# Patient Record
Sex: Female | Born: 1950
Health system: Southern US, Community
[De-identification: ages and names within clinical notes are randomized; demographics above are authoritative.]

## PROBLEM LIST (undated history)

## (undated) DIAGNOSIS — I10 Essential (primary) hypertension: Secondary | ICD-10-CM

## (undated) HISTORY — PX: BREAST SURGERY: SHX581

---

## 1998-06-17 ENCOUNTER — Other Ambulatory Visit: Admission: RE | Admit: 1998-06-17 | Discharge: 1998-06-17 | Payer: Self-pay | Admitting: Obstetrics & Gynecology

## 2001-01-24 ENCOUNTER — Other Ambulatory Visit: Admission: RE | Admit: 2001-01-24 | Discharge: 2001-01-24 | Payer: Self-pay | Admitting: Obstetrics & Gynecology

## 2003-04-10 ENCOUNTER — Other Ambulatory Visit: Admission: RE | Admit: 2003-04-10 | Discharge: 2003-04-10 | Payer: Self-pay | Admitting: Obstetrics & Gynecology

## 2006-10-14 ENCOUNTER — Emergency Department (HOSPITAL_COMMUNITY): Admission: EM | Admit: 2006-10-14 | Discharge: 2006-10-14 | Payer: Self-pay | Admitting: Emergency Medicine

## 2007-03-15 ENCOUNTER — Ambulatory Visit (HOSPITAL_COMMUNITY): Admission: RE | Admit: 2007-03-15 | Discharge: 2007-03-15 | Payer: Self-pay | Admitting: Pulmonary Disease

## 2009-06-24 ENCOUNTER — Ambulatory Visit (HOSPITAL_COMMUNITY): Admission: RE | Admit: 2009-06-24 | Discharge: 2009-06-24 | Payer: Self-pay | Admitting: Family Medicine

## 2010-10-15 NOTE — Procedures (Signed)
NAME:  Jody Hernandez, Jody Hernandez                 ACCOUNT NO.:  [id number]   MEDICAL RECORD NO.:  [id number]          PATIENT TYPE:  OUT   LOCATION:  RAD                           FACILITY:  [facility]   PHYSICIAN:  Edward L. Juanetta Gosling, M.D.DATE OF BIRTH:  09-19-1950   DATE OF PROCEDURE:  03/20/2007  DATE OF DISCHARGE:  03/15/2007                            PULMONARY FUNCTION TEST   1. Spirometry shows a mild ventilatory defect with evidence of airflow      obstruction.  2. Lung volumes are normal.  3. DLCO is mildly reduced.  4. Arterial blood gases are normal.      Edward L. Juanetta Gosling, M.D.  Electronically Signed     [facility]/MEDQ  D:  03/20/2007  T:  03/20/2007  Job:  [id number]

## 2011-03-09 LAB — BLOOD GAS, ARTERIAL
Acid-Base Excess: 2.9 — ABNORMAL HIGH
FIO2: 0.21
O2 Saturation: 96.6
Patient temperature: 37
pO2, Arterial: 84.2

## 2012-04-23 ENCOUNTER — Encounter (INDEPENDENT_AMBULATORY_CARE_PROVIDER_SITE_OTHER): Payer: Self-pay | Admitting: *Deleted

## 2012-05-28 ENCOUNTER — Other Ambulatory Visit (HOSPITAL_COMMUNITY): Payer: Self-pay | Admitting: Family Medicine

## 2012-05-28 DIAGNOSIS — Z139 Encounter for screening, unspecified: Secondary | ICD-10-CM

## 2012-05-31 ENCOUNTER — Ambulatory Visit (HOSPITAL_COMMUNITY)
Admission: RE | Admit: 2012-05-31 | Discharge: 2012-05-31 | Disposition: A | Discharge status: Home / Self Care | Payer: BC Managed Care – PPO | Source: Ambulatory Visit | Attending: Family Medicine | Admitting: Family Medicine

## 2012-05-31 DIAGNOSIS — Z139 Encounter for screening, unspecified: Secondary | ICD-10-CM

## 2012-05-31 DIAGNOSIS — Z1231 Encounter for screening mammogram for malignant neoplasm of breast: Secondary | ICD-10-CM | POA: Insufficient documentation

## 2012-05-31 DIAGNOSIS — R928 Other abnormal and inconclusive findings on diagnostic imaging of breast: Secondary | ICD-10-CM | POA: Insufficient documentation

## 2014-04-23 ENCOUNTER — Other Ambulatory Visit (HOSPITAL_COMMUNITY): Payer: Self-pay | Admitting: Family Medicine

## 2014-04-23 DIAGNOSIS — Z01419 Encounter for gynecological examination (general) (routine) without abnormal findings: Secondary | ICD-10-CM

## 2014-05-01 ENCOUNTER — Other Ambulatory Visit (HOSPITAL_COMMUNITY): Payer: BC Managed Care – PPO

## 2014-05-26 ENCOUNTER — Ambulatory Visit (HOSPITAL_COMMUNITY)
Admission: RE | Admit: 2014-05-26 | Discharge: 2014-05-26 | Disposition: A | Discharge status: Home / Self Care | Payer: BC Managed Care – PPO | Source: Ambulatory Visit | Attending: Family Medicine | Admitting: Family Medicine

## 2014-05-26 DIAGNOSIS — Z13820 Encounter for screening for osteoporosis: Secondary | ICD-10-CM | POA: Diagnosis not present

## 2014-05-26 DIAGNOSIS — Z780 Asymptomatic menopausal state: Secondary | ICD-10-CM | POA: Diagnosis not present

## 2014-05-26 DIAGNOSIS — Z01419 Encounter for gynecological examination (general) (routine) without abnormal findings: Secondary | ICD-10-CM

## 2015-04-22 ENCOUNTER — Other Ambulatory Visit (HOSPITAL_COMMUNITY): Payer: Self-pay | Admitting: Family Medicine

## 2015-04-22 DIAGNOSIS — Z1231 Encounter for screening mammogram for malignant neoplasm of breast: Secondary | ICD-10-CM

## 2015-04-30 ENCOUNTER — Ambulatory Visit (HOSPITAL_COMMUNITY)
Admission: RE | Admit: 2015-04-30 | Discharge: 2015-04-30 | Disposition: A | Discharge status: Home / Self Care | Payer: Medicare Other | Source: Ambulatory Visit | Attending: Family Medicine | Admitting: Family Medicine

## 2015-04-30 DIAGNOSIS — Z1231 Encounter for screening mammogram for malignant neoplasm of breast: Secondary | ICD-10-CM | POA: Insufficient documentation

## 2015-06-11 ENCOUNTER — Encounter (INDEPENDENT_AMBULATORY_CARE_PROVIDER_SITE_OTHER): Payer: Self-pay | Admitting: *Deleted

## 2016-08-01 ENCOUNTER — Other Ambulatory Visit (HOSPITAL_COMMUNITY): Payer: Self-pay | Admitting: Family Medicine

## 2016-08-01 DIAGNOSIS — Z1231 Encounter for screening mammogram for malignant neoplasm of breast: Secondary | ICD-10-CM

## 2016-08-11 ENCOUNTER — Ambulatory Visit (HOSPITAL_COMMUNITY): Payer: BC Managed Care – PPO

## 2016-08-18 ENCOUNTER — Ambulatory Visit (HOSPITAL_COMMUNITY)
Admission: RE | Admit: 2016-08-18 | Discharge: 2016-08-18 | Disposition: A | Discharge status: Home / Self Care | Payer: Medicare Other | Source: Ambulatory Visit | Attending: Family Medicine | Admitting: Family Medicine

## 2016-08-18 DIAGNOSIS — Z1231 Encounter for screening mammogram for malignant neoplasm of breast: Secondary | ICD-10-CM | POA: Diagnosis not present

## 2016-09-01 ENCOUNTER — Encounter (INDEPENDENT_AMBULATORY_CARE_PROVIDER_SITE_OTHER): Payer: Self-pay | Admitting: *Deleted

## 2017-03-08 ENCOUNTER — Inpatient Hospital Stay (HOSPITAL_COMMUNITY)
Admission: EM | Admit: 2017-03-08 | Discharge: 2017-03-11 | DRG: 854 | Disposition: A | Discharge status: Home / Self Care | Payer: Medicare Other | Attending: Internal Medicine | Admitting: Internal Medicine

## 2017-03-08 ENCOUNTER — Encounter (HOSPITAL_COMMUNITY): Payer: Self-pay

## 2017-03-08 ENCOUNTER — Emergency Department (HOSPITAL_COMMUNITY): Payer: Medicare Other

## 2017-03-08 DIAGNOSIS — R1012 Left upper quadrant pain: Secondary | ICD-10-CM | POA: Diagnosis not present

## 2017-03-08 DIAGNOSIS — R000 Tachycardia, unspecified: Secondary | ICD-10-CM | POA: Diagnosis not present

## 2017-03-08 DIAGNOSIS — A4151 Sepsis due to Escherichia coli [E. coli]: Secondary | ICD-10-CM | POA: Diagnosis not present

## 2017-03-08 DIAGNOSIS — N138 Other obstructive and reflux uropathy: Secondary | ICD-10-CM

## 2017-03-08 DIAGNOSIS — A419 Sepsis, unspecified organism: Secondary | ICD-10-CM | POA: Diagnosis present

## 2017-03-08 DIAGNOSIS — N132 Hydronephrosis with renal and ureteral calculous obstruction: Secondary | ICD-10-CM | POA: Diagnosis not present

## 2017-03-08 DIAGNOSIS — N390 Urinary tract infection, site not specified: Secondary | ICD-10-CM

## 2017-03-08 DIAGNOSIS — I10 Essential (primary) hypertension: Secondary | ICD-10-CM | POA: Diagnosis present

## 2017-03-08 DIAGNOSIS — N200 Calculus of kidney: Secondary | ICD-10-CM

## 2017-03-08 HISTORY — DX: Other obstructive and reflux uropathy: N13.8

## 2017-03-08 HISTORY — DX: Essential (primary) hypertension: I10

## 2017-03-08 LAB — COMPREHENSIVE METABOLIC PANEL
ALK PHOS: 84 U/L (ref 38–126)
ALT: 15 U/L (ref 14–54)
ANION GAP: 9 (ref 5–15)
AST: 24 U/L (ref 15–41)
Albumin: 3.4 g/dL — ABNORMAL LOW (ref 3.5–5.0)
BILIRUBIN TOTAL: 1.3 mg/dL — AB (ref 0.3–1.2)
BUN: 22 mg/dL — ABNORMAL HIGH (ref 6–20)
CALCIUM: 9 mg/dL (ref 8.9–10.3)
CO2: 22 mmol/L (ref 22–32)
Chloride: 103 mmol/L (ref 101–111)
Creatinine, Ser: 1.08 mg/dL — ABNORMAL HIGH (ref 0.44–1.00)
GFR, EST AFRICAN AMERICAN: 60 mL/min — AB (ref >60)
GFR, EST NON AFRICAN AMERICAN: 52 mL/min — AB (ref >60)
GLUCOSE: 125 mg/dL — AB (ref 65–99)
POTASSIUM: 3.5 mmol/L (ref 3.5–5.1)
Sodium: 134 mmol/L — ABNORMAL LOW (ref 135–145)
TOTAL PROTEIN: 6.6 g/dL (ref 6.5–8.1)

## 2017-03-08 LAB — URINALYSIS, MICROSCOPIC (REFLEX)

## 2017-03-08 LAB — URINALYSIS, ROUTINE W REFLEX MICROSCOPIC
BILIRUBIN URINE: NEGATIVE
Glucose, UA: NEGATIVE mg/dL
Hgb urine dipstick: NEGATIVE
Ketones, ur: 5 mg/dL — AB
LEUKOCYTES UA: NEGATIVE
NITRITE: NEGATIVE
Protein, ur: 30 mg/dL — AB
SPECIFIC GRAVITY, URINE: 1.02 (ref 1.005–1.030)
pH: 7 (ref 5.0–8.0)

## 2017-03-08 LAB — CBC
HEMATOCRIT: 34.9 % — AB (ref 36.0–46.0)
HEMOGLOBIN: 11.6 g/dL — AB (ref 12.0–15.0)
MCH: 29.9 pg (ref 26.0–34.0)
MCHC: 33.2 g/dL (ref 30.0–36.0)
MCV: 89.9 fL (ref 78.0–100.0)
Platelets: 167 10*3/uL (ref 150–400)
RBC: 3.88 MIL/uL (ref 3.87–5.11)
RDW: 14.3 % (ref 11.5–15.5)
WBC: 15.6 10*3/uL — AB (ref 4.0–10.5)

## 2017-03-08 LAB — LACTIC ACID, PLASMA: Lactic Acid, Venous: 2.1 mmol/L (ref 0.5–1.9)

## 2017-03-08 LAB — LIPASE, BLOOD: Lipase: 27 U/L (ref 11–51)

## 2017-03-08 MED ORDER — DEXTROSE 5 % IV SOLN: 1.0000 g | Freq: Once | INTRAVENOUS | Status: DC

## 2017-03-08 MED ORDER — DEXTROSE 5 % IV SOLN
2.0000 g | Freq: Once | INTRAVENOUS | Status: AC
  Administered 2017-03-08: 2 g via INTRAVENOUS
  Filled 2017-03-08: qty 2

## 2017-03-08 MED ORDER — FENTANYL CITRATE (PF) 100 MCG/2ML IJ SOLN
50.0000 ug | Freq: Once | INTRAMUSCULAR | Status: AC
  Administered 2017-03-08: 50 ug via INTRAVENOUS
  Filled 2017-03-08: qty 2

## 2017-03-08 MED ORDER — SODIUM CHLORIDE 0.9 % IV BOLUS (SEPSIS)
1000.0000 mL | Freq: Once | INTRAVENOUS | Status: AC
  Administered 2017-03-08: 1000 mL via INTRAVENOUS

## 2017-03-08 MED ORDER — ONDANSETRON HCL 4 MG/2ML IJ SOLN
4.0000 mg | Freq: Once | INTRAMUSCULAR | Status: AC
  Administered 2017-03-08: 4 mg via INTRAVENOUS
  Filled 2017-03-08: qty 2

## 2017-03-08 MED ORDER — IOPAMIDOL (ISOVUE-300) INJECTION 61%
100.0000 mL | Freq: Once | INTRAVENOUS | Status: AC | PRN
  Administered 2017-03-08: 100 mL via INTRAVENOUS

## 2017-03-08 NOTE — ED Notes (Signed)
Date and time results received: 03/08/17 2237 (use smartphrase ".now" to insert current time)  Test: lactic acid Critical Value: 2.1  Name of Provider Notified: Dr Rubin Payor  Orders Received? Or Actions Taken?: none told not to call transfer location with results

## 2017-03-08 NOTE — ED Provider Notes (Signed)
[facility]-EMERGENCY DEPT Provider Note   CSN: [id number] Arrival date & time: 03/08/17  1531     History   Chief Complaint Chief Complaint  Patient presents with  . Abdominal Pain    HPI Jody Hernandez is a 66 y.o. female.  HPI Patient presents for abdominal pain. Left upper abdomen. Began last night. Has had some bowel movements today. Some pain with urination. Has had nausea and some vomiting. Pain is dull and constant. Decreased appetite. States she feels bad all over. She has not had pain like this before. No sick contacts. Past Medical History:  Diagnosis Date  . Hypertension     There are no active problems to display for this patient.   Past Surgical History:  Procedure Laterality Date  . BREAST SURGERY     lumpectomy    OB History    No data available       Home Medications    Prior to Admission medications   Medication Sig Start Date End Date Taking? Authorizing Provider  ibuprofen (ADVIL,MOTRIN) 200 MG tablet Take 200 mg by mouth every 6 (six) hours as needed for mild pain or moderate pain.   Yes [provider]  lisinopril (PRINIVIL,ZESTRIL) 20 MG tablet Take 20 mg by mouth daily.   Yes [provider]    Family History No family history on file.  Social History Social History  Substance Use Topics  . Smoking status: Never Smoker  . Smokeless tobacco: Never Used  . Alcohol use No     Allergies   Patient has no known allergies.   Review of Systems Review of Systems  Constitutional: Positive for appetite change and chills.  HENT: Negative for congestion.   Respiratory: Negative for shortness of breath.   Gastrointestinal: Positive for abdominal pain, nausea and vomiting.  Genitourinary: Positive for dysuria. Negative for flank pain.  Musculoskeletal: Positive for myalgias. Negative for back pain.  Skin: Negative for rash.  Neurological: Negative for seizures.  Hematological: Negative for adenopathy.    Psychiatric/Behavioral: Negative for confusion.     Physical Exam Updated Vital Signs BP 137/90   Pulse (!) 111   Temp (!) 101.6 F (38.7 C) (Rectal)   Resp (!) 24   Ht  (1.626 m)   Wt 81.6 kg (180 lb)   SpO2 93%   BMI 30.90 kg/m   Physical Exam  Constitutional: She appears well-developed.  HENT:  Head: Normocephalic.  Eyes: EOM are normal.  Neck: Neck supple.  Cardiovascular:  tachycardia  Pulmonary/Chest: No respiratory distress. She has no wheezes. She has no rales.  Abdominal: There is tenderness.  Left upper quadrant tenderness without rebound or guarding.  Musculoskeletal: She exhibits no edema.  Neurological: She is alert.  Skin: Skin is warm. Capillary refill takes less than 2 seconds.  Psychiatric: She has a normal mood and affect.     ED Treatments / Results  Labs (all labs ordered are listed, but only abnormal results are displayed) Labs Reviewed  COMPREHENSIVE METABOLIC PANEL - Abnormal; Notable for the following:       Result Value   Sodium 134 (*)    Glucose, Bld 125 (*)    BUN 22 (*)    Creatinine, Ser 1.08 (*)    Albumin 3.4 (*)    Total Bilirubin 1.3 (*)    GFR calc non Af Amer 52 (*)    GFR calc Af Amer 60 (*)    All other components within normal limits  CBC -  Abnormal; Notable for the following:    WBC 15.6 (*)    Hemoglobin 11.6 (*)    HCT 34.9 (*)    All other components within normal limits  URINALYSIS, ROUTINE W REFLEX MICROSCOPIC - Abnormal; Notable for the following:    Ketones, ur 5 (*)    Protein, ur 30 (*)    All other components within normal limits  URINALYSIS, MICROSCOPIC (REFLEX) - Abnormal; Notable for the following:    Bacteria, UA RARE (*)    Squamous Epithelial / LPF 0-5 (*)    All other components within normal limits  URINE CULTURE  CULTURE, BLOOD (ROUTINE X 2)  CULTURE, BLOOD (ROUTINE X 2)  LIPASE, BLOOD  LACTIC ACID, PLASMA  LACTIC ACID, PLASMA    EKG  EKG Interpretation  Date/Time:  Wednesday  March 08 2017 15:56:33 EDT Ventricular Rate:  111 PR Interval:    QRS Duration: 91 QT Interval:  323 QTC Calculation: 439 R Axis:   69 Text Interpretation:  Sinus tachycardia Confirmed by Benjiman Core [phone]) on 03/08/2017 4:21:04 PM       Radiology Ct Abdomen Pelvis W Contrast  Result Date: 03/08/2017 CLINICAL DATA:  Upper to lower LEFT side abdominal pain, some burning with urination EXAM: CT ABDOMEN AND PELVIS WITH CONTRAST TECHNIQUE: Multidetector CT imaging of the abdomen and pelvis was performed using the standard protocol following bolus administration of intravenous contrast. Sagittal and coronal MPR images reconstructed from axial data set. CONTRAST:  ISOVUE-300 IOPAMIDOL (ISOVUE-300) INJECTION 61% IV. No oral contrast. COMPARISON:  None FINDINGS: Lower chest: Bibasilar atelectasis Hepatobiliary: Distended gallbladder. Liver unremarkable. No biliary dilatation. Pancreas: Normal appearance Spleen: Normal appearance though mild perisplenic fluid is seen Adrenals/Urinary Tract: Adrenal glands thickened without discrete mass. Significant LEFT hydronephrosis and hydroureter terminating at a 7 mm distal LEFT ureteral calculus image 79. Significant perinephric and peripelvic edema with edema extending throughout the LEFT anterior pararenal space laterally to the proximal descending colon and perisplenic. Cannot exclude 4 axial rupture though no definite contrast extravasation is seen on delayed images. Significant delay in LEFT nephrogram consistent with impaired renal function. Tiny nonobstructing inferior pole RIGHT renal calculus. RIGHT kidney and ureter otherwise normal appearance. Bladder unremarkable. Stomach/Bowel: Redundant cecum. Normal appendix. Slight prominent stool in rectum and at the distal transverse colon/splenic flexure. Moderate-sized hiatal hernia. Stomach and bowel loops otherwise unremarkable. Vascular/Lymphatic: Aorta normal caliber. Few pelvic phleboliths. No  adenopathy. Reproductive: Unremarkable uterus and LEFT ovary. RIGHT ovarian cyst 3.4 x 3.1 cm image 75. Other: No additional ascites Musculoskeletal: Demineralized IMPRESSION: Marked LEFT hydronephrosis and hydroureter secondary to a 2 mm distal LEFT ureteral calculus. Significant perinephric edema extending into anterior pararenal space laterally to the proximal descending colon and perisplenic region, cannot exclude appendiceal rupture. Evidence of impaired LEFT renal function. Tiny nonobstructing RIGHT renal calculus. Moderate-sized hiatal hernia. Electronically Signed   By: Ulyses Southward M.D.   On: 03/08/2017 19:53    Procedures Procedures (including critical care time)  Medications Ordered in ED Medications  sodium chloride 0.9 % bolus 1,000 mL (not administered)  cefTRIAXone (ROCEPHIN) 2 g in dextrose 5 % 50 mL IVPB (not administered)  sodium chloride 0.9 % bolus 1,000 mL (0 mLs Intravenous Stopped 03/08/17 1942)  ondansetron (ZOFRAN) injection 4 mg (4 mg Intravenous Given 03/08/17 1644)  fentaNYL (SUBLIMAZE) injection 50 mcg (50 mcg Intravenous Given 03/08/17 1759)  iopamidol (ISOVUE-300) 61 % injection 100 mL (100 mLs Intravenous Contrast Given 03/08/17 1920)     Initial Impression / Assessment and  Plan / ED Course  I have reviewed the triage vital signs and the nursing notes.  Pertinent labs & imaging results that were available during my care of the patient were reviewed by me and considered in my medical decision making (see chart for details).    Patient presented with left abdominal and flank pain. Has had some tachycardia and on rectal temperature was found to have a fever. Not hypotensive. Urinalysis shows white cells but not otherwise a clear infection but CT scan shows large distal ureteral stone with hydronephrosis and possible calyceal rupture. Discussed with Dr. Sherron Monday who requested transfer to the [facility] ER for emergent stenting. He said start a code sepsis. Patient  had antibiotics given and blood cultures were added. Lactic acidadded. Also discussed with Dr. Effie Shy in the ER.   CRITICAL CARE Performed by: Billee Cashing Total critical care time: 30 minutes Critical care time was exclusive of separately billable procedures and treating other patients. Critical care was necessary to treat or prevent imminent or life-threatening deterioration. Critical care was time spent personally by me on the following activities: development of treatment plan with patient and/or surrogate as well as nursing, discussions with consultants, evaluation of patient's response to treatment, examination of patient, obtaining history from patient or surrogate, ordering and performing treatments and interventions, ordering and review of laboratory studies, ordering and review of radiographic studies, pulse oximetry and re-evaluation of patient's condition.   Final Clinical Impressions(s) / ED Diagnoses   Final diagnoses:  Urinary tract infection without hematuria, site unspecified  Ureteral stone with hydronephrosis    New Prescriptions New Prescriptions   No medications on file     Benjiman Core, MD 03/08/17 2030

## 2017-03-08 NOTE — ED Notes (Signed)
Pt requesting something for pain, EDP made aware and orders given for pain medication

## 2017-03-08 NOTE — ED Notes (Signed)
Report given to Daleen Bo at [facility] ED

## 2017-03-08 NOTE — ED Notes (Signed)
Pt's husband at bedside, given numbers for [facility] ED and [facility] hospital; waiting for [facility] arrival

## 2017-03-08 NOTE — Consult Note (Signed)
Urology Consult  Referring physician: Madison Hickman Reason for referral: septic stone  Chief Complaint: septic stone  History of Present Illness: left abdominal pain and fever; pain with urine; nausea; code sepsis started; no GU surgery stones or UTI history; pain bit more on left but diffuse lower  Cr 1/08 WBC 15.6 CT: 97m distal stone with significant hydro; much edema noted around left kidney;   Modifying factors: There are no other modifying factors  Associated signs and symptoms: There are no other associated signs and symptoms Aggravating and relieving factors: There are no other aggravating or relieving factors Severity: Moderate Duration: Persistent   Past Medical History:  Diagnosis Date  . Hypertension    Past Surgical History:  Procedure Laterality Date  . BREAST SURGERY     lumpectomy    Medications: I have reviewed the patient's current medications. Allergies: No Known Allergies  No family history on file. Social History:  reports that she has never smoked. She has never used smokeless tobacco. She reports that she does not drink alcohol or use drugs.  ROS: All systems are reviewed and negative except as noted. Rest negative  Physical Exam:  Vital signs in last 24 hours: Temp:  [99.3 F (37.4 C)-101.6 F (38.7 C)] 99.6 F (37.6 C) (10/10 2103) Pulse Rate:  [105-124] 107 (10/10 2130) Resp:  [16-28] 26 (10/10 2130) BP: (130-152)/(79-97) 152/93 (10/10 2130) SpO2:  [89 %-96 %] 96 % (10/10 2130) Weight:  [81.6 kg (180 lb)] 81.6 kg (180 lb) (10/10 1537)  Cardiovascular: Skin warm; not flushed Respiratory: Breaths quiet; no shortness of breath Abdomen: No masses Neurological: Normal sensation to touch Musculoskeletal: Normal motor function arms and legs Lymphatics: No inguinal adenopathy Skin: No rashes Genitourinary:dehydrated Benign belly  Laboratory Data:  Results for orders placed or performed during the hospital encounter of 03/08/17 (from the past 72  hour(s))  Urinalysis, Routine w reflex microscopic     Status: Abnormal   Collection Time: 03/08/17  3:40 PM  Result Value Ref Range   Color, Urine YELLOW YELLOW   APPearance CLEAR CLEAR   Specific Gravity, Urine 1.020 1.005 - 1.030   pH 7.0 5.0 - 8.0   Glucose, UA NEGATIVE NEGATIVE mg/dL   Hgb urine dipstick NEGATIVE NEGATIVE   Bilirubin Urine NEGATIVE NEGATIVE   Ketones, ur 5 (A) NEGATIVE mg/dL   Protein, ur 30 (A) NEGATIVE mg/dL   Nitrite NEGATIVE NEGATIVE   Leukocytes, UA NEGATIVE NEGATIVE  Urinalysis, Microscopic (reflex)     Status: Abnormal   Collection Time: 03/08/17  3:40 PM  Result Value Ref Range   RBC / HPF 0-5 0 - 5 RBC/hpf   WBC, UA 6-30 0 - 5 WBC/hpf   Bacteria, UA RARE (A) NONE SEEN   Squamous Epithelial / LPF 0-5 (A) NONE SEEN  Lipase, blood     Status: None   Collection Time: 03/08/17  4:08 PM  Result Value Ref Range   Lipase 27 11 - 51 U/L  Comprehensive metabolic panel     Status: Abnormal   Collection Time: 03/08/17  4:08 PM  Result Value Ref Range   Sodium 134 (L) 135 - 145 mmol/L   Potassium 3.5 3.5 - 5.1 mmol/L   Chloride 103 101 - 111 mmol/L   CO2 22 22 - 32 mmol/L   Glucose, Bld 125 (H) 65 - 99 mg/dL   BUN 22 (H) 6 - 20 mg/dL   Creatinine, Ser 1.08 (H) 0.44 - 1.00 mg/dL   Calcium 9.0 8.9 -  10.3 mg/dL   Total Protein 6.6 6.5 - 8.1 g/dL   Albumin 3.4 (L) 3.5 - 5.0 g/dL   AST 24 15 - 41 U/L   ALT 15 14 - 54 U/L   Alkaline Phosphatase 84 38 - 126 U/L   Total Bilirubin 1.3 (H) 0.3 - 1.2 mg/dL   GFR calc non Af Amer 52 (L) >60 mL/min   GFR calc Af Amer 60 (L) >60 mL/min    Comment: (NOTE) The eGFR has been calculated using the CKD EPI equation. This calculation has not been validated in all clinical situations. eGFR's persistently <60 mL/min signify possible Chronic Kidney Disease.    Anion gap 9 5 - 15  CBC     Status: Abnormal   Collection Time: 03/08/17  4:08 PM  Result Value Ref Range   WBC 15.6 (H) 4.0 - 10.5 K/uL   RBC 3.88 3.87 - 5.11  MIL/uL   Hemoglobin 11.6 (L) 12.0 - 15.0 g/dL   HCT 34.9 (L) 36.0 - 46.0 %   MCV 89.9 78.0 - 100.0 fL   MCH 29.9 26.0 - 34.0 pg   MCHC 33.2 30.0 - 36.0 g/dL   RDW 14.3 11.5 - 15.5 %   Platelets 167 150 - 400 K/uL  Lactic acid, plasma     Status: Abnormal   Collection Time: 03/08/17  8:59 PM  Result Value Ref Range   Lactic Acid, Venous 2.1 (HH) 0.5 - 1.9 mmol/L    Comment: CRITICAL RESULT CALLED TO, READ BACK BY AND VERIFIED WITH: [place],C'@2233'$  BY MATTHEWS, B 10.10.18    No results found for this or any previous visit (from the past 240 hour(s)). Creatinine:  Recent Labs  03/08/17 1608  CREATININE 1.08*    Xrays: See report/chart As above  Impression/Assessment:  Left ureter stone and fever  Plan:  Septic protocol and stent; pros and cons and risks and sequelae- perc sepsis etc  After a thorough review of the management options for the patient's condition the patient  elected to proceed with surgical therapy as noted above. We have discussed the potential benefits and risks of the procedure, side effects of the proposed treatment, the likelihood of the patient achieving the goals of the procedure, and any potential problems that might occur during the procedure or recuperation. Informed consent has been obtained.  Jody Hernandez A 03/08/2017, 11:22 PM

## 2017-03-08 NOTE — ED Notes (Signed)
Pt assisted to bathroom and back to bed, urine specimen obtained and sent to lab 

## 2017-03-08 NOTE — ED Triage Notes (Signed)
Pt c/o left sided abd pain.  Says has had several BMs today but says hasn't been diarrhea.  Pt say it burns a little when she voids.

## 2017-03-09 ENCOUNTER — Emergency Department (HOSPITAL_COMMUNITY): Payer: Medicare Other | Admitting: Registered Nurse

## 2017-03-09 ENCOUNTER — Encounter (HOSPITAL_COMMUNITY): Payer: Self-pay | Admitting: Family Medicine

## 2017-03-09 ENCOUNTER — Encounter (HOSPITAL_COMMUNITY)
Admission: EM | Disposition: A | Discharge status: Home / Self Care | Payer: Self-pay | Source: Home / Self Care | Attending: Urology

## 2017-03-09 ENCOUNTER — Inpatient Hospital Stay (HOSPITAL_COMMUNITY): Payer: Medicare Other

## 2017-03-09 DIAGNOSIS — A4151 Sepsis due to Escherichia coli [E. coli]: Secondary | ICD-10-CM | POA: Diagnosis present

## 2017-03-09 DIAGNOSIS — N132 Hydronephrosis with renal and ureteral calculous obstruction: Secondary | ICD-10-CM

## 2017-03-09 DIAGNOSIS — A419 Sepsis, unspecified organism: Secondary | ICD-10-CM | POA: Diagnosis present

## 2017-03-09 DIAGNOSIS — I10 Essential (primary) hypertension: Secondary | ICD-10-CM | POA: Diagnosis present

## 2017-03-09 DIAGNOSIS — N138 Other obstructive and reflux uropathy: Secondary | ICD-10-CM

## 2017-03-09 DIAGNOSIS — N200 Calculus of kidney: Secondary | ICD-10-CM | POA: Diagnosis not present

## 2017-03-09 DIAGNOSIS — R1012 Left upper quadrant pain: Secondary | ICD-10-CM | POA: Diagnosis present

## 2017-03-09 DIAGNOSIS — N390 Urinary tract infection, site not specified: Secondary | ICD-10-CM | POA: Diagnosis not present

## 2017-03-09 DIAGNOSIS — R000 Tachycardia, unspecified: Secondary | ICD-10-CM | POA: Diagnosis present

## 2017-03-09 HISTORY — PX: CYSTOSCOPY WITH STENT PLACEMENT: SHX5790

## 2017-03-09 LAB — BLOOD CULTURE ID PANEL (REFLEXED)
Acinetobacter baumannii: NOT DETECTED
CANDIDA TROPICALIS: NOT DETECTED
CARBAPENEM RESISTANCE: NOT DETECTED
Candida albicans: NOT DETECTED
Candida glabrata: NOT DETECTED
Candida krusei: NOT DETECTED
Candida parapsilosis: NOT DETECTED
ENTEROBACTERIACEAE SPECIES: DETECTED — AB
Enterobacter cloacae complex: NOT DETECTED
Enterococcus species: NOT DETECTED
Escherichia coli: DETECTED — AB
HAEMOPHILUS INFLUENZAE: NOT DETECTED
KLEBSIELLA PNEUMONIAE: NOT DETECTED
Klebsiella oxytoca: NOT DETECTED
Listeria monocytogenes: NOT DETECTED
Neisseria meningitidis: NOT DETECTED
PROTEUS SPECIES: NOT DETECTED
Pseudomonas aeruginosa: NOT DETECTED
SERRATIA MARCESCENS: NOT DETECTED
STAPHYLOCOCCUS AUREUS BCID: NOT DETECTED
STAPHYLOCOCCUS SPECIES: NOT DETECTED
STREPTOCOCCUS SPECIES: NOT DETECTED
Streptococcus agalactiae: NOT DETECTED
Streptococcus pneumoniae: NOT DETECTED
Streptococcus pyogenes: NOT DETECTED

## 2017-03-09 LAB — CBC WITH DIFFERENTIAL/PLATELET
Basophils Absolute: 0 10*3/uL (ref 0.0–0.1)
Basophils Relative: 0 %
EOS ABS: 0 10*3/uL (ref 0.0–0.7)
EOS PCT: 0 %
HEMATOCRIT: 32.5 % — AB (ref 36.0–46.0)
Hemoglobin: 10.7 g/dL — ABNORMAL LOW (ref 12.0–15.0)
LYMPHS ABS: 0.4 10*3/uL — AB (ref 0.7–4.0)
Lymphocytes Relative: 2 %
MCH: 29.9 pg (ref 26.0–34.0)
MCHC: 32.9 g/dL (ref 30.0–36.0)
MCV: 90.8 fL (ref 78.0–100.0)
MONOS PCT: 1 %
Monocytes Absolute: 0.2 10*3/uL (ref 0.1–1.0)
Neutro Abs: 16.2 10*3/uL — ABNORMAL HIGH (ref 1.7–7.7)
Neutrophils Relative %: 97 %
PLATELETS: 151 10*3/uL (ref 150–400)
RBC: 3.58 MIL/uL — ABNORMAL LOW (ref 3.87–5.11)
RDW: 14.8 % (ref 11.5–15.5)
WBC Morphology: INCREASED
WBC: 16.7 10*3/uL — ABNORMAL HIGH (ref 4.0–10.5)

## 2017-03-09 LAB — COMPREHENSIVE METABOLIC PANEL
ALT: 16 U/L (ref 14–54)
AST: 26 U/L (ref 15–41)
Albumin: 2.9 g/dL — ABNORMAL LOW (ref 3.5–5.0)
Alkaline Phosphatase: 59 U/L (ref 38–126)
Anion gap: 8 (ref 5–15)
BILIRUBIN TOTAL: 0.7 mg/dL (ref 0.3–1.2)
BUN: 19 mg/dL (ref 6–20)
CHLORIDE: 105 mmol/L (ref 101–111)
CO2: 21 mmol/L — ABNORMAL LOW (ref 22–32)
CREATININE: 1.1 mg/dL — AB (ref 0.44–1.00)
Calcium: 8.1 mg/dL — ABNORMAL LOW (ref 8.9–10.3)
GFR, EST AFRICAN AMERICAN: 58 mL/min — AB (ref >60)
GFR, EST NON AFRICAN AMERICAN: 50 mL/min — AB (ref >60)
Glucose, Bld: 110 mg/dL — ABNORMAL HIGH (ref 65–99)
POTASSIUM: 3.9 mmol/L (ref 3.5–5.1)
Sodium: 134 mmol/L — ABNORMAL LOW (ref 135–145)
TOTAL PROTEIN: 6 g/dL — AB (ref 6.5–8.1)

## 2017-03-09 LAB — LACTIC ACID, PLASMA
LACTIC ACID, VENOUS: 1.2 mmol/L (ref 0.5–1.9)
LACTIC ACID, VENOUS: 1.2 mmol/L (ref 0.5–1.9)
Lactic Acid, Venous: 2.3 mmol/L (ref 0.5–1.9)

## 2017-03-09 LAB — GLUCOSE, CAPILLARY: Glucose-Capillary: 120 mg/dL — ABNORMAL HIGH (ref 65–99)

## 2017-03-09 SURGERY — CYSTOSCOPY, WITH STENT INSERTION
Anesthesia: General | Laterality: Left

## 2017-03-09 MED ORDER — ARTIFICIAL TEARS OPHTHALMIC OINT
TOPICAL_OINTMENT | OPHTHALMIC | Status: AC
  Filled 2017-03-09: qty 3.5

## 2017-03-09 MED ORDER — MORPHINE SULFATE (PF) 4 MG/ML IV SOLN: 2.0000 mg | INTRAVENOUS | Status: DC | PRN

## 2017-03-09 MED ORDER — ONDANSETRON HCL 4 MG/2ML IJ SOLN
INTRAMUSCULAR | Status: DC | PRN
  Administered 2017-03-09: 4 mg via INTRAVENOUS

## 2017-03-09 MED ORDER — ACETAMINOPHEN 10 MG/ML IV SOLN
INTRAVENOUS | Status: AC
  Filled 2017-03-09: qty 100

## 2017-03-09 MED ORDER — LACTATED RINGERS IV SOLN
INTRAVENOUS | Status: DC | PRN
  Administered 2017-03-09: via INTRAVENOUS

## 2017-03-09 MED ORDER — SUCCINYLCHOLINE CHLORIDE 20 MG/ML IJ SOLN
INTRAMUSCULAR | Status: DC | PRN
  Administered 2017-03-09: 120 mg via INTRAVENOUS

## 2017-03-09 MED ORDER — PROPOFOL 10 MG/ML IV BOLUS
INTRAVENOUS | Status: AC
  Filled 2017-03-09: qty 20

## 2017-03-09 MED ORDER — DEXAMETHASONE SODIUM PHOSPHATE 10 MG/ML IJ SOLN
INTRAMUSCULAR | Status: AC
  Filled 2017-03-09: qty 1

## 2017-03-09 MED ORDER — PROPOFOL 10 MG/ML IV BOLUS
INTRAVENOUS | Status: DC | PRN
  Administered 2017-03-09: 140 mg via INTRAVENOUS

## 2017-03-09 MED ORDER — MIDAZOLAM HCL 5 MG/5ML IJ SOLN
INTRAMUSCULAR | Status: DC | PRN
  Administered 2017-03-09: 0.5 mg via INTRAVENOUS

## 2017-03-09 MED ORDER — LACTATED RINGERS IV SOLN: INTRAVENOUS | Status: DC

## 2017-03-09 MED ORDER — IOHEXOL 300 MG/ML  SOLN
INTRAMUSCULAR | Status: DC | PRN
  Administered 2017-03-09: 4 mL

## 2017-03-09 MED ORDER — PHENYLEPHRINE 40 MCG/ML (10ML) SYRINGE FOR IV PUSH (FOR BLOOD PRESSURE SUPPORT)
PREFILLED_SYRINGE | INTRAVENOUS | Status: AC
  Filled 2017-03-09: qty 10

## 2017-03-09 MED ORDER — SODIUM CHLORIDE 0.9% FLUSH
3.0000 mL | Freq: Two times a day (BID) | INTRAVENOUS | Status: DC
  Administered 2017-03-09 – 2017-03-11 (×5): 3 mL via INTRAVENOUS

## 2017-03-09 MED ORDER — HYDROCODONE-ACETAMINOPHEN 5-325 MG PO TABS: 1.0000 | ORAL_TABLET | ORAL | Status: DC | PRN

## 2017-03-09 MED ORDER — FENTANYL CITRATE (PF) 100 MCG/2ML IJ SOLN
INTRAMUSCULAR | Status: DC | PRN
  Administered 2017-03-09: 50 ug via INTRAVENOUS
  Administered 2017-03-09: 100 ug via INTRAVENOUS
  Administered 2017-03-09: 50 ug via INTRAVENOUS

## 2017-03-09 MED ORDER — METOCLOPRAMIDE HCL 5 MG/ML IJ SOLN: 10.0000 mg | Freq: Once | INTRAMUSCULAR | Status: DC | PRN

## 2017-03-09 MED ORDER — MIDAZOLAM HCL 2 MG/2ML IJ SOLN
INTRAMUSCULAR | Status: AC
  Filled 2017-03-09: qty 2

## 2017-03-09 MED ORDER — HYDROMORPHONE HCL-NACL 0.5-0.9 MG/ML-% IV SOSY
0.5000 mg | PREFILLED_SYRINGE | INTRAVENOUS | Status: DC | PRN
Start: 2017-03-09 — End: 2017-03-11

## 2017-03-09 MED ORDER — CIPROFLOXACIN IN D5W 400 MG/200ML IV SOLN
INTRAVENOUS | Status: AC
  Filled 2017-03-09: qty 200

## 2017-03-09 MED ORDER — CIPROFLOXACIN IN D5W 400 MG/200ML IV SOLN
INTRAVENOUS | Status: DC | PRN
  Administered 2017-03-09: 400 mg via INTRAVENOUS

## 2017-03-09 MED ORDER — SODIUM CHLORIDE 0.9 % IV SOLN
INTRAVENOUS | Status: AC
  Administered 2017-03-09: 02:00:00 via INTRAVENOUS

## 2017-03-09 MED ORDER — LIDOCAINE 2% (20 MG/ML) 5 ML SYRINGE
INTRAMUSCULAR | Status: AC
  Filled 2017-03-09: qty 5

## 2017-03-09 MED ORDER — ACETAMINOPHEN 650 MG RE SUPP: 650.0000 mg | Freq: Four times a day (QID) | RECTAL | Status: DC | PRN

## 2017-03-09 MED ORDER — SODIUM CHLORIDE 0.9 % IR SOLN
Status: DC | PRN
  Administered 2017-03-09: 3000 mL

## 2017-03-09 MED ORDER — ACETAMINOPHEN 325 MG PO TABS
650.0000 mg | ORAL_TABLET | Freq: Four times a day (QID) | ORAL | Status: DC | PRN
  Administered 2017-03-11: 650 mg via ORAL
  Filled 2017-03-09: qty 2

## 2017-03-09 MED ORDER — SUCCINYLCHOLINE CHLORIDE 200 MG/10ML IV SOSY
PREFILLED_SYRINGE | INTRAVENOUS | Status: AC
  Filled 2017-03-09: qty 10

## 2017-03-09 MED ORDER — ACETAMINOPHEN 10 MG/ML IV SOLN
INTRAVENOUS | Status: DC | PRN
  Administered 2017-03-09: 1000 mg via INTRAVENOUS

## 2017-03-09 MED ORDER — MEPERIDINE HCL 50 MG/ML IJ SOLN: 6.2500 mg | INTRAMUSCULAR | Status: DC | PRN

## 2017-03-09 MED ORDER — DEXTROSE 5 % IV SOLN
2.0000 g | INTRAVENOUS | Status: DC
  Administered 2017-03-09 – 2017-03-10 (×2): 2 g via INTRAVENOUS
  Filled 2017-03-09 (×3): qty 2

## 2017-03-09 MED ORDER — DEXAMETHASONE SODIUM PHOSPHATE 10 MG/ML IJ SOLN
INTRAMUSCULAR | Status: DC | PRN
  Administered 2017-03-09: 10 mg via INTRAVENOUS

## 2017-03-09 MED ORDER — HYDROMORPHONE HCL 1 MG/ML IJ SOLN: 0.5000 mg | INTRAMUSCULAR | Status: DC | PRN

## 2017-03-09 MED ORDER — ONDANSETRON HCL 4 MG/2ML IJ SOLN: 4.0000 mg | Freq: Four times a day (QID) | INTRAMUSCULAR | Status: DC | PRN

## 2017-03-09 MED ORDER — ONDANSETRON HCL 4 MG/2ML IJ SOLN
INTRAMUSCULAR | Status: AC
  Filled 2017-03-09: qty 2

## 2017-03-09 MED ORDER — CIPROFLOXACIN IN D5W 400 MG/200ML IV SOLN
400.0000 mg | Freq: Two times a day (BID) | INTRAVENOUS | Status: DC
  Administered 2017-03-09 (×2): 400 mg via INTRAVENOUS
  Filled 2017-03-09 (×2): qty 200

## 2017-03-09 MED ORDER — LIDOCAINE HCL (CARDIAC) 20 MG/ML IV SOLN
INTRAVENOUS | Status: DC | PRN
  Administered 2017-03-09: 25 mg via INTRATRACHEAL
  Administered 2017-03-09: 75 mg via INTRAVENOUS

## 2017-03-09 MED ORDER — ONDANSETRON HCL 4 MG PO TABS: 4.0000 mg | ORAL_TABLET | Freq: Four times a day (QID) | ORAL | Status: DC | PRN

## 2017-03-09 MED ORDER — SENNOSIDES-DOCUSATE SODIUM 8.6-50 MG PO TABS: 1.0000 | ORAL_TABLET | Freq: Every evening | ORAL | Status: DC | PRN

## 2017-03-09 MED ORDER — FENTANYL CITRATE (PF) 100 MCG/2ML IJ SOLN
25.0000 ug | INTRAMUSCULAR | Status: DC | PRN
Start: 2017-03-09 — End: 2017-03-09

## 2017-03-09 MED ORDER — FENTANYL CITRATE (PF) 250 MCG/5ML IJ SOLN
INTRAMUSCULAR | Status: AC
  Filled 2017-03-09: qty 5

## 2017-03-09 SURGICAL SUPPLY — 13 items
BAG URO CATCHER STRL LF (MISCELLANEOUS) ×3 IMPLANT
CATH INTERMIT  6FR 70CM (CATHETERS) ×3 IMPLANT
CLOTH BEACON ORANGE TIMEOUT ST (SAFETY) ×3 IMPLANT
COVER FOOTSWITCH UNIV (MISCELLANEOUS) IMPLANT
COVER SURGICAL LIGHT HANDLE (MISCELLANEOUS) ×3 IMPLANT
GLOVE BIOGEL M STRL SZ7.5 (GLOVE) ×3 IMPLANT
GOWN STRL REUS W/TWL LRG LVL3 (GOWN DISPOSABLE) ×6 IMPLANT
GUIDEWIRE STR DUAL SENSOR (WIRE) ×3 IMPLANT
MANIFOLD NEPTUNE II (INSTRUMENTS) ×3 IMPLANT
PACK CYSTO (CUSTOM PROCEDURE TRAY) ×3 IMPLANT
STENT URET 6FRX24 CONTOUR (STENTS) ×2 IMPLANT
TUBING CONNECTING 10 (TUBING) ×1 IMPLANT
TUBING CONNECTING 10' (TUBING) ×1

## 2017-03-09 NOTE — Progress Notes (Signed)
PHARMACY NOTE -  ANTIBIOTIC RENAL DOSE ADJUSTMENT   Request received for Pharmacy to assist with antibiotic renal dose adjustment.  Patient has been initiated on Ceftriaxone 2gm iv q24hr for UTI. SCr 1.08, estimated CrCl 51 ml/min Current dosage is appropriate and need for further dosage adjustment appears unlikely at present. Will sign off at this time.  Please reconsult if a change in clinical status warrants re-evaluation of dosage.

## 2017-03-09 NOTE — Anesthesia Preprocedure Evaluation (Signed)
Anesthesia Evaluation  Patient identified by MRN, date of birth, ID band Patient awake    Reviewed: Allergy & Precautions, NPO status , Patient's Chart, lab work & pertinent test results  Airway Mallampati: II  TM Distance: >3 FB Neck ROM: Full    Dental no notable dental hx.    Pulmonary neg pulmonary ROS,    Pulmonary exam normal breath sounds clear to auscultation       Cardiovascular hypertension, Pt. on medications Normal cardiovascular exam Rhythm:Regular Rate:Normal     Neuro/Psych negative neurological ROS  negative psych ROS   GI/Hepatic negative GI ROS, Neg liver ROS,   Endo/Other  negative endocrine ROS  Renal/GU Renal disease  negative genitourinary   Musculoskeletal negative musculoskeletal ROS (+)   Abdominal   Peds negative pediatric ROS (+)  Hematology negative hematology ROS (+)   Anesthesia Other Findings   Reproductive/Obstetrics negative OB ROS                             Anesthesia Physical Anesthesia Plan  ASA: II  Anesthesia Plan: General   Post-op Pain Management:    Induction: Intravenous  PONV Risk Score and Plan: 4 or greater and Ondansetron and Treatment may vary due to age or medical condition  Airway Management Planned: Oral ETT  Additional Equipment:   Intra-op Plan:   Post-operative Plan: Extubation in OR  Informed Consent: I have reviewed the patients History and Physical, chart, labs and discussed the procedure including the risks, benefits and alternatives for the proposed anesthesia with the patient or authorized representative who has indicated his/her understanding and acceptance.   Dental advisory given  Plan Discussed with: CRNA  Anesthesia Plan Comments:         Anesthesia Quick Evaluation

## 2017-03-09 NOTE — Care Management Note (Signed)
Case Management Note  Patient Details  Name: Jody Hernandez MRN: [id number] Date of Birth: 12-Aug-1950  Subjective/Objective:66 y/o f admitted w/Sepsis. From home.                    Action/Plan:d/c plan home.   Expected Discharge Date:                  Expected Discharge Plan:  Home/Self Care  In-House Referral:     Discharge planning Services  CM Consult  Post Acute Care Choice:    Choice offered to:     DME Arranged:    DME Agency:     HH Arranged:    HH Agency:     Status of Service:  In process, will continue to follow  If discussed at Long Length of Stay Meetings, dates discussed:    Additional Comments:  Lanier Clam, RN 03/09/2017, 2:34 PM

## 2017-03-09 NOTE — Progress Notes (Signed)
Looks good No pain Stent ok Pending c/s

## 2017-03-09 NOTE — Transfer of Care (Signed)
Immediate Anesthesia Transfer of Care Note  Patient: Jody Hernandez  Procedure(s) Performed: CYSTOSCOPY, RETROGRADE PYELOGRAM WITH STENT PLACEMENT (Left )  Patient Location: PACU  Anesthesia Type:General  Level of Consciousness: awake, alert , oriented and patient cooperative  Airway & Oxygen Therapy: Patient Spontanous Breathing and Patient connected to face mask oxygen  Post-op Assessment: Report given to RN, Post -op Vital signs reviewed and stable and Patient moving all extremities X 4  Post vital signs: stable  Last Vitals:  Vitals:   03/08/17 2103 03/08/17 2130  BP: (!) 152/93 (!) 152/93  Pulse: (!) 107 (!) 107  Resp: 20 (!) 26  Temp: 37.6 C   SpO2: 96% 96%    Last Pain:  Vitals:   03/08/17 1942  TempSrc:   PainSc: 6          Complications: No apparent anesthesia complications

## 2017-03-09 NOTE — Anesthesia Procedure Notes (Addendum)
Procedure Name: Intubation Date/Time: 03/09/2017 12:32 AM Performed by: Lissa Morales Pre-anesthesia Checklist: Patient identified, Emergency Drugs available, Suction available and Patient being monitored Patient Re-evaluated:Patient Re-evaluated prior to induction Oxygen Delivery Method: Circle system utilized Preoxygenation: Pre-oxygenation with 100% oxygen Induction Type: IV induction, Rapid sequence and Cricoid Pressure applied Laryngoscope Size: Mac and 4 Grade View: Grade II Tube type: Oral Tube size: 7.0 mm Number of attempts: 1 Airway Equipment and Method: Stylet and Oral airway Placement Confirmation: ETT inserted through vocal cords under direct vision,  positive ETCO2 and breath sounds checked- equal and bilateral Secured at: 20 cm Tube secured with: Tape Dental Injury: Teeth and Oropharynx as per pre-operative assessment  Comments: Immediately prior to intubation, green gastric content came to posterior oralpharynx. sucioned and intubated immediately after oralpharynx clear. No gastric secretions in ETT , no gastric secretions noted after ETT suctioned as well.

## 2017-03-09 NOTE — Op Note (Signed)
Preoperative diagnosis: Infected left ureter stone Postoperative diagnosis: Infected left ureter stone Surgery: Cystoscopy retrograde ureterogram and insertion of left ureteral stent Surgeon: Dr. Lorin Picket Bhavya Grand  The patient has the above diagnoses and consented the above procedure. 24 French cystoscope was utilized. Bladder mucosa and trigone were normal.The patient had a very flat trigone. She had very small ureteral orifices. It was very difficult to engage the left ureter with a sensor wire so I finally used the deflecting bridge and it went in easily. The sensor wire under fluoroscopy was passed to the mid ureter. A 6 French open-ended ureteral catheter was passed over it. A sensor wire was removed.  Retrograde ureterogram: I did a gentle retrograde ureterogram with about 2-3 mL's of contrast just enough to see the dilated renal pelvis. One could see edema around the kidney. There was no extravasation.  Sensor wire was passed up curling in the upper pole calyx. Open-ended ureteral catheter was removed.  Under fluoroscopic and cystoscopic guidance a 24  Centimeter by 6 French stent was passed over the wire curling in the left renal pelvis and curling in the bladder. An went in very nicely. There was very impressive white pus from the left ureter.  The patient will be followed as per sepsis protocol

## 2017-03-09 NOTE — Progress Notes (Signed)
PHARMACY - PHYSICIAN COMMUNICATION CRITICAL VALUE ALERT - BLOOD CULTURE IDENTIFICATION (BCID)  Results for orders placed or performed during the hospital encounter of 03/08/17  Blood Culture ID Panel (Reflexed) (Collected: 03/08/2017  8:59 PM)  Result Value Ref Range   Enterococcus species NOT DETECTED NOT DETECTED   Listeria monocytogenes NOT DETECTED NOT DETECTED   Staphylococcus species NOT DETECTED NOT DETECTED   Staphylococcus aureus NOT DETECTED NOT DETECTED   Streptococcus species NOT DETECTED NOT DETECTED   Streptococcus agalactiae NOT DETECTED NOT DETECTED   Streptococcus pneumoniae NOT DETECTED NOT DETECTED   Streptococcus pyogenes NOT DETECTED NOT DETECTED   Acinetobacter baumannii NOT DETECTED NOT DETECTED   Enterobacteriaceae species DETECTED (A) NOT DETECTED   Enterobacter cloacae complex NOT DETECTED NOT DETECTED   Escherichia coli DETECTED (A) NOT DETECTED   Klebsiella oxytoca NOT DETECTED NOT DETECTED   Klebsiella pneumoniae NOT DETECTED NOT DETECTED   Proteus species NOT DETECTED NOT DETECTED   Serratia marcescens NOT DETECTED NOT DETECTED   Carbapenem resistance NOT DETECTED NOT DETECTED   Haemophilus influenzae NOT DETECTED NOT DETECTED   Neisseria meningitidis NOT DETECTED NOT DETECTED   Pseudomonas aeruginosa NOT DETECTED NOT DETECTED   Candida albicans NOT DETECTED NOT DETECTED   Candida glabrata NOT DETECTED NOT DETECTED   Candida krusei NOT DETECTED NOT DETECTED   Candida parapsilosis NOT DETECTED NOT DETECTED   Candida tropicalis NOT DETECTED NOT DETECTED    Name of physician (or Provider) Contacted: Dr Sherryl Barters (on call for MacDiarmid) texted via AMION - no call back  Changes to prescribed antibiotics recommended: continue Ceftriaxone 2 gm IV q24, DC cipro.  Herby Abraham, Pharm.D. [phone] 03/09/2017 6:45 PM

## 2017-03-09 NOTE — H&P (Addendum)
History and Physical    KARISSA MEENAN ZOX:[id number] DOB: 1950-06-07 DOA: 03/08/2017  PCP: Elfredia Nevins, MD   Patient coming from: Home, by way of [facility]   Chief Complaint: Left-sided abdominal pain, dysuria   HPI: Jody Hernandez is a 66 y.o. female with medical history significant for hypertension, now presenting to the emergency department for evaluation of left-sided abdominal pain and dysuria. Patient had been in her usual state of health until last night when she noted pain in the left side of her abdomen and dysuria. She also reports some nausea and vomiting. Pain is described as dull, constant, and associated with anorexia. She also reports a general malaise. She has never experienced similar symptoms previously and denied any sick contacts.  [facility] ED Course: Upon arrival to the ED, patient is found to be febrile to 38.7 C, saturating adequately on room air, tachycardic to the 120s, and with stable blood pressure. EKG features a sinus tachycardia with rate 111. Chemistry panels notable for a sodium of 134 and creatinine of 1.08 with no priors available for comparison. CBC demonstrates a leukocytosis of 15,600 and a mild normocytic anemia with hemoglobin of 11.6. Lactic acid is slightly elevated to 2.1. Urinalysis features rare bacteria, but is otherwise unremarkable. CT of the abdomen and pelvis features marked left hydronephrosis and hydroureter secondary to a 7 mm distal left ureteral stone. Also noted on the CT is significant perinephric edema with rupture not excluded. Urology was consulted by the ED physician and transfer to [facility] for emergent procedure was advised, and initiation a sepsis protocol was also recommended. Blood and urine cultures were collected, 30 cc/kg normal saline was given, and the patient was treated with empiric Rocephin. Tachycardia improved, blood pressure remained stable, and the patient is taken to the OR for stenting upon arrival at [facility].  She will be admitted to the telemetry unit for ongoing evaluation and management of sepsis secondary to UTI, complicated by obstructing stone.  Review of Systems:  All other systems reviewed and apart from HPI, are negative.  Past Medical History:  Diagnosis Date  . Hypertension     Past Surgical History:  Procedure Laterality Date  . BREAST SURGERY     lumpectomy     reports that she has never smoked. She has never used smokeless tobacco. She reports that she does not drink alcohol or use drugs.  No Known Allergies  Family History  Problem Relation Age of Onset  . Kidney cancer Neg Hx      Prior to Admission medications   Medication Sig Start Date End Date Taking? Authorizing Provider  ibuprofen (ADVIL,MOTRIN) 200 MG tablet Take 200 mg by mouth every 6 (six) hours as needed for mild pain or moderate pain.   Yes [provider]  lisinopril (PRINIVIL,ZESTRIL) 20 MG tablet Take 20 mg by mouth daily.   Yes [provider]    Physical Exam: Vitals:   03/09/17 0145 03/09/17 0200 03/09/17 0215 03/09/17 0225  BP: 128/77 128/81 124/79 131/85  Pulse: 86 86 86 86  Resp: (!) 22 20 (!) 21 18  Temp:   98.3 F (36.8 C) 99 F (37.2 C)  TempSrc:      SpO2: 100% 97% 95% 95%  Weight:      Height:         Examined after procedure.  Constitutional: NAD, calm, comfortable Eyes: PERTLA, lids and conjunctivae normal ENMT: Mucous membranes are moist. Posterior pharynx clear of any exudate  or lesions.   Neck: normal, supple, no masses, no thyromegaly Respiratory: clear to auscultation bilaterally, no wheezing, no crackles. Normal respiratory effort.   Cardiovascular: S1 & S2 heard, regular rate and rhythm. No significant JVD. Abdomen: No distension, no tenderness, soft. Bowel sounds normal.  Musculoskeletal: no clubbing / cyanosis. No joint deformity upper and lower extremities.   Skin: no significant rashes, lesions, ulcers. Warm, dry, well-perfused. Neurologic: CN  2-12 grossly intact. Sensation intact. Strength 5/5 in all 4 limbs.  Psychiatric: Alert and oriented x 3. Pleasant, cooperative.     Labs on Admission: I have personally reviewed following labs and imaging studies  CBC:  Recent Labs Lab 03/08/17 1608  WBC 15.6*  HGB 11.6*  HCT 34.9*  MCV 89.9  PLT 167   Basic Metabolic Panel:  Recent Labs Lab 03/08/17 1608  NA 134*  K 3.5  CL 103  CO2 22  GLUCOSE 125*  BUN 22*  CREATININE 1.08*  CALCIUM 9.0   GFR: Estimated Creatinine Clearance: 51.6 mL/min (A) (by C-G formula based on SCr of 1.08 mg/dL (H)). Liver Function Tests:  Recent Labs Lab 03/08/17 1608  AST 24  ALT 15  ALKPHOS 84  BILITOT 1.3*  PROT 6.6  ALBUMIN 3.4*    Recent Labs Lab 03/08/17 1608  LIPASE 27   No results for input(s): AMMONIA in the last 168 hours. Coagulation Profile: No results for input(s): INR, PROTIME in the last 168 hours. Cardiac Enzymes: No results for input(s): CKTOTAL, CKMB, CKMBINDEX, TROPONINI in the last 168 hours. BNP (last 3 results) No results for input(s): PROBNP in the last 8760 hours. HbA1C: No results for input(s): HGBA1C in the last 72 hours. CBG: No results for input(s): GLUCAP in the last 168 hours. Lipid Profile: No results for input(s): CHOL, HDL, LDLCALC, TRIG, CHOLHDL, LDLDIRECT in the last 72 hours. Thyroid Function Tests: No results for input(s): TSH, T4TOTAL, FREET4, T3FREE, THYROIDAB in the last 72 hours. Anemia Panel: No results for input(s): VITAMINB12, FOLATE, FERRITIN, TIBC, IRON, RETICCTPCT in the last 72 hours. Urine analysis:    Component Value Date/Time   COLORURINE YELLOW 03/08/2017 1540   APPEARANCEUR CLEAR 03/08/2017 1540   LABSPEC 1.020 03/08/2017 1540   PHURINE 7.0 03/08/2017 1540   GLUCOSEU NEGATIVE 03/08/2017 1540   HGBUR NEGATIVE 03/08/2017 1540   BILIRUBINUR NEGATIVE 03/08/2017 1540   KETONESUR 5 (A) 03/08/2017 1540   PROTEINUR 30 (A) 03/08/2017 1540   NITRITE NEGATIVE  03/08/2017 1540   LEUKOCYTESUR NEGATIVE 03/08/2017 1540   Sepsis Labs: (procalcitonin:4,lacticidven:4) ) Recent Results (from the past 240 hour(s))  Culture, blood (routine x 2)     Status: None (Preliminary result)   Collection Time: 03/08/17  8:59 PM  Result Value Ref Range Status   Specimen Description BLOOD LEFT HAND  Final   Special Requests   Final    BOTTLES DRAWN AEROBIC AND ANAEROBIC Blood Culture adequate volume   Culture PENDING  Incomplete   Report Status PENDING  Incomplete  Culture, blood (routine x 2)     Status: None (Preliminary result)   Collection Time: 03/08/17  9:06 PM  Result Value Ref Range Status   Specimen Description BLOOD LEFT HAND  Final   Special Requests   Final    BOTTLES DRAWN AEROBIC AND ANAEROBIC Blood Culture adequate volume   Culture PENDING  Incomplete   Report Status PENDING  Incomplete     Radiological Exams on Admission: Ct Abdomen Pelvis W Contrast  Result Date: 03/08/2017 CLINICAL DATA:  Upper  to lower LEFT side abdominal pain, some burning with urination EXAM: CT ABDOMEN AND PELVIS WITH CONTRAST TECHNIQUE: Multidetector CT imaging of the abdomen and pelvis was performed using the standard protocol following bolus administration of intravenous contrast. Sagittal and coronal MPR images reconstructed from axial data set. CONTRAST:  ISOVUE-300 IOPAMIDOL (ISOVUE-300) INJECTION 61% IV. No oral contrast. COMPARISON:  None FINDINGS: Lower chest: Bibasilar atelectasis Hepatobiliary: Distended gallbladder. Liver unremarkable. No biliary dilatation. Pancreas: Normal appearance Spleen: Normal appearance though mild perisplenic fluid is seen Adrenals/Urinary Tract: Adrenal glands thickened without discrete mass. Significant LEFT hydronephrosis and hydroureter terminating at a 7 mm distal LEFT ureteral calculus image 79. Significant perinephric and peripelvic edema with edema extending throughout the LEFT anterior pararenal space laterally to  the proximal descending colon and perisplenic. Cannot exclude 4 axial rupture though no definite contrast extravasation is seen on delayed images. Significant delay in LEFT nephrogram consistent with impaired renal function. Tiny nonobstructing inferior pole RIGHT renal calculus. RIGHT kidney and ureter otherwise normal appearance. Bladder unremarkable. Stomach/Bowel: Redundant cecum. Normal appendix. Slight prominent stool in rectum and at the distal transverse colon/splenic flexure. Moderate-sized hiatal hernia. Stomach and bowel loops otherwise unremarkable. Vascular/Lymphatic: Aorta normal caliber. Few pelvic phleboliths. No adenopathy. Reproductive: Unremarkable uterus and LEFT ovary. RIGHT ovarian cyst 3.4 x 3.1 cm image 75. Other: No additional ascites Musculoskeletal: Demineralized IMPRESSION: Marked LEFT hydronephrosis and hydroureter secondary to a 2 mm distal LEFT ureteral calculus. Significant perinephric edema extending into anterior pararenal space laterally to the proximal descending colon and perisplenic region, cannot exclude appendiceal rupture. Evidence of impaired LEFT renal function. Tiny nonobstructing RIGHT renal calculus. Moderate-sized hiatal hernia. Electronically Signed   By: Ulyses Southward M.D.   On: 03/08/2017 19:53   Dg C-arm 1-60 Min-no Report  Result Date: 03/09/2017 Fluoroscopy was utilized by the requesting physician.  No radiographic interpretation.    EKG: Independently reviewed. Sinus tachycardia (rate 111).   Assessment/Plan  1. Sepsis secondary to UTI, obstructing ureteral stone  - Pt presents with left-sided abdominal pain and dysuria  - She is found to meet sepsis criteria and CT abd/pelvis demonstrates 7mm stone in distal left ureter with marked hydronephrosis and hydroureter; extensive perinephric edema with rupture not excluded on CT  - Mild renal insufficiency noted with unknown baseline  - Urology is consulting and much appreciated, taking patient to OR now  for stenting  - She has been treated with 2 liters of NS and empiric Rocephin in the ED; urine and blood cultures were collected  - Plan to continue empiric abx pending culture data, continue IVF, trend lactate, prn analgesia, repeat chem panel in am    2. Hypertension  - BP at goal  - Managed with lisinopril at home; will hold initially d/t mild renal insufficiency with unknown baseline  - Use hydralazine IVP's prn for now    DVT prophylaxis: SCD's Code Status: Full  Family Communication: Discussed with patient Disposition Plan: Admit to telemetry Consults called: Urology Admission status: Inpatient    Briscoe Deutscher, MD [facility] Pager [phone]  If 7PM-7AM, please contact night-coverage [email or web address] Password [email or web address]  03/09/2017, 2:51 AM

## 2017-03-09 NOTE — Anesthesia Postprocedure Evaluation (Signed)
Anesthesia Post Note  Patient: Jody Hernandez  Procedure(s) Performed: CYSTOSCOPY, RETROGRADE PYELOGRAM WITH STENT PLACEMENT (Left )     Patient location during evaluation: PACU Anesthesia Type: General Level of consciousness: awake and alert Pain management: pain level controlled Vital Signs Assessment: post-procedure vital signs reviewed and stable Respiratory status: spontaneous breathing, nonlabored ventilation, respiratory function stable and patient connected to nasal cannula oxygen Cardiovascular status: blood pressure returned to baseline and stable Postop Assessment: no apparent nausea or vomiting Anesthetic complications: no    Last Vitals:  Vitals:   03/09/17 0121 03/09/17 0130  BP: 125/81 129/86  Pulse: 87 86  Resp: 15 20  Temp: 37.1 C   SpO2: 100% 100%    Last Pain:  Vitals:   03/09/17 0121  TempSrc:   PainSc: Asleep                 Phillips Grout

## 2017-03-10 ENCOUNTER — Encounter (HOSPITAL_COMMUNITY): Payer: Self-pay | Admitting: Urology

## 2017-03-10 DIAGNOSIS — N132 Hydronephrosis with renal and ureteral calculous obstruction: Secondary | ICD-10-CM

## 2017-03-10 DIAGNOSIS — I10 Essential (primary) hypertension: Secondary | ICD-10-CM

## 2017-03-10 DIAGNOSIS — N390 Urinary tract infection, site not specified: Secondary | ICD-10-CM

## 2017-03-10 DIAGNOSIS — A419 Sepsis, unspecified organism: Secondary | ICD-10-CM

## 2017-03-10 LAB — CBC WITH DIFFERENTIAL/PLATELET
BASOS ABS: 0 10*3/uL (ref 0.0–0.1)
BASOS PCT: 0 %
EOS ABS: 0 10*3/uL (ref 0.0–0.7)
Eosinophils Relative: 0 %
HCT: 33.3 % — ABNORMAL LOW (ref 36.0–46.0)
HEMOGLOBIN: 10.9 g/dL — AB (ref 12.0–15.0)
LYMPHS PCT: 3 %
Lymphs Abs: 0.5 10*3/uL — ABNORMAL LOW (ref 0.7–4.0)
MCH: 29.5 pg (ref 26.0–34.0)
MCHC: 32.7 g/dL (ref 30.0–36.0)
MCV: 90 fL (ref 78.0–100.0)
MONOS PCT: 2 %
Monocytes Absolute: 0.3 10*3/uL (ref 0.1–1.0)
NEUTROS ABS: 16.7 10*3/uL — AB (ref 1.7–7.7)
Neutrophils Relative %: 95 %
Platelets: 149 10*3/uL — ABNORMAL LOW (ref 150–400)
RBC: 3.7 MIL/uL — AB (ref 3.87–5.11)
RDW: 14.7 % (ref 11.5–15.5)
WBC: 17.5 10*3/uL — ABNORMAL HIGH (ref 4.0–10.5)

## 2017-03-10 LAB — BASIC METABOLIC PANEL
ANION GAP: 6 (ref 5–15)
BUN: 20 mg/dL (ref 6–20)
CHLORIDE: 107 mmol/L (ref 101–111)
CO2: 24 mmol/L (ref 22–32)
Calcium: 8.6 mg/dL — ABNORMAL LOW (ref 8.9–10.3)
Creatinine, Ser: 0.97 mg/dL (ref 0.44–1.00)
GFR calc non Af Amer: 59 mL/min — ABNORMAL LOW (ref >60)
Glucose, Bld: 159 mg/dL — ABNORMAL HIGH (ref 65–99)
POTASSIUM: 3.5 mmol/L (ref 3.5–5.1)
SODIUM: 137 mmol/L (ref 135–145)

## 2017-03-10 LAB — GLUCOSE, CAPILLARY: Glucose-Capillary: 109 mg/dL — ABNORMAL HIGH (ref 65–99)

## 2017-03-10 NOTE — Progress Notes (Signed)
Vitals OK BLood c/s positve and pending Continue to treat sepsis SEE ME AS OUTPATIENT THANKS AND SEND HOME WITH ANTIBIOTICS - phone number given [facility]

## 2017-03-10 NOTE — Progress Notes (Signed)
PROGRESS NOTE    Jody Hernandez  EAV:[id number] DOB: Dec 20, 1950 DOA: 03/08/2017 PCP: Elfredia Nevins, MD    Brief Narrative:  Jody Hernandez is a 66 y.o. female with medical history significant for hypertension, now presenting to the emergency department for evaluation of left-sided abdominal pain and dysuria Assessment & Plan:   Principal Problem:   Sepsis secondary to UTI [facility]) Active Problems:   Hypertension   Urinary tract obstruction by kidney stone   Sepsis (HCC)   Ureteral stone with hydronephrosis   Sepsis from gram neg bacteremia from uti and obstructing ureteral stone.  Improving.  Underwent Cystoscopy retrograde ureterogram and insertion of left ureteral stent. On IV antibiotics.  Awaiting sensitivies back.  Repeat cultures ordered and pending.  Will need at least 2 weeks of Antibiotics.    Hypertension Controlled.    DVT prophylaxis:scd's Code Status:  Full code.  Family Communication: none at bedside Disposition Plan: home when bc are negative.   Consultants:   Urology.  Procedures: ureteral stent placement.  Antimicrobials:rocephin.   Subjective: Feeling better.   Objective: Vitals:   03/09/17 2048 03/10/17 0458 03/10/17 1011 03/10/17 1300  BP: 125/82 (!) 145/89 129/73 (!) 145/80  Pulse: 97 89 95 (!) 102  Resp: Temp: 99.3 F (37.4 C) 98.2 F (36.8 C) 98.5 F (36.9 C) 99.4 F (37.4 C)  TempSrc: Oral Oral Oral Oral  SpO2: 94% 97% 95% 95%  Weight:      Height:        Intake/Output Summary (Last 24 hours) at 03/10/17 1905 Last data filed at 03/10/17 1000  Gross per 24 hour  Intake              253 ml  Output             1100 ml  Net             -847 ml   Filed Weights   03/08/17 1537 03/09/17 0443  Weight: 81.6 kg (180 lb) 85.1 kg (187 lb 9.8 oz)    Examination:  General exam: Appears calm and comfortable  Respiratory system: Clear to auscultation. Respiratory effort normal. Cardiovascular system: S1 & S2 heard, RRR.  No JVD, murmurs, rubs, gallops or clicks. No pedal edema. Gastrointestinal system: Abdomen is nondistended, soft and nontender. No organomegaly or masses felt. Normal bowel sounds heard. Central nervous system: Alert and oriented. No focal neurological deficits. Extremities: Symmetric 5 x 5 power. Skin: No rashes, lesions or ulcers Psychiatry: Judgement and insight appear normal. Mood & affect appropriate.     Data Reviewed: I have personally reviewed following labs and imaging studies  CBC:  Recent Labs Lab 03/08/17 1608 03/09/17 0155 03/10/17 1059  WBC 15.6* 16.7* 17.5*  NEUTROABS  --  16.2* 16.7*  HGB 11.6* 10.7* 10.9*  HCT 34.9* 32.5* 33.3*  MCV 89.9 90.8 90.0  PLT 167 151 149*   Basic Metabolic Panel:  Recent Labs Lab 03/08/17 1608 03/09/17 0155 03/10/17 1059  NA 134* 134* 137  K 3.5 3.9 3.5  CL 103 105 107  CO2 22 21* 24  GLUCOSE 125* 110* 159*  BUN 22* 19 20  CREATININE 1.08* 1.10* 0.97  CALCIUM 9.0 8.1* 8.6*   GFR: Estimated Creatinine Clearance: 58.6 mL/min (by C-G formula based on SCr of 0.97 mg/dL). Liver Function Tests:  Recent Labs Lab 03/08/17 1608 03/09/17 0155  AST 24 26  ALT 15 16  ALKPHOS 84 59  BILITOT 1.3* 0.7  PROT  6.6 6.0*  ALBUMIN 3.4* 2.9*    Recent Labs Lab 03/08/17 1608  LIPASE 27   No results for input(s): AMMONIA in the last 168 hours. Coagulation Profile: No results for input(s): INR, PROTIME in the last 168 hours. Cardiac Enzymes: No results for input(s): CKTOTAL, CKMB, CKMBINDEX, TROPONINI in the last 168 hours. BNP (last 3 results) No results for input(s): PROBNP in the last 8760 hours. HbA1C: No results for input(s): HGBA1C in the last 72 hours. CBG:  Recent Labs Lab 03/09/17 0823 03/10/17 0835  GLUCAP 120* 109*   Lipid Profile: No results for input(s): CHOL, HDL, LDLCALC, TRIG, CHOLHDL, LDLDIRECT in the last 72 hours. Thyroid Function Tests: No results for input(s): TSH, T4TOTAL, FREET4, T3FREE,  THYROIDAB in the last 72 hours. Anemia Panel: No results for input(s): VITAMINB12, FOLATE, FERRITIN, TIBC, IRON, RETICCTPCT in the last 72 hours. Sepsis Labs:  Recent Labs Lab 03/08/17 2059 03/09/17 0003 03/09/17 0434 03/09/17 0835  LATICACIDVEN 2.1* 2.3* 1.2 1.2    Recent Results (from the past 240 hour(s))  Urine culture     Status: Abnormal (Preliminary result)   Collection Time: 03/08/17  3:40 PM  Result Value Ref Range Status   Specimen Description URINE, RANDOM  Final   Special Requests NONE  Final   Culture 20,000 COLONIES/mL ESCHERICHIA COLI (A)  Final   Report Status PENDING  Incomplete  Culture, blood (routine x 2)     Status: Abnormal (Preliminary result)   Collection Time: 03/08/17  8:59 PM  Result Value Ref Range Status   Specimen Description BLOOD LEFT HAND  Final   Special Requests   Final    BOTTLES DRAWN AEROBIC AND ANAEROBIC Blood Culture adequate volume   Culture  Setup Time   Final    GRAM NEGATIVE RODS AEROBIC BOTTLE ONLY Gram Stain Report Called to,Read Back By and Verified With: CORCORAN R. AT 1350 ON [id number] BY THOMPSON S. CRITICAL RESULT CALLED TO, READ BACK BY AND VERIFIED WITH: A PHAM PHARMD 1811 03/09/17 A BROWNING    Culture (A)  Final    ESCHERICHIA COLI SUSCEPTIBILITIES TO FOLLOW Performed at [facility], [address] [place], [place] [address]    Report Status PENDING  Incomplete  Blood Culture ID Panel (Reflexed)     Status: Abnormal   Collection Time: 03/08/17  8:59 PM  Result Value Ref Range Status   Enterococcus species NOT DETECTED NOT DETECTED Final   Listeria monocytogenes NOT DETECTED NOT DETECTED Final   Staphylococcus species NOT DETECTED NOT DETECTED Final   Staphylococcus aureus NOT DETECTED NOT DETECTED Final   Streptococcus species NOT DETECTED NOT DETECTED Final   Streptococcus agalactiae NOT DETECTED NOT DETECTED Final   Streptococcus pneumoniae NOT DETECTED NOT DETECTED Final   Streptococcus pyogenes NOT  DETECTED NOT DETECTED Final   Acinetobacter baumannii NOT DETECTED NOT DETECTED Final   Enterobacteriaceae species DETECTED (A) NOT DETECTED Final    Comment: Enterobacteriaceae represent a large family of gram-negative bacteria, not a single organism. CRITICAL RESULT CALLED TO, READ BACK BY AND VERIFIED WITH: A PHAM PHARMD 1811 03/09/17 A BROWNING    Enterobacter cloacae complex NOT DETECTED NOT DETECTED Final   Escherichia coli DETECTED (A) NOT DETECTED Final    Comment: CRITICAL RESULT CALLED TO, READ BACK BY AND VERIFIED WITH: A PHAM PHARMD 1811 03/09/17 A BROWNING    Klebsiella oxytoca NOT DETECTED NOT DETECTED Final   Klebsiella pneumoniae NOT DETECTED NOT DETECTED Final   Proteus species NOT DETECTED NOT DETECTED  Final   Serratia marcescens NOT DETECTED NOT DETECTED Final   Carbapenem resistance NOT DETECTED NOT DETECTED Final   Haemophilus influenzae NOT DETECTED NOT DETECTED Final   Neisseria meningitidis NOT DETECTED NOT DETECTED Final   Pseudomonas aeruginosa NOT DETECTED NOT DETECTED Final   Candida albicans NOT DETECTED NOT DETECTED Final   Candida glabrata NOT DETECTED NOT DETECTED Final   Candida krusei NOT DETECTED NOT DETECTED Final   Candida parapsilosis NOT DETECTED NOT DETECTED Final   Candida tropicalis NOT DETECTED NOT DETECTED Final    Comment: Performed at [facility], [address] [place], [place] [address]  Culture, blood (routine x 2)     Status: None (Preliminary result)   Collection Time: 03/08/17  9:06 PM  Result Value Ref Range Status   Specimen Description BLOOD LEFT HAND  Final   Special Requests   Final    BOTTLES DRAWN AEROBIC AND ANAEROBIC Blood Culture adequate volume   Culture  Setup Time   Final    GRAM NEGATIVE RODS AEROBIC BOTTLE ONLY Gram Stain Report Called to,Read Back By and Verified With: CORCORAN R. AT 1350 ON [id number] BY THOMPSON S. IDENTIFICATION TO FOLLOW CRITICAL RESULT CALLED TO, READ BACK BY AND VERIFIED WITH: A [facility]  PHARMD 1811 03/09/17 A BROWNING Performed at [facility], [address] [place], [place] [address]    Culture GRAM NEGATIVE RODS  Final   Report Status PENDING  Incomplete         Radiology Studies: Ct Abdomen Pelvis W Contrast  Result Date: 03/08/2017 CLINICAL DATA:  Upper to lower LEFT side abdominal pain, some burning with urination EXAM: CT ABDOMEN AND PELVIS WITH CONTRAST TECHNIQUE: Multidetector CT imaging of the abdomen and pelvis was performed using the standard protocol following bolus administration of intravenous contrast. Sagittal and coronal MPR images reconstructed from axial data set. CONTRAST:  ISOVUE-300 IOPAMIDOL (ISOVUE-300) INJECTION 61% IV. No oral contrast. COMPARISON:  None FINDINGS: Lower chest: Bibasilar atelectasis Hepatobiliary: Distended gallbladder. Liver unremarkable. No biliary dilatation. Pancreas: Normal appearance Spleen: Normal appearance though mild perisplenic fluid is seen Adrenals/Urinary Tract: Adrenal glands thickened without discrete mass. Significant LEFT hydronephrosis and hydroureter terminating at a 7 mm distal LEFT ureteral calculus image 79. Significant perinephric and peripelvic edema with edema extending throughout the LEFT anterior pararenal space laterally to the proximal descending colon and perisplenic. Cannot exclude 4 axial rupture though no definite contrast extravasation is seen on delayed images. Significant delay in LEFT nephrogram consistent with impaired renal function. Tiny nonobstructing inferior pole RIGHT renal calculus. RIGHT kidney and ureter otherwise normal appearance. Bladder unremarkable. Stomach/Bowel: Redundant cecum. Normal appendix. Slight prominent stool in rectum and at the distal transverse colon/splenic flexure. Moderate-sized hiatal hernia. Stomach and bowel loops otherwise unremarkable. Vascular/Lymphatic: Aorta normal caliber. Few pelvic phleboliths. No adenopathy. Reproductive: Unremarkable uterus and  LEFT ovary. RIGHT ovarian cyst 3.4 x 3.1 cm image 75. Other: No additional ascites Musculoskeletal: Demineralized IMPRESSION: Marked LEFT hydronephrosis and hydroureter secondary to a 2 mm distal LEFT ureteral calculus. Significant perinephric edema extending into anterior pararenal space laterally to the proximal descending colon and perisplenic region, cannot exclude appendiceal rupture. Evidence of impaired LEFT renal function. Tiny nonobstructing RIGHT renal calculus. Moderate-sized hiatal hernia. Electronically Signed   By: Ulyses Southward M.D.   On: 03/08/2017 19:53   Dg C-arm 1-60 Min-no Report  Result Date: 03/09/2017 Fluoroscopy was utilized by the requesting physician.  No radiographic interpretation.        Scheduled  Meds: . sodium chloride flush  3 mL Intravenous Q12H   Continuous Infusions: . cefTRIAXone (ROCEPHIN)  IV Stopped (03/09/17 2050)     LOS: 1 day    Time spent: 35 min.     Kathlen Mody, MD [facility] Pager [phone]  If 7PM-7AM, please contact night-coverage [email or web address] Password [email or web address] 03/10/2017, 7:05 PM

## 2017-03-11 LAB — CULTURE, BLOOD (ROUTINE X 2)
SPECIAL REQUESTS: ADEQUATE
Special Requests: ADEQUATE

## 2017-03-11 LAB — CBC
HCT: 33.1 % — ABNORMAL LOW (ref 36.0–46.0)
Hemoglobin: 11 g/dL — ABNORMAL LOW (ref 12.0–15.0)
MCH: 29.8 pg (ref 26.0–34.0)
MCHC: 33.2 g/dL (ref 30.0–36.0)
MCV: 89.7 fL (ref 78.0–100.0)
PLATELETS: 154 10*3/uL (ref 150–400)
RBC: 3.69 MIL/uL — ABNORMAL LOW (ref 3.87–5.11)
RDW: 14.8 % (ref 11.5–15.5)
WBC: 13.5 10*3/uL — ABNORMAL HIGH (ref 4.0–10.5)

## 2017-03-11 LAB — GLUCOSE, CAPILLARY: Glucose-Capillary: 110 mg/dL — ABNORMAL HIGH (ref 65–99)

## 2017-03-11 LAB — URINE CULTURE: Culture: 20000 — AB

## 2017-03-11 MED ORDER — CIPROFLOXACIN HCL 500 MG PO TABS: 500.0000 mg | ORAL_TABLET | Freq: Two times a day (BID) | ORAL | 0 refills | Status: AC

## 2017-03-11 NOTE — Progress Notes (Signed)
Patient ID: Azalia Bilis, female   DOB: December 04, 1950, 66 y.o.   MRN: [id number]  2 Days Post-Op Subjective: Pt feeling better.  Pain is minimal.   Objective: Vital signs in last 24 hours: Temp:  [98.2 F (36.8 C)-100.2 F (37.9 C)] 98.2 F (36.8 C) (10/13 0753) Pulse Rate:  [88-102] 88 (10/13 0531) Resp:  [18-22] 22 (10/13 0531) BP: (129-165)/(73-94) 165/94 (10/13 0531) SpO2:  [95 %-98 %] 97 % (10/13 0531)  Intake/Output from previous day: 10/12 0701 - 10/13 0700 In: 3 [I.V.:3] Out: 1100 [Urine:1100] Intake/Output this shift: No intake/output data recorded.  Physical Exam:  General: Alert and oriented GU: No CVAT  Lab Results:  Recent Labs  03/08/17 1608 03/09/17 0155 03/10/17 1059  HGB 11.6* 10.7* 10.9*  HCT 34.9* 32.5* 33.3*   CBC Latest Ref Rng & Units 03/10/2017 03/09/2017 03/08/2017  WBC 4.0 - 10.5 K/uL 17.5(H) 16.7(H) 15.6(H)  Hemoglobin 12.0 - 15.0 g/dL 10.9(L) 10.7(L) 11.6(L)  Hematocrit 36.0 - 46.0 % 33.3(L) 32.5(L) 34.9(L)  Platelets 150 - 400 K/uL 149(L) 151 167     BMET  Recent Labs  03/09/17 0155 03/10/17 1059  NA 134* 137  K 3.9 3.5  CL 105 107  CO2 21* 24  GLUCOSE 110* 159*  BUN 19 20  CREATININE 1.10* 0.97  CALCIUM 8.1* 8.6*     Studies/Results: No results found.  Assessment/Plan: Left ureteral stone with pyelonephritis - S/P stent - WBC increasing, still with some fever but urine culture sensitive to ceftriaxone and ciprofloxacin - Once appropriate, ok to transition to oral ciprofloxacin. Will need total of 14 days of antibiotic therapy total and outpatient f/u with Dr. Sherron Monday.   LOS: 2 days   Shade Kaley,LES 03/11/2017, 8:28 AM

## 2017-03-11 NOTE — Progress Notes (Signed)
All d/c instructions given w/ verbal understanding.Awaiting for ride.

## 2017-03-11 NOTE — Progress Notes (Signed)
D/c to home w/ family via w/c voices no c/o.

## 2017-03-13 NOTE — Discharge Summary (Signed)
Physician Discharge Summary  Jody Hernandez:[id number] DOB: Jun 23, 1950 DOA: 03/08/2017  PCP: Elfredia Nevins, MD  Admit date: 03/08/2017 Discharge date: 03/10/2017  Admitted From: Home.  Disposition:  Home.   Recommendations for Outpatient Follow-up:  1. Follow up with PCP in 1-2 weeks 2. Please obtain BMP/CBC in one week 3. Please follow up with urology as recommended.     Discharge Condition:stable.  CODE STATUS: full code.  Diet recommendation: Heart Healthy  Brief/Interim Summary: Jody Hernandez a 66 y.o.femalewith medical history significant for hypertension, now presenting to the emergency department for evaluation of left-sided abdominal pain and dysuria  Discharge Diagnoses:  Principal Problem:   Sepsis secondary to UTI [facility]) Active Problems:   Hypertension   Urinary tract obstruction by kidney stone   Sepsis (HCC)   Ureteral stone with hydronephrosis  Sepsis from gram neg bacteremia from uti and obstructing ureteral stone.  Improving.  Underwent Cystoscopy retrograde ureterogram and insertion of left ureteral stent. Started on IV antibiotics. Blood cultures grew e coli and sensitive to ciprofloxacin.  Repeat blood cultures negative.  Discussed with urology and ID, plan for 2 weeks of ciprofloxacin to complete the course.   Hypertension Controlled.    Discharge Instructions  Discharge Instructions    Diet - low sodium heart healthy    Complete by:  As directed    Discharge instructions    Complete by:  As directed    FOLLOW UP WITH UROLOGY AS RECOMMENDED.     Allergies as of 03/11/2017   No Known Allergies     Medication List    STOP taking these medications   ibuprofen 200 MG tablet Commonly known as:  ADVIL,MOTRIN     TAKE these medications   ciprofloxacin 500 MG tablet Commonly known as:  CIPRO Take 1 tablet (500 mg total) by mouth 2 (two) times daily.   lisinopril 20 MG tablet Commonly known as:  PRINIVIL,ZESTRIL Take 20 mg  by mouth daily.      Follow-up Information    MacDiarmid, Lorin Picket, MD Follow up.   Specialty:  Urology Why:  Please call for appointment for 10-14 days after discharge. Contact information: [address] [place] [address] [phone]        Elfredia Nevins, MD. Schedule an appointment as soon as possible for a visit in 1 week(s).   Specialty:  Internal Medicine Contact information: [address] [place] [address] [phone]          No Known Allergies  Consultations:  Urology.    Procedures/Studies: Ct Abdomen Pelvis W Contrast  Result Date: 03/08/2017 CLINICAL DATA:  Upper to lower LEFT side abdominal pain, some burning with urination EXAM: CT ABDOMEN AND PELVIS WITH CONTRAST TECHNIQUE: Multidetector CT imaging of the abdomen and pelvis was performed using the standard protocol following bolus administration of intravenous contrast. Sagittal and coronal MPR images reconstructed from axial data set. CONTRAST:  ISOVUE-300 IOPAMIDOL (ISOVUE-300) INJECTION 61% IV. No oral contrast. COMPARISON:  None FINDINGS: Lower chest: Bibasilar atelectasis Hepatobiliary: Distended gallbladder. Liver unremarkable. No biliary dilatation. Pancreas: Normal appearance Spleen: Normal appearance though mild perisplenic fluid is seen Adrenals/Urinary Tract: Adrenal glands thickened without discrete mass. Significant LEFT hydronephrosis and hydroureter terminating at a 7 mm distal LEFT ureteral calculus image 79. Significant perinephric and peripelvic edema with edema extending throughout the LEFT anterior pararenal space laterally to the proximal descending colon and perisplenic. Cannot exclude 4 axial rupture though no definite contrast extravasation is seen on delayed images. Significant delay  in LEFT nephrogram consistent with impaired renal function. Tiny nonobstructing inferior pole RIGHT renal calculus. RIGHT kidney and ureter otherwise normal appearance. Bladder  unremarkable. Stomach/Bowel: Redundant cecum. Normal appendix. Slight prominent stool in rectum and at the distal transverse colon/splenic flexure. Moderate-sized hiatal hernia. Stomach and bowel loops otherwise unremarkable. Vascular/Lymphatic: Aorta normal caliber. Few pelvic phleboliths. No adenopathy. Reproductive: Unremarkable uterus and LEFT ovary. RIGHT ovarian cyst 3.4 x 3.1 cm image 75. Other: No additional ascites Musculoskeletal: Demineralized IMPRESSION: Marked LEFT hydronephrosis and hydroureter secondary to a 2 mm distal LEFT ureteral calculus. Significant perinephric edema extending into anterior pararenal space laterally to the proximal descending colon and perisplenic region, cannot exclude appendiceal rupture. Evidence of impaired LEFT renal function. Tiny nonobstructing RIGHT renal calculus. Moderate-sized hiatal hernia. Electronically Signed   By: Ulyses Southward M.D.   On: 03/08/2017 19:53   Dg C-arm 1-60 Min-no Report  Result Date: 03/09/2017 Fluoroscopy was utilized by the requesting physician.  No radiographic interpretation.       Subjective: No new complaints.   Discharge Exam: Vitals:   03/11/17 0753 03/11/17 1503  BP:  (!) 160/80  Pulse:  97  Resp:  20  Temp: 98.2 F (36.8 C) 98.9 F (37.2 C)  SpO2:  97%   Vitals:   03/10/17 2149 03/11/17 0531 03/11/17 0753 03/11/17 1503  BP: (!) 165/87 (!) 165/94  (!) 160/80  Pulse: 98 88  97  Resp: 19 (!) 22  20  Temp: 99.5 F (37.5 C) 100.2 F (37.9 C) 98.2 F (36.8 C) 98.9 F (37.2 C)  TempSrc: Oral Oral Oral Oral  SpO2: 98% 97%  97%  Weight:      Height:        General: Pt is alert, awake, not in acute distress Cardiovascular: RRR, S1/S2 +, no rubs, no gallops Respiratory: CTA bilaterally, no wheezing, no rhonchi Abdominal: Soft, NT, ND, bowel sounds + Extremities: no edema, no cyanosis    The results of significant diagnostics from this hospitalization (including imaging, microbiology, ancillary and  laboratory) are listed below for reference.     Microbiology: Recent Results (from the past 240 hour(s))  Urine culture     Status: Abnormal   Collection Time: 03/08/17  3:40 PM  Result Value Ref Range Status   Specimen Description URINE, RANDOM  Final   Special Requests NONE  Final   Culture 20,000 COLONIES/mL ESCHERICHIA COLI (A)  Final   Report Status 03/11/2017 FINAL  Final   Organism ID, Bacteria ESCHERICHIA COLI (A)  Final      Susceptibility   Escherichia coli - MIC*    AMPICILLIN >=32 RESISTANT Resistant     CEFAZOLIN 16 SENSITIVE Sensitive     CEFTRIAXONE <=1 SENSITIVE Sensitive     CIPROFLOXACIN <=0.25 SENSITIVE Sensitive     GENTAMICIN <=1 SENSITIVE Sensitive     IMIPENEM <=0.25 SENSITIVE Sensitive     NITROFURANTOIN <=16 SENSITIVE Sensitive     TRIMETH/SULFA <=20 SENSITIVE Sensitive     AMPICILLIN/SULBACTAM 16 INTERMEDIATE Intermediate     PIP/TAZO <=4 SENSITIVE Sensitive     Extended ESBL NEGATIVE Sensitive     * 20,000 COLONIES/mL ESCHERICHIA COLI  Culture, blood (routine x 2)     Status: Abnormal   Collection Time: 03/08/17  8:59 PM  Result Value Ref Range Status   Specimen Description BLOOD LEFT HAND  Final   Special Requests   Final    BOTTLES DRAWN AEROBIC AND ANAEROBIC Blood Culture adequate volume   Culture  Setup Time  Final    GRAM NEGATIVE RODS AEROBIC BOTTLE ONLY Gram Stain Report Called to,Read Back By and Verified With: CORCORAN R. AT 1350 ON 101118 BY THOMPSON S. CRITICAL RESULT CALLED TO, READ BACK BY AND VERIFIED WITH: A [facility] PHARMD 1811 03/09/17 A BROWNING Performed at [facility], [address] [place], [place] [address]    Culture ESCHERICHIA COLI (A)  Final   Report Status 03/11/2017 FINAL  Final   Organism ID, Bacteria ESCHERICHIA COLI  Final      Susceptibility   Escherichia coli - MIC*    AMPICILLIN >=32 RESISTANT Resistant     CEFAZOLIN 16 SENSITIVE Sensitive     CEFEPIME <=1 SENSITIVE Sensitive     CEFTAZIDIME <=1  SENSITIVE Sensitive     CEFTRIAXONE <=1 SENSITIVE Sensitive     CIPROFLOXACIN <=0.25 SENSITIVE Sensitive     GENTAMICIN <=1 SENSITIVE Sensitive     IMIPENEM <=0.25 SENSITIVE Sensitive     TRIMETH/SULFA <=20 SENSITIVE Sensitive     AMPICILLIN/SULBACTAM 16 INTERMEDIATE Intermediate     PIP/TAZO <=4 SENSITIVE Sensitive     Extended ESBL NEGATIVE Sensitive     * ESCHERICHIA COLI  Blood Culture ID Panel (Reflexed)     Status: Abnormal   Collection Time: 03/08/17  8:59 PM  Result Value Ref Range Status   Enterococcus species NOT DETECTED NOT DETECTED Final   Listeria monocytogenes NOT DETECTED NOT DETECTED Final   Staphylococcus species NOT DETECTED NOT DETECTED Final   Staphylococcus aureus NOT DETECTED NOT DETECTED Final   Streptococcus species NOT DETECTED NOT DETECTED Final   Streptococcus agalactiae NOT DETECTED NOT DETECTED Final   Streptococcus pneumoniae NOT DETECTED NOT DETECTED Final   Streptococcus pyogenes NOT DETECTED NOT DETECTED Final   Acinetobacter baumannii NOT DETECTED NOT DETECTED Final   Enterobacteriaceae species DETECTED (A) NOT DETECTED Final    Comment: Enterobacteriaceae represent a large family of gram-negative bacteria, not a single organism. CRITICAL RESULT CALLED TO, READ BACK BY AND VERIFIED WITH: A [facility] PHARMD 1811 03/09/17 A BROWNING    Enterobacter cloacae complex NOT DETECTED NOT DETECTED Final   Escherichia coli DETECTED (A) NOT DETECTED Final    Comment: CRITICAL RESULT CALLED TO, READ BACK BY AND VERIFIED WITH: A [facility] PHARMD 1811 03/09/17 A BROWNING    Klebsiella oxytoca NOT DETECTED NOT DETECTED Final   Klebsiella pneumoniae NOT DETECTED NOT DETECTED Final   Proteus species NOT DETECTED NOT DETECTED Final   Serratia marcescens NOT DETECTED NOT DETECTED Final   Carbapenem resistance NOT DETECTED NOT DETECTED Final   Haemophilus influenzae NOT DETECTED NOT DETECTED Final   Neisseria meningitidis NOT DETECTED NOT DETECTED Final   Pseudomonas  aeruginosa NOT DETECTED NOT DETECTED Final   Candida albicans NOT DETECTED NOT DETECTED Final   Candida glabrata NOT DETECTED NOT DETECTED Final   Candida krusei NOT DETECTED NOT DETECTED Final   Candida parapsilosis NOT DETECTED NOT DETECTED Final   Candida tropicalis NOT DETECTED NOT DETECTED Final    Comment: Performed at [facility], [address] [place], [place] [address]  Culture, blood (routine x 2)     Status: Abnormal   Collection Time: 03/08/17  9:06 PM  Result Value Ref Range Status   Specimen Description BLOOD LEFT HAND  Final   Special Requests   Final    BOTTLES DRAWN AEROBIC AND ANAEROBIC Blood Culture adequate volume   Culture  Setup Time   Final    GRAM NEGATIVE RODS AEROBIC BOTTLE ONLY Gram Stain  Report Called to,Read Back By and Verified With: CORCORAN R. AT 1350 ON 101118 BY THOMPSON S. IDENTIFICATION TO FOLLOW CRITICAL RESULT CALLED TO, READ BACK BY AND VERIFIED WITH: A [facility] PHARMD 1811 03/09/17 A BROWNING    Culture (A)  Final    ESCHERICHIA COLI SUSCEPTIBILITIES PERFORMED ON PREVIOUS CULTURE WITHIN THE LAST 5 DAYS. Performed at [facility], [address] [place], [place] [address]    Report Status 03/11/2017 FINAL  Final  Culture, blood (Routine X 2) w Reflex to ID Panel     Status: None (Preliminary result)   Collection Time: 03/10/17 10:59 AM  Result Value Ref Range Status   Specimen Description BLOOD LEFT HAND  Final   Special Requests   Final    BOTTLES DRAWN AEROBIC AND ANAEROBIC Blood Culture adequate volume   Culture   Final    NO GROWTH 2 DAYS Performed at [facility], [address] [place], [place] [address]    Report Status PENDING  Incomplete  Culture, blood (Routine X 2) w Reflex to ID Panel     Status: None (Preliminary result)   Collection Time: 03/10/17 10:59 AM  Result Value Ref Range Status   Specimen Description BLOOD RIGHT HAND  Final   Special Requests IN PEDIATRIC BOTTLE Blood Culture adequate volume   Final   Culture   Final    NO GROWTH 2 DAYS Performed at [facility], [address] [place], [place] [address]    Report Status PENDING  Incomplete     Labs: BNP (last 3 results) No results for input(s): BNP in the last 8760 hours. Basic Metabolic Panel:  Recent Labs Lab 03/08/17 1608 03/09/17 0155 03/10/17 1059  NA 134* 134* 137  K 3.5 3.9 3.5  CL 103 105 107  CO2 22 21* 24  GLUCOSE 125* 110* 159*  BUN 22* 19 20  CREATININE 1.08* 1.10* 0.97  CALCIUM 9.0 8.1* 8.6*   Liver Function Tests:  Recent Labs Lab 03/08/17 1608 03/09/17 0155  AST 24 26  ALT 15 16  ALKPHOS 84 59  BILITOT 1.3* 0.7  PROT 6.6 6.0*  ALBUMIN 3.4* 2.9*    Recent Labs Lab 03/08/17 1608  LIPASE 27   No results for input(s): AMMONIA in the last 168 hours. CBC:  Recent Labs Lab 03/08/17 1608 03/09/17 0155 03/10/17 1059 03/11/17 1129  WBC 15.6* 16.7* 17.5* 13.5*  NEUTROABS  --  16.2* 16.7*  --   HGB 11.6* 10.7* 10.9* 11.0*  HCT 34.9* 32.5* 33.3* 33.1*  MCV 89.9 90.8 90.0 89.7  PLT 167 151 149* 154   Cardiac Enzymes: No results for input(s): CKTOTAL, CKMB, CKMBINDEX, TROPONINI in the last 168 hours. BNP: Invalid input(s): POCBNP CBG:  Recent Labs Lab 03/09/17 0823 03/10/17 0835 03/11/17 0737  GLUCAP 120* 109* 110*   D-Dimer No results for input(s): DDIMER in the last 72 hours. Hgb A1c No results for input(s): HGBA1C in the last 72 hours. Lipid Profile No results for input(s): CHOL, HDL, LDLCALC, TRIG, CHOLHDL, LDLDIRECT in the last 72 hours. Thyroid function studies No results for input(s): TSH, T4TOTAL, T3FREE, THYROIDAB in the last 72 hours.  Invalid input(s): FREET3 Anemia work up No results for input(s): VITAMINB12, FOLATE, FERRITIN, TIBC, IRON, RETICCTPCT in the last 72 hours. Urinalysis    Component Value Date/Time   COLORURINE YELLOW 03/08/2017 1540   APPEARANCEUR CLEAR 03/08/2017 1540   LABSPEC 1.020 03/08/2017 1540   PHURINE 7.0 03/08/2017 1540  GLUCOSEU NEGATIVE 03/08/2017 1540   HGBUR NEGATIVE 03/08/2017 1540   BILIRUBINUR NEGATIVE 03/08/2017 1540   KETONESUR 5 (A) 03/08/2017 1540   PROTEINUR 30 (A) 03/08/2017 1540   NITRITE NEGATIVE 03/08/2017 1540   LEUKOCYTESUR NEGATIVE 03/08/2017 1540   Sepsis Labs Invalid input(s): PROCALCITONIN,  WBC,  LACTICIDVEN Microbiology Recent Results (from the past 240 hour(s))  Urine culture     Status: Abnormal   Collection Time: 03/08/17  3:40 PM  Result Value Ref Range Status   Specimen Description URINE, RANDOM  Final   Special Requests NONE  Final   Culture 20,000 COLONIES/mL ESCHERICHIA COLI (A)  Final   Report Status 03/11/2017 FINAL  Final   Organism ID, Bacteria ESCHERICHIA COLI (A)  Final      Susceptibility   Escherichia coli - MIC*    AMPICILLIN >=32 RESISTANT Resistant     CEFAZOLIN 16 SENSITIVE Sensitive     CEFTRIAXONE <=1 SENSITIVE Sensitive     CIPROFLOXACIN <=0.25 SENSITIVE Sensitive     GENTAMICIN <=1 SENSITIVE Sensitive     IMIPENEM <=0.25 SENSITIVE Sensitive     NITROFURANTOIN <=16 SENSITIVE Sensitive     TRIMETH/SULFA <=20 SENSITIVE Sensitive     AMPICILLIN/SULBACTAM 16 INTERMEDIATE Intermediate     PIP/TAZO <=4 SENSITIVE Sensitive     Extended ESBL NEGATIVE Sensitive     * 20,000 COLONIES/mL ESCHERICHIA COLI  Culture, blood (routine x 2)     Status: Abnormal   Collection Time: 03/08/17  8:59 PM  Result Value Ref Range Status   Specimen Description BLOOD LEFT HAND  Final   Special Requests   Final    BOTTLES DRAWN AEROBIC AND ANAEROBIC Blood Culture adequate volume   Culture  Setup Time   Final    GRAM NEGATIVE RODS AEROBIC BOTTLE ONLY Gram Stain Report Called to,Read Back By and Verified With: CORCORAN R. AT 1350 ON 101118 BY THOMPSON S. CRITICAL RESULT CALLED TO, READ BACK BY AND VERIFIED WITH: A [facility] PHARMD 1811 03/09/17 A BROWNING Performed at [facility], [address], [place], [place] [address]    Culture ESCHERICHIA COLI (A)  Final    Report Status 03/11/2017 FINAL  Final   Organism ID, Bacteria ESCHERICHIA COLI  Final      Susceptibility   Escherichia coli - MIC*    AMPICILLIN >=32 RESISTANT Resistant     CEFAZOLIN 16 SENSITIVE Sensitive     CEFEPIME <=1 SENSITIVE Sensitive     CEFTAZIDIME <=1 SENSITIVE Sensitive     CEFTRIAXONE <=1 SENSITIVE Sensitive     CIPROFLOXACIN <=0.25 SENSITIVE Sensitive     GENTAMICIN <=1 SENSITIVE Sensitive     IMIPENEM <=0.25 SENSITIVE Sensitive     TRIMETH/SULFA <=20 SENSITIVE Sensitive     AMPICILLIN/SULBACTAM 16 INTERMEDIATE Intermediate     PIP/TAZO <=4 SENSITIVE Sensitive     Extended ESBL NEGATIVE Sensitive     * ESCHERICHIA COLI  Blood Culture ID Panel (Reflexed)     Status: Abnormal   Collection Time: 03/08/17  8:59 PM  Result Value Ref Range Status   Enterococcus species NOT DETECTED NOT DETECTED Final   Listeria monocytogenes NOT DETECTED NOT DETECTED Final   Staphylococcus species NOT DETECTED NOT DETECTED Final   Staphylococcus aureus NOT DETECTED NOT DETECTED Final   Streptococcus species NOT DETECTED NOT DETECTED Final   Streptococcus agalactiae NOT DETECTED NOT DETECTED Final   Streptococcus pneumoniae NOT DETECTED NOT DETECTED Final   Streptococcus pyogenes NOT DETECTED NOT DETECTED Final   Acinetobacter baumannii NOT  DETECTED NOT DETECTED Final   Enterobacteriaceae species DETECTED (A) NOT DETECTED Final    Comment: Enterobacteriaceae represent a large family of gram-negative bacteria, not a single organism. CRITICAL RESULT CALLED TO, READ BACK BY AND VERIFIED WITH: A [facility] PHARMD 1811 03/09/17 A BROWNING    Enterobacter cloacae complex NOT DETECTED NOT DETECTED Final   Escherichia coli DETECTED (A) NOT DETECTED Final    Comment: CRITICAL RESULT CALLED TO, READ BACK BY AND VERIFIED WITH: A [facility] PHARMD 1811 03/09/17 A BROWNING    Klebsiella oxytoca NOT DETECTED NOT DETECTED Final   Klebsiella pneumoniae NOT DETECTED NOT DETECTED Final   Proteus species NOT  DETECTED NOT DETECTED Final   Serratia marcescens NOT DETECTED NOT DETECTED Final   Carbapenem resistance NOT DETECTED NOT DETECTED Final   Haemophilus influenzae NOT DETECTED NOT DETECTED Final   Neisseria meningitidis NOT DETECTED NOT DETECTED Final   Pseudomonas aeruginosa NOT DETECTED NOT DETECTED Final   Candida albicans NOT DETECTED NOT DETECTED Final   Candida glabrata NOT DETECTED NOT DETECTED Final   Candida krusei NOT DETECTED NOT DETECTED Final   Candida parapsilosis NOT DETECTED NOT DETECTED Final   Candida tropicalis NOT DETECTED NOT DETECTED Final    Comment: Performed at [facility], [address] [place], [place] [address]  Culture, blood (routine x 2)     Status: Abnormal   Collection Time: 03/08/17  9:06 PM  Result Value Ref Range Status   Specimen Description BLOOD LEFT HAND  Final   Special Requests   Final    BOTTLES DRAWN AEROBIC AND ANAEROBIC Blood Culture adequate volume   Culture  Setup Time   Final    GRAM NEGATIVE RODS AEROBIC BOTTLE ONLY Gram Stain Report Called to,Read Back By and Verified With: CORCORAN R. AT 1350 ON 101118 BY THOMPSON S. IDENTIFICATION TO FOLLOW CRITICAL RESULT CALLED TO, READ BACK BY AND VERIFIED WITH: A [facility] PHARMD 1811 03/09/17 A BROWNING    Culture (A)  Final    ESCHERICHIA COLI SUSCEPTIBILITIES PERFORMED ON PREVIOUS CULTURE WITHIN THE LAST 5 DAYS. Performed at [facility], [address], [place], [place] [address]    Report Status 03/11/2017 FINAL  Final  Culture, blood (Routine X 2) w Reflex to ID Panel     Status: None (Preliminary result)   Collection Time: 03/10/17 10:59 AM  Result Value Ref Range Status   Specimen Description BLOOD LEFT HAND  Final   Special Requests   Final    BOTTLES DRAWN AEROBIC AND ANAEROBIC Blood Culture adequate volume   Culture   Final    NO GROWTH 2 DAYS Performed at [facility], [address] [place], [place] [address]    Report Status PENDING  Incomplete  Culture,  blood (Routine X 2) w Reflex to ID Panel     Status: None (Preliminary result)   Collection Time: 03/10/17 10:59 AM  Result Value Ref Range Status   Specimen Description BLOOD RIGHT HAND  Final   Special Requests IN PEDIATRIC BOTTLE Blood Culture adequate volume  Final   Culture   Final    NO GROWTH 2 DAYS Performed at [facility], [address], [place], [place] [address]    Report Status PENDING  Incomplete     Time coordinating discharge: Over 30 minutes  SIGNED:   Kathlen Mody, MD  [facility] 03/13/2017, 8:53 AM Pager   If 7PM-7AM, please contact night-coverage [email or web address] Password [email or web address]

## 2017-03-15 LAB — CULTURE, BLOOD (ROUTINE X 2)
CULTURE: NO GROWTH
CULTURE: NO GROWTH
SPECIAL REQUESTS: ADEQUATE
Special Requests: ADEQUATE

## 2017-06-27 ENCOUNTER — Encounter: Payer: Self-pay | Admitting: Gastroenterology

## 2017-07-27 ENCOUNTER — Ambulatory Visit: Payer: Medicare Other

## 2017-08-21 ENCOUNTER — Encounter: Payer: Self-pay | Admitting: *Deleted

## 2017-08-21 ENCOUNTER — Ambulatory Visit: Payer: Medicare Other

## 2017-08-21 ENCOUNTER — Telehealth: Payer: Self-pay | Admitting: Gastroenterology

## 2017-08-21 NOTE — Telephone Encounter (Signed)
PATIENT WAS A NO SHOW AND LETTER SENT  °

## 2018-03-06 ENCOUNTER — Encounter (INDEPENDENT_AMBULATORY_CARE_PROVIDER_SITE_OTHER): Payer: Self-pay | Admitting: *Deleted

## 2019-06-06 ENCOUNTER — Other Ambulatory Visit (HOSPITAL_COMMUNITY): Payer: Self-pay | Admitting: Physician Assistant

## 2019-06-06 DIAGNOSIS — Z1231 Encounter for screening mammogram for malignant neoplasm of breast: Secondary | ICD-10-CM

## 2019-06-13 ENCOUNTER — Other Ambulatory Visit: Payer: Self-pay

## 2019-06-13 ENCOUNTER — Ambulatory Visit (HOSPITAL_COMMUNITY)
Admission: RE | Admit: 2019-06-13 | Discharge: 2019-06-13 | Disposition: A | Discharge status: Home / Self Care | Payer: Medicare PPO | Source: Ambulatory Visit | Attending: Physician Assistant | Admitting: Physician Assistant

## 2019-06-13 DIAGNOSIS — Z1231 Encounter for screening mammogram for malignant neoplasm of breast: Secondary | ICD-10-CM | POA: Diagnosis not present

## 2019-06-19 DIAGNOSIS — Z6832 Body mass index (BMI) 32.0-32.9, adult: Secondary | ICD-10-CM | POA: Diagnosis not present

## 2019-06-19 DIAGNOSIS — Z1211 Encounter for screening for malignant neoplasm of colon: Secondary | ICD-10-CM | POA: Diagnosis not present

## 2019-06-19 DIAGNOSIS — E6609 Other obesity due to excess calories: Secondary | ICD-10-CM | POA: Diagnosis not present

## 2019-06-19 DIAGNOSIS — I10 Essential (primary) hypertension: Secondary | ICD-10-CM | POA: Diagnosis not present

## 2019-08-26 DIAGNOSIS — Z6833 Body mass index (BMI) 33.0-33.9, adult: Secondary | ICD-10-CM | POA: Diagnosis not present

## 2019-08-26 DIAGNOSIS — I10 Essential (primary) hypertension: Secondary | ICD-10-CM | POA: Diagnosis not present

## 2019-08-26 DIAGNOSIS — Z0001 Encounter for general adult medical examination with abnormal findings: Secondary | ICD-10-CM | POA: Diagnosis not present

## 2019-08-26 DIAGNOSIS — E6609 Other obesity due to excess calories: Secondary | ICD-10-CM | POA: Diagnosis not present

## 2019-08-26 DIAGNOSIS — E669 Obesity, unspecified: Secondary | ICD-10-CM | POA: Diagnosis not present

## 2019-08-26 DIAGNOSIS — R011 Cardiac murmur, unspecified: Secondary | ICD-10-CM | POA: Diagnosis not present

## 2019-08-26 DIAGNOSIS — N342 Other urethritis: Secondary | ICD-10-CM | POA: Diagnosis not present

## 2019-08-26 DIAGNOSIS — R300 Dysuria: Secondary | ICD-10-CM | POA: Diagnosis not present

## 2019-08-26 DIAGNOSIS — Z1389 Encounter for screening for other disorder: Secondary | ICD-10-CM | POA: Diagnosis not present

## 2020-01-17 DIAGNOSIS — L309 Dermatitis, unspecified: Secondary | ICD-10-CM | POA: Diagnosis not present

## 2020-01-17 DIAGNOSIS — L905 Scar conditions and fibrosis of skin: Secondary | ICD-10-CM | POA: Diagnosis not present

## 2020-01-17 DIAGNOSIS — L821 Other seborrheic keratosis: Secondary | ICD-10-CM | POA: Diagnosis not present

## 2020-02-04 DIAGNOSIS — T1490XA Injury, unspecified, initial encounter: Secondary | ICD-10-CM | POA: Diagnosis not present

## 2020-02-04 DIAGNOSIS — L03116 Cellulitis of left lower limb: Secondary | ICD-10-CM | POA: Diagnosis not present

## 2020-02-04 DIAGNOSIS — Z6832 Body mass index (BMI) 32.0-32.9, adult: Secondary | ICD-10-CM | POA: Diagnosis not present

## 2020-02-04 DIAGNOSIS — I10 Essential (primary) hypertension: Secondary | ICD-10-CM | POA: Diagnosis not present

## 2020-02-24 DIAGNOSIS — I10 Essential (primary) hypertension: Secondary | ICD-10-CM | POA: Diagnosis not present

## 2020-02-24 DIAGNOSIS — T1490XS Injury, unspecified, sequela: Secondary | ICD-10-CM | POA: Diagnosis not present

## 2020-02-24 DIAGNOSIS — Z23 Encounter for immunization: Secondary | ICD-10-CM | POA: Diagnosis not present

## 2020-02-24 DIAGNOSIS — L03116 Cellulitis of left lower limb: Secondary | ICD-10-CM | POA: Diagnosis not present

## 2020-02-24 DIAGNOSIS — Z6832 Body mass index (BMI) 32.0-32.9, adult: Secondary | ICD-10-CM | POA: Diagnosis not present

## 2020-02-29 ENCOUNTER — Other Ambulatory Visit: Payer: Self-pay

## 2020-02-29 ENCOUNTER — Other Ambulatory Visit: Payer: Medicare PPO

## 2020-02-29 DIAGNOSIS — Z20822 Contact with and (suspected) exposure to covid-19: Secondary | ICD-10-CM | POA: Diagnosis not present

## 2020-03-01 LAB — SARS-COV-2, NAA 2 DAY TAT

## 2020-03-01 LAB — NOVEL CORONAVIRUS, NAA: SARS-CoV-2, NAA: NOT DETECTED

## 2020-03-05 ENCOUNTER — Ambulatory Visit (HOSPITAL_COMMUNITY): Payer: Medicare PPO | Attending: Internal Medicine | Admitting: Physical Therapy

## 2020-03-05 ENCOUNTER — Encounter (HOSPITAL_COMMUNITY): Payer: Self-pay | Admitting: Physical Therapy

## 2020-03-05 ENCOUNTER — Other Ambulatory Visit: Payer: Self-pay

## 2020-03-05 DIAGNOSIS — S81802A Unspecified open wound, left lower leg, initial encounter: Secondary | ICD-10-CM | POA: Insufficient documentation

## 2020-03-05 DIAGNOSIS — R262 Difficulty in walking, not elsewhere classified: Secondary | ICD-10-CM | POA: Diagnosis not present

## 2020-03-05 NOTE — Therapy (Signed)
[facility] [address] [place], [place], [address] Phone: [phone]   Fax:  [phone]  Wound Care Evaluation  Patient Details  Name: Jody Hernandez MRN: [id number] Date of Birth: 11/05/1950 Referring Provider (PT): Elfredia Nevins   Encounter Date: 03/05/2020   PT End of Session - 03/05/20 1038    Visit Number 1    Number of Visits 12    Date for PT Re-Evaluation 04/16/20    Authorization Type human[employer] medic[employer], auth required and submitted. check auth    Progress Note Due on Visit 10    PT Start Time 1045    PT Stop Time 1125    PT Time Calculation (min) 40 min    Activity Tolerance Patient tolerated treatment well    Behavior During Therapy WFL for tasks assessed/performed           Past Medical History:  Diagnosis Date  . Hypertension     Past Surgical History:  Procedure Laterality Date  . BREAST SURGERY     lumpectomy  . CYSTOSCOPY WITH STENT PLACEMENT Left 03/09/2017   Procedure: CYSTOSCOPY, RETROGRADE PYELOGRAM WITH STENT PLACEMENT;  Surgeon: Alfredo Martinez, MD;  Location: [facility];  Service: Urology;  Laterality: Left;    There were no vitals filed for this visit.     [facility] PT Assessment - 03/05/20 0001      Assessment   Medical Diagnosis L leg wound    Referring Provider (PT) Elfredia Nevins    Prior Therapy not for leg           Wound Therapy - 03/05/20 0001    Subjective Patient reports that she had a spot on her leg removed at the dermatologist office and that was over a month ago. States it is not healing, her MD had her try peroxide, antibiotic cream and that is not helping. Reports that there was redness streaking down her leg but her MD put her on antibiotics and it's a lot better but the antibiotics are done tomorrow. States that it is painful to touch and walk. Reports it was a small spot but now it is larger. States she has had a lot of swelling in her leg and foot, normally she has some swelling  but this has been really bad since the biopsy.     Patient and Family Stated Goals to have wound heal    Date of Onset --   about a month ago   Prior Treatments antibiotic cream, peroxide, bandaid    Pain Scale 0-10    Pain Score 2     Pain Type Acute pain    Pain Location Leg    Pain Orientation Left;Anterior    Pain Descriptors / Indicators Shooting    Pain Onset With Activity    Patients Stated Pain Goal 0    Pain Intervention(s) Emotional support    Evaluation and Treatment Procedures Explained to Patient/Family Yes    Evaluation and Treatment Procedures agreed to    Wound Properties Date First Assessed: 03/05/20 Time First Assessed: 1045 Wound Type: Incision - Open Location: Leg Location Orientation: Left;Anterior;Lower Wound Description (Comments): anterior lower leg Present on Admission: Yes   Dressing Type --   bandaid   Dressing Changed Changed    Dressing Status Old drainage    Dressing Change Frequency PRN    Site / Wound Assessment Yellow;Painful    % Wound base Yellow/Fibrinous Exudate 100%    Peri-wound Assessment Edema;Maceration;Erythema (blanchable)  Wound Length (cm) 1.2 cm    Wound Width (cm) 2 cm    Wound Depth (cm) 0.8 cm    Wound Volume (cm^3) 1.92 cm^3    Wound Surface Area (cm^2) 2.4 cm^2    Margins Unattached edges (unapproximated)    Drainage Amount Scant    Drainage Description Serosanguineous    Treatment Cleansed;Debridement (Selective)    Selective Debridement - Location left lower leg    Selective Debridement - Tools Used Forceps;Scalpel    Selective Debridement - Tissue Removed devitalized tissue    Wound Therapy - Clinical Statement Patient presents with wound on left lower leg after biopsy at dermatologist office a month ago that has failed to heal. Wound presents with redness around wound bed with it extending 2.2 cm on the medial (9 o'clock) side, 1.0 on the lateral side (3 o'clock) and .5 superiorly and inferiorly (12 and 6 o'clock). Patient  currently on antibiotics with reports that redness has significantly improved. Educated patient on signs and symptoms of infection progressing and what to do if symptoms present. Answered all questions and concerns. Applied Vaseline to border of wound and medihoney to wound bed. Applied profore secondary to swelling and no contraindications noted. Educated patient in how and when to take compression garment off if needed. Patient reported comfort with compression bandage. Patient would benefit from skilled physical therapy to improve functional mobility and allow optimal environment for wound to heal.     Wound Therapy - Functional Problem List difficulty walking    Factors Delaying/Impairing Wound Healing Vascular compromise   varicose veins   Wound Therapy - Frequency 2X / week    Wound Therapy - Current Recommendations PT    Wound Plan continue wtih debridement and dressing changes    Dressing  medihoney on 2x2, vaseoline around wound, profore, netting #5                 Objective measurements completed on examination: See above findings.             PT Education - 03/05/20 1126    Education Details on current condition. On what to do if localized infection gets worse and sign/symptoms of progressing infection. Of what to do and how to doff compression garment (one layer at a time) if compression is too much and or the bandages get soaked through    [employer]) Educated Patient    Methods Explanation    Comprehension Verbalized understanding            PT Short Term Goals - 03/05/20 1304      PT SHORT TERM GOAL #1   Title Patient will have no pain with waking to improve ability to walk around    Time 3    Period Weeks    Status New    Target Date 03/26/20      PT SHORT TERM GOAL #2   Title Wound will be 100% granulated    Time 3    Period Weeks    Status New    Target Date 03/26/20             PT Long Term Goals - 03/05/20 1308      PT LONG TERM GOAL #1    Title Patient will demonstrate ability to wear compression socks to reduce swelling in bilateral lower extremities.    Time 6    Period Weeks    Status New    Target Date 04/16/20      PT  LONG TERM GOAL #2   Title Wound will be completely healed    Time 6    Period Weeks    Status New    Target Date 04/16/20                Plan - 03/05/20 1125    Clinical Impression Statement see above    Personal Factors and Comorbidities Comorbidity 1    Comorbidities varicose veins, HTN    Stability/Clinical Decision Making Stable/Uncomplicated    Clinical Decision Making Low    Rehab Potential Good    PT Frequency 2x / week    PT Duration 6 weeks    PT Treatment/Interventions ADLs/Self Care Home Management;Manual techniques;Other (comment);Compression bandaging   debridement   PT Next Visit Plan continue with compression and dressing changes    Consulted and Agree with Plan of Care Patient           Patient will benefit from skilled therapeutic intervention in order to improve the following deficits and impairments:  Pain, Increased edema, Difficulty walking, Decreased skin integrity  Visit Diagnosis: Wound of left lower extremity, initial encounter  Difficulty in walking, not elsewhere classified    Problem List Patient Active Problem List   Diagnosis Date Noted  . Sepsis (HCC) 03/09/2017  . Ureteral stone with hydronephrosis   . Hypertension 03/08/2017  . Sepsis secondary to UTI (HCC) 03/08/2017  . Urinary tract obstruction by kidney stone 03/08/2017   1:13 PM, 03/05/20 Tereasa Coop, DPT Physical Therapy with [facility]  [phone] office  [facility] [address] [place], [place], [address] Phone: [phone]   Fax:  [phone]  Name: Jody Hernandez MRN: [id number] Date of Birth: Mar 09, 1951

## 2020-03-10 ENCOUNTER — Other Ambulatory Visit: Payer: Self-pay

## 2020-03-10 ENCOUNTER — Ambulatory Visit (HOSPITAL_COMMUNITY): Payer: Medicare PPO | Admitting: Physical Therapy

## 2020-03-10 ENCOUNTER — Encounter (HOSPITAL_COMMUNITY): Payer: Self-pay | Admitting: Physical Therapy

## 2020-03-10 DIAGNOSIS — S81802A Unspecified open wound, left lower leg, initial encounter: Secondary | ICD-10-CM

## 2020-03-10 DIAGNOSIS — R262 Difficulty in walking, not elsewhere classified: Secondary | ICD-10-CM | POA: Diagnosis not present

## 2020-03-10 NOTE — Therapy (Addendum)
[facility] [address] [place], [place], [address] Phone: [phone]   Fax:  [phone]  Wound Care Therapy  Patient Details  Name: Jody Hernandez MRN: [id number] Date of Birth: 04-07-1951 Referring Provider (PT): Elfredia Nevins   Encounter Date: 03/10/2020    PT End of Session - 03/10/20 1358         Visit Number 2     Number of Visits 12     Date for PT Re-Evaluation 04/16/20     Authorization Type [employer], auth required and submitted. check auth     Progress Note Due on Visit 10     PT Start Time 1345    PT Stop Time 1430     PT Time Calculation (min) 45 min     Activity Tolerance Patient tolerated treatment well     Behavior During Therapy WFL for tasks assessed/performed         Past Medical History:  Diagnosis Date  . Hypertension     Past Surgical History:  Procedure Laterality Date  . BREAST SURGERY     lumpectomy  . CYSTOSCOPY WITH STENT PLACEMENT Left 03/09/2017   Procedure: CYSTOSCOPY, RETROGRADE PYELOGRAM WITH STENT PLACEMENT;  Surgeon: Alfredo Martinez, MD;  Location: [facility];  Service: Urology;  Laterality: Left;    There were no vitals filed for this visit.               Wound Therapy - 03/10/20 1358    Subjective pt states her dressing slid down some but overall did well.     Patient and Family Stated Goals to have wound heal    Date of Onset --   about a month ago   Prior Treatments antibiotic cream, peroxide, bandaid    Pain Scale 0-10    Pain Score 2     Pain Type Acute pain    Pain Location Leg    Pain Orientation Left;Anterior    Pain Descriptors / Indicators Shooting    Pain Onset With Activity    Patients Stated Pain Goal 0    Evaluation and Treatment Procedures Explained to Patient/Family Yes    Evaluation and Treatment Procedures agreed to    Wound Properties Date First Assessed: 03/05/20 Time First Assessed: 1045 Wound Type: Incision - Open Location: Leg  Location Orientation: Left;Anterior;Lower Wound Description (Comments): anterior lower leg Present on Admission: Yes   Dressing Type Gauze (Comment);Compression wrap   medihoney and profore   Dressing Changed Changed    Dressing Status Old drainage    Dressing Change Frequency PRN    Site / Wound Assessment Yellow;Red;Pink    % Wound base Red or Granulating 40%    % Wound base Yellow/Fibrinous Exudate 60%    Peri-wound Assessment Erythema (blanchable)    Wound Length (cm) 1.7 cm    Wound Width (cm) 1.4 cm    Wound Depth (cm) 0.4 cm    Wound Volume (cm^3) 0.95 cm^3    Wound Surface Area (cm^2) 2.38 cm^2    Margins Attached edges (approximated)    Drainage Amount Minimal    Drainage Description Serosanguineous    Treatment Cleansed;Debridement (Selective)    Selective Debridement - Location left lower leg    Selective Debridement - Tools Used Forceps;Scalpel    Selective Debridement - Tissue Removed devitalized tissue    Wound Therapy - Clinical Statement Wound measured today.  Has 2.4cm of erythema perimeter of wound, however reports this looks better than last  session.  Pt has just finished antibiotics so will keep an eye on this. Wound with Increased length but much improved width and depth this session.  Wound also 40% granulated following debridement today in comparison to 0% granulation at evaluation.   Pt with some discomfort with debridement, however tolerated well overall.   Continued with Vaseline to wound borders and rest of LE following cleansing.  Medihoney and profore used to soften eschar and reduce edema.  Pt reported overall comfort with bandaging.  Explained she may remove dressing layer by layer if becomes too tightn or uncomfortable.     Wound Therapy - Functional Problem List difficulty walking    Factors Delaying/Impairing Wound Healing Vascular compromise   varicose veins   Wound Therapy - Frequency 2X / week    Wound Therapy - Current Recommendations PT    Wound Plan  continue with debridement and dressing changes.  Measure weekly.     Dressing  medihoney on 2x2, vaseline around wound, profore, netting #5                     PT Short Term Goals - 03/05/20 1304      PT SHORT TERM GOAL #1   Title Patient will have no pain with waking to improve ability to walk around    Time 3    Period Weeks    Status New    Target Date 03/26/20      PT SHORT TERM GOAL #2   Title Wound will be 100% granulated    Time 3    Period Weeks    Status New    Target Date 03/26/20             PT Long Term Goals - 03/05/20 1308      PT LONG TERM GOAL #1   Title Patient will demonstrate ability to wear compression socks to reduce swelling in bilateral lower extremities.    Time 6    Period Weeks    Status New    Target Date 04/16/20      PT LONG TERM GOAL #2   Title Wound will be completely healed    Time 6    Period Weeks    Status New    Target Date 04/16/20                  Patient will benefit from skilled therapeutic intervention in order to improve the following deficits and impairments:     Visit Diagnosis: Wound of left lower extremity, initial encounter  Difficulty in walking, not elsewhere classified     Problem List Patient Active Problem List   Diagnosis Date Noted  . Sepsis (HCC) 03/09/2017  . Ureteral stone with hydronephrosis   . Hypertension 03/08/2017  . Sepsis secondary to UTI (HCC) 03/08/2017  . Urinary tract obstruction by kidney stone 03/08/2017   Lurena Nida, PTA/CLT [phone]  Lurena Nida 03/10/2020, 2:15 PM  [place] [facility] [address] [place], [place], [address] Phone: [phone]   Fax:  [phone]  Name: Jody Hernandez MRN: [id number] Date of Birth: 07/07/1950

## 2020-03-11 ENCOUNTER — Ambulatory Visit (HOSPITAL_COMMUNITY): Payer: Medicare PPO | Admitting: Physical Therapy

## 2020-03-13 ENCOUNTER — Other Ambulatory Visit: Payer: Self-pay

## 2020-03-13 ENCOUNTER — Other Ambulatory Visit (HOSPITAL_COMMUNITY)
Admission: RE | Admit: 2020-03-13 | Discharge: 2020-03-13 | Disposition: A | Discharge status: Home / Self Care | Payer: Medicare PPO | Source: Ambulatory Visit | Attending: Internal Medicine | Admitting: Internal Medicine

## 2020-03-13 ENCOUNTER — Ambulatory Visit (HOSPITAL_COMMUNITY): Payer: Medicare PPO | Admitting: Physical Therapy

## 2020-03-13 ENCOUNTER — Encounter (HOSPITAL_COMMUNITY): Payer: Self-pay | Admitting: Physical Therapy

## 2020-03-13 DIAGNOSIS — S81802A Unspecified open wound, left lower leg, initial encounter: Secondary | ICD-10-CM | POA: Insufficient documentation

## 2020-03-13 DIAGNOSIS — X58XXXA Exposure to other specified factors, initial encounter: Secondary | ICD-10-CM | POA: Insufficient documentation

## 2020-03-13 DIAGNOSIS — R262 Difficulty in walking, not elsewhere classified: Secondary | ICD-10-CM

## 2020-03-13 NOTE — Therapy (Addendum)
[place] [facility] [address] [place], [place], [address] Phone: [phone]   Fax:  [phone]  Wound Care Therapy  Patient Details  Name: Jody Hernandez MRN: [id number] Date of Birth: 1950-09-16 Referring Provider (PT): Elfredia Nevins   Encounter Date: 03/13/2020   PT End of Session - 03/13/20 1424    Visit Number 3    Number of Visits 12    Date for PT Re-Evaluation 04/16/20    Authorization Type [employer] medic[employer], auth required and submitted. check auth    Progress Note Due on Visit 10    PT Start Time 1315    PT Stop Time 1405    PT Time Calculation (min) 50 min    Activity Tolerance Patient tolerated treatment well    Behavior During Therapy WFL for tasks assessed/performed           Past Medical History:  Diagnosis Date  . Hypertension     Past Surgical History:  Procedure Laterality Date  . BREAST SURGERY     lumpectomy  . CYSTOSCOPY WITH STENT PLACEMENT Left 03/09/2017   Procedure: CYSTOSCOPY, RETROGRADE PYELOGRAM WITH STENT PLACEMENT;  Surgeon: Alfredo Martinez, MD;  Location: [facility];  Service: Urology;  Laterality: Left;    There were no vitals filed for this visit.      [facility] PT Assessment - 03/13/20 0001      Assessment   Medical Diagnosis L leg wound    Referring Provider (PT) Elfredia Nevins                   Wound Therapy - 03/13/20 0001    Subjective STates dressing rolled up from her toes. Reports overall comfort in dressing from last session. Occassional pain that zings up her leg but no current pain     Patient and Family Stated Goals to have wound heal    Date of Onset --   about a month ago   Prior Treatments antibiotic cream, peroxide, bandaid    Pain Scale Faces    Faces Pain Scale Hurts whole lot    Pain Type Acute pain    Pain Location Leg    Pain Orientation Left;Anterior;Lower    Pain Descriptors / Indicators Shooting    Pain Onset With Activity    Pain Intervention(s) Emotional  support    Evaluation and Treatment Procedures Explained to Patient/Family Yes    Evaluation and Treatment Procedures agreed to    Wound Properties Date First Assessed: 03/05/20 Time First Assessed: 1045 Wound Type: Incision - Open Location: Leg Location Orientation: Left;Anterior;Lower Wound Description (Comments): anterior lower leg Present on Admission: Yes   Dressing Type Gauze (Comment);Compression wrap   medihoney on gauze   Dressing Changed Changed    Dressing Status Old drainage    Dressing Change Frequency PRN    Site / Wound Assessment Painful;Pink;Yellow    % Wound base Red or Granulating 40%    % Wound base Yellow/Fibrinous Exudate 60%    Peri-wound Assessment Erythema (blanchable);Pink    Margins Epibole (rolled edges)    Drainage Amount Minimal    Drainage Description Serosanguineous    Treatment Debridement (Selective);Cleansed    Selective Debridement - Location left lower leg    Selective Debridement - Tools Used Forceps;Scalpel    Selective Debridement - Tissue Removed devitalized tissue    Wound Therapy - Clinical Statement Redness around immediate wound more pronounced on this date (darker) but with it still measuring about the same extending  from wound. Called MD and obtained order for wound culture to rule out infection. Will follow up with culture results next session. Reviewed signs and symptoms of systemic infection and what to do if symptoms present. Continued with medihoney on gauze and profore. Very painful during debridement but able to clean up edges and slough. Epibole edges noted around entire wound. Will continue with current POC.     Wound Therapy - Functional Problem List difficulty walking    Factors Delaying/Impairing Wound Healing Vascular compromise    Wound Therapy - Frequency 2X / week    Wound Therapy - Current Recommendations PT    Wound Plan continue with debridement and dressing changes.  Measure weekly.     Dressing  medihoney on 2x2, vaseline  around wound, profore, netting #5                     PT Short Term Goals - 03/05/20 1304      PT SHORT TERM GOAL #1   Title Patient will have no pain with waking to improve ability to walk around    Time 3    Period Weeks    Status New    Target Date 03/26/20      PT SHORT TERM GOAL #2   Title Wound will be 100% granulated    Time 3    Period Weeks    Status New    Target Date 03/26/20             PT Long Term Goals - 03/05/20 1308      PT LONG TERM GOAL #1   Title Patient will demonstrate ability to wear compression socks to reduce swelling in bilateral lower extremities.    Time 6    Period Weeks    Status New    Target Date 04/16/20      PT LONG TERM GOAL #2   Title Wound will be completely healed    Time 6    Period Weeks    Status New    Target Date 04/16/20                  Patient will benefit from skilled therapeutic intervention in order to improve the following deficits and impairments:     Visit Diagnosis: Wound of left lower extremity, initial encounter  Difficulty in walking, not elsewhere classified     Problem List Patient Active Problem List   Diagnosis Date Noted  . Sepsis (HCC) 03/09/2017  . Ureteral stone with hydronephrosis   . Hypertension 03/08/2017  . Sepsis secondary to UTI (HCC) 03/08/2017  . Urinary tract obstruction by kidney stone 03/08/2017    2:24 PM, 03/13/20 Tereasa Coop, DPT Physical Therapy with [facility]  [phone] office  [facility] [address] [place], [place], [address] Phone: [phone]   Fax:  [phone]  Name: Jody Hernandez MRN: [id number] Date of Birth: 1950-09-09

## 2020-03-15 LAB — AEROBIC CULTURE  (SUPERFICIAL SPECIMEN)

## 2020-03-16 ENCOUNTER — Encounter (HOSPITAL_COMMUNITY): Payer: Self-pay | Admitting: Physical Therapy

## 2020-03-16 ENCOUNTER — Other Ambulatory Visit: Payer: Self-pay

## 2020-03-16 ENCOUNTER — Ambulatory Visit (HOSPITAL_COMMUNITY): Payer: Medicare PPO | Admitting: Physical Therapy

## 2020-03-16 DIAGNOSIS — S81802A Unspecified open wound, left lower leg, initial encounter: Secondary | ICD-10-CM | POA: Diagnosis not present

## 2020-03-16 DIAGNOSIS — R262 Difficulty in walking, not elsewhere classified: Secondary | ICD-10-CM | POA: Diagnosis not present

## 2020-03-16 LAB — AEROBIC CULTURE W GRAM STAIN (SUPERFICIAL SPECIMEN): Gram Stain: NONE SEEN

## 2020-03-16 LAB — AEROBIC CULTURE  (SUPERFICIAL SPECIMEN)

## 2020-03-16 NOTE — Therapy (Signed)
[facility] [address] [place], [place], [address] Phone: [phone]   Fax:  [phone]  Wound Care Therapy  Patient Details  Name: Jody Hernandez MRN: [id number] Date of Birth: 1950-06-15 Referring Provider (PT): Elfredia Nevins   Encounter Date: 03/16/2020   PT End of Session - 03/16/20 1240    Visit Number 4    Number of Visits 12    Date for PT Re-Evaluation 04/16/20    Authorization Type human[employer] medicare, auth required and submitted. check auth    Progress Note Due on Visit 10    PT Start Time 1130    PT Stop Time 1200    PT Time Calculation (min) 30 min    Activity Tolerance Patient tolerated treatment well    Behavior During Therapy WFL for tasks assessed/performed           Past Medical History:  Diagnosis Date  . Hypertension     Past Surgical History:  Procedure Laterality Date  . BREAST SURGERY     lumpectomy  . CYSTOSCOPY WITH STENT PLACEMENT Left 03/09/2017   Procedure: CYSTOSCOPY, RETROGRADE PYELOGRAM WITH STENT PLACEMENT;  Surgeon: Alfredo Martinez, MD;  Location: [facility];  Service: Urology;  Laterality: Left;    There were no vitals filed for this visit.      [facility] PT Assessment - 03/16/20 0001      Assessment   Medical Diagnosis L leg wound    Referring Provider (PT) Elfredia Nevins                   Wound Therapy - 03/16/20 0001    Subjective Reports painin leg when she is up on it a lot but no current pain.     Patient and Family Stated Goals to have wound heal    Prior Treatments antibiotic cream, peroxide, bandaid    Pain Scale Faces    Pain Score 7     Pain Type Acute pain    Pain Location Leg    Pain Orientation Left;Anterior;Lower    Pain Descriptors / Indicators Shooting    Pain Onset With Activity    Pain Intervention(s) Emotional support    Evaluation and Treatment Procedures Explained to Patient/Family Yes    Evaluation and Treatment Procedures agreed to    Wound  Properties Date First Assessed: 03/05/20 Time First Assessed: 1045 Wound Type: Incision - Open Location: Leg Location Orientation: Left;Anterior;Lower Wound Description (Comments): anterior lower leg Present on Admission: Yes   Dressing Type Gauze (Comment);Compression wrap   medihoney   Dressing Changed Changed    Dressing Status Old drainage    Dressing Change Frequency PRN    Site / Wound Assessment Painful;Yellow;Pink    % Wound base Red or Granulating 75%    % Wound base Yellow/Fibrinous Exudate 25%    Peri-wound Assessment Erythema (blanchable)    Wound Length (cm) 1.4 cm   was 1.7   Wound Width (cm) 1.2 cm   was 1.4   Wound Depth (cm) 0.3 cm   was .4   Wound Volume (cm^3) 0.5 cm^3    Wound Surface Area (cm^2) 1.68 cm^2    Margins Epibole (rolled edges)    Drainage Amount Minimal    Drainage Description Serosanguineous    Treatment Cleansed;Debridement (Selective)    Selective Debridement - Location left lower leg    Selective Debridement - Tools Used Forceps;Scalpel    Selective Debridement - Tissue Removed devitalized tissue    Wound  Therapy - Clinical Statement Redness continues around wound but has not increased in size. No call from MD yet, instructed patient to call MD about wound culture. Wound continues to approximate and fill in. tolerated debridement better compared to previous sessions. Will continue with current medihoney on gauze and profore.     Wound Therapy - Functional Problem List difficulty walking    Factors Delaying/Impairing Wound Healing Vascular compromise    Wound Therapy - Frequency 2X / week    Wound Therapy - Current Recommendations PT    Wound Plan continue with debridement and dressing changes.  Measure weekly.     Dressing  medihoney on 2x2, vaseline around wound, profore, netting #5                     PT Short Term Goals - 03/05/20 1304      PT SHORT TERM GOAL #1   Title Patient will have no pain with waking to improve ability to  walk around    Time 3    Period Weeks    Status New    Target Date 03/26/20      PT SHORT TERM GOAL #2   Title Wound will be 100% granulated    Time 3    Period Weeks    Status New    Target Date 03/26/20             PT Long Term Goals - 03/05/20 1308      PT LONG TERM GOAL #1   Title Patient will demonstrate ability to wear compression socks to reduce swelling in bilateral lower extremities.    Time 6    Period Weeks    Status New    Target Date 04/16/20      PT LONG TERM GOAL #2   Title Wound will be completely healed    Time 6    Period Weeks    Status New    Target Date 04/16/20                 Plan - 03/16/20 1306    Clinical Impression Statement see above    Personal Factors and Comorbidities Comorbidity 1    Comorbidities varicose veins, HTN    Stability/Clinical Decision Making Stable/Uncomplicated    Rehab Potential Good    PT Frequency 2x / week    PT Duration 6 weeks    PT Treatment/Interventions ADLs/Self Care Home Management;Manual techniques;Other (comment);Compression bandaging   debridement   PT Next Visit Plan continue with compression and dressing changes    Consulted and Agree with Plan of Care Patient           Patient will benefit from skilled therapeutic intervention in order to improve the following deficits and impairments:  Pain, Increased edema, Difficulty walking, Decreased skin integrity  Visit Diagnosis: Wound of left lower extremity, initial encounter  Difficulty in walking, not elsewhere classified     Problem List Patient Active Problem List   Diagnosis Date Noted  . Sepsis (HCC) 03/09/2017  . Ureteral stone with hydronephrosis   . Hypertension 03/08/2017  . Sepsis secondary to UTI (HCC) 03/08/2017  . Urinary tract obstruction by kidney stone 03/08/2017   1:07 PM, 03/16/20 Tereasa Coop, DPT Physical Therapy with [facility]  [phone] office  [facility] [address] [place], [place], [address] Phone: [phone]   Fax:  [phone]  Name: Jody Hernandez MRN: [id number] Date of  Birth: [birth year]

## 2020-03-20 ENCOUNTER — Encounter (HOSPITAL_COMMUNITY): Payer: Self-pay | Admitting: Physical Therapy

## 2020-03-20 ENCOUNTER — Ambulatory Visit (HOSPITAL_COMMUNITY): Payer: Medicare PPO | Admitting: Physical Therapy

## 2020-03-20 ENCOUNTER — Other Ambulatory Visit: Payer: Self-pay

## 2020-03-20 DIAGNOSIS — R262 Difficulty in walking, not elsewhere classified: Secondary | ICD-10-CM | POA: Diagnosis not present

## 2020-03-20 DIAGNOSIS — S81802A Unspecified open wound, left lower leg, initial encounter: Secondary | ICD-10-CM | POA: Diagnosis not present

## 2020-03-20 NOTE — Therapy (Signed)
[facility] [address] [place], [place], [address] Phone: [phone]   Fax:  [phone]  Wound Care Therapy  Patient Details  Name: Jody Hernandez MRN: [id number] Date of Birth: 11/20/1950 Referring Provider (PT): Elfredia Nevins   Encounter Date: 03/20/2020   PT End of Session - 03/20/20 1213    Visit Number 5    Number of Visits 12    Date for PT Re-Evaluation 04/16/20    Authorization Type human[employer] medicare, auth required and submitted. check auth    Progress Note Due on Visit 10    PT Start Time 1130    PT Stop Time 1205    PT Time Calculation (min) 35 min    Activity Tolerance Patient tolerated treatment well    Behavior During Therapy WFL for tasks assessed/performed           Past Medical History:  Diagnosis Date   Hypertension     Past Surgical History:  Procedure Laterality Date   BREAST SURGERY     lumpectomy   CYSTOSCOPY WITH STENT PLACEMENT Left 03/09/2017   Procedure: CYSTOSCOPY, RETROGRADE PYELOGRAM WITH STENT PLACEMENT;  Surgeon: Alfredo Martinez, MD;  Location: [facility];  Service: Urology;  Laterality: Left;    There were no vitals filed for this visit.               Wound Therapy - 03/20/20 0001    Subjective PT states that she has no pain today     Patient and Family Stated Goals to have wound heal    Prior Treatments antibiotic cream, peroxide, bandaid    Pain Scale 0-10    Pain Score 0-No pain    Evaluation and Treatment Procedures Explained to Patient/Family Yes    Evaluation and Treatment Procedures agreed to    Wound Properties Date First Assessed: 03/05/20 Time First Assessed: 1045 Wound Type: Incision - Open Location: Leg Location Orientation: Left;Anterior;Lower Wound Description (Comments): anterior lower leg Present on Admission: Yes   Dressing Type Compression wrap;Gauze (Comment)    Dressing Changed Changed    Dressing Status Old drainage    Dressing Change Frequency PRN     Site / Wound Assessment Granulation tissue;Yellow    % Wound base Red or Granulating 90%    % Wound base Yellow/Fibrinous Exudate 10%    Wound Length (cm) 1.2 cm    Wound Width (cm) 1.2 cm    Wound Depth (cm) 0.3 cm    Wound Volume (cm^3) 0.43 cm^3    Wound Surface Area (cm^2) 1.44 cm^2    Margins Epibole (rolled edges)    Drainage Amount Scant    Drainage Description Serous    Treatment Cleansed;Debridement (Selective)    Selective Debridement - Location wound bed     Selective Debridement - Tools Used Forceps    Selective Debridement - Tissue Removed epiboled edges, devitalized tissue    Wound Therapy - Clinical Statement Pt educated about the need for her to wear compresion on LE due to her varicose insufficiency.  Pt would most likley do better with thigh high but knee high would be acceptable.  Pt measured and given sheet for elastic therapy.  No rednes noted around wound today.  Wound bed is looking healthy but has epiboled edges which needed to be debrided.      Wound Therapy - Functional Problem List difficulty walking    Factors Delaying/Impairing Wound Healing Vascular compromise    Wound Therapy - Frequency 2X /  week    Wound Therapy - Current Recommendations PT    Wound Plan continue with debridement and dressing changes.  Measure weekly.     Dressing  medihoney on 2x2, vaseline around wound, profore, netting #5                     PT Short Term Goals - 03/20/20 1215      PT SHORT TERM GOAL #1   Title Patient will have no pain with waking to improve ability to walk around    Time 3    Period Weeks    Status Partially Met    Target Date 03/26/20      PT SHORT TERM GOAL #2   Title Wound will be 100% granulated    Time 3    Period Weeks    Status On-going    Target Date 03/26/20             PT Long Term Goals - 03/20/20 1216      PT LONG TERM GOAL #1   Title Patient will demonstrate ability to wear compression socks to reduce swelling in bilateral  lower extremities.    Time 6    Period Weeks    Status On-going      PT LONG TERM GOAL #2   Title Wound will be completely healed    Time 6    Period Weeks    Status On-going                 Plan - 03/20/20 1213    Clinical Impression Statement see above    Personal Factors and Comorbidities Comorbidity 1    Comorbidities varicose veins, HTN    Stability/Clinical Decision Making Stable/Uncomplicated    Rehab Potential Good    PT Frequency 2x / week    PT Duration 6 weeks    PT Treatment/Interventions ADLs/Self Care Home Management;Manual techniques;Other (comment);Compression bandaging   debridement   PT Next Visit Plan continue with compression and dressing changes    Consulted and Agree with Plan of Care Patient           Patient will benefit from skilled therapeutic intervention in order to improve the following deficits and impairments:  Pain, Increased edema, Difficulty walking, Decreased skin integrity  Visit Diagnosis: Wound of left lower extremity, initial encounter  Difficulty in walking, not elsewhere classified     Problem List Patient Active Problem List   Diagnosis Date Noted   Sepsis ([place]) 03/09/2017   Ureteral stone with hydronephrosis    Hypertension 03/08/2017   Sepsis secondary to UTI ([place]) 03/08/2017   Urinary tract obstruction by kidney stone 03/08/2017   Rayetta Humphrey, PT CLT [id number] 03/20/2020, 12:16 PM  [place], [place], [address] Phone: [phone]   Fax:  [phone]  Name: CARLE DARGAN MRN: [id number] Date of Birth: 10/19/1950

## 2020-03-23 ENCOUNTER — Other Ambulatory Visit: Payer: Self-pay

## 2020-03-23 ENCOUNTER — Ambulatory Visit (HOSPITAL_COMMUNITY): Payer: Medicare PPO | Admitting: Physical Therapy

## 2020-03-23 ENCOUNTER — Encounter (HOSPITAL_COMMUNITY): Payer: Self-pay | Admitting: Physical Therapy

## 2020-03-23 DIAGNOSIS — R262 Difficulty in walking, not elsewhere classified: Secondary | ICD-10-CM | POA: Diagnosis not present

## 2020-03-23 DIAGNOSIS — S81802A Unspecified open wound, left lower leg, initial encounter: Secondary | ICD-10-CM | POA: Diagnosis not present

## 2020-03-23 NOTE — Therapy (Signed)
[place], [place], [address] Phone: [phone]   Fax:  [phone]  Wound Care Therapy  Patient Details  Name: Jody Hernandez MRN: [id number] Date of Birth: 31-Aug-1950 Referring Provider (PT): [employer]   Encounter Date: 03/23/2020   PT End of Session - 03/23/20 0951    Visit Number 6    Number of Visits 12    Date for PT Re-Evaluation 04/16/20    Authorization Type human[employer] medicare, auth required and submitted. check auth    Progress Note Due on Visit 10    PT Start Time 0915    PT Stop Time 0950    PT Time Calculation (min) 35 min    Activity Tolerance Patient tolerated treatment well    Behavior During Therapy WFL for tasks assessed/performed           Past Medical History:  Diagnosis Date   Hypertension     Past Surgical History:  Procedure Laterality Date   BREAST SURGERY     lumpectomy   CYSTOSCOPY WITH STENT PLACEMENT Left 03/09/2017   Procedure: CYSTOSCOPY, RETROGRADE PYELOGRAM WITH STENT PLACEMENT;  Surgeon: Bjorn Loser, MD;  Location: [facility];  Service: Urology;  Laterality: Left;    There were no vitals filed for this visit.      [facility] PT Assessment - 03/23/20 0001      Assessment   Medical Diagnosis L leg wound    Referring Provider (PT) [employer]                   Wound Therapy - 03/23/20 0001    Subjective PT states that she has no pain today, has not started to wear compression garments yet    Patient and Family Stated Goals to have wound heal    Prior Treatments antibiotic cream, peroxide, bandaid    Pain Scale 0-10    Pain Score 0-No pain    Evaluation and Treatment Procedures Explained to Patient/Family Yes    Evaluation and Treatment Procedures agreed to    Wound Properties Date First Assessed: 03/05/20 Time First Assessed: 1045 Wound Type: Incision - Open Location: Leg Location Orientation: Left;Anterior;Lower Wound Description (Comments):  anterior lower leg Present on Admission: Yes   Dressing Type Compression wrap;Gauze (Comment)   medihoney on gauze   Dressing Changed Changed    Dressing Status Old drainage    Dressing Change Frequency PRN    Site / Wound Assessment Granulation tissue    % Wound base Red or Granulating 100%    % Wound base Yellow/Fibrinous Exudate 0%    Margins Epibole (rolled edges)   between 11 and 1 o'clock   Drainage Amount Scant    Drainage Description Serosanguineous    Treatment Cleansed;Debridement (Selective)    Selective Debridement - Location wound bed and wound edges    Selective Debridement - Tools Used Forceps    Selective Debridement - Tissue Removed epiboled edges, devitalized tissue    Wound Therapy - Clinical Statement Patient wound improving with 100% granulation tissues and reduced depth today. Debrided epibole from 11 to 1 o'clock with forceps which patient tolerated well today. Dressing changed to xeroform today with Vaseline to periwound and 2x2. Continued with profore for compression.     Wound Therapy - Functional Problem List difficulty walking    Factors Delaying/Impairing Wound Healing Vascular compromise    Wound Therapy - Frequency 2X / week    Wound Therapy - Current Recommendations PT  Wound Plan continue with debridement and dressing changes.  Measure weekly.     Dressing  xeroform, 2x2 and vasoline around wound, profore and #5 netting                      PT Short Term Goals - 03/20/20 1215      PT SHORT TERM GOAL #1   Title Patient will have no pain with waking to improve ability to walk around    Time 3    Period Weeks    Status Partially Met    Target Date 03/26/20      PT SHORT TERM GOAL #2   Title Wound will be 100% granulated    Time 3    Period Weeks    Status On-going    Target Date 03/26/20             PT Long Term Goals - 03/20/20 1216      PT LONG TERM GOAL #1   Title Patient will demonstrate ability to wear compression socks  to reduce swelling in bilateral lower extremities.    Time 6    Period Weeks    Status On-going      PT LONG TERM GOAL #2   Title Wound will be completely healed    Time 6    Period Weeks    Status On-going                 Plan - 03/23/20 0951    Clinical Impression Statement See above    Personal Factors and Comorbidities Comorbidity 1    Comorbidities varicose veins, HTN    Stability/Clinical Decision Making Stable/Uncomplicated    Rehab Potential Good    PT Frequency 2x / week    PT Duration 6 weeks    PT Treatment/Interventions ADLs/Self Care Home Management;Manual techniques;Other (comment);Compression bandaging   debridement   PT Next Visit Plan continue with compression and dressing changes    Consulted and Agree with Plan of Care Patient           Patient will benefit from skilled therapeutic intervention in order to improve the following deficits and impairments:  Pain, Increased edema, Difficulty walking, Decreased skin integrity  Visit Diagnosis: Wound of left lower extremity, initial encounter  Difficulty in walking, not elsewhere classified     Problem List Patient Active Problem List   Diagnosis Date Noted   Sepsis ([place]) 03/09/2017   Ureteral stone with hydronephrosis    Hypertension 03/08/2017   Sepsis secondary to UTI ([place]) 03/08/2017   Urinary tract obstruction by kidney stone 03/08/2017   11:16 AM, 03/23/20 Mearl Latin PT, DPT Physical Therapist at [place], [place], [address] Phone: [phone]   Fax:  [phone]  Name: Jody Hernandez MRN: [id number] Date of Birth: Oct 14, 1950

## 2020-03-25 ENCOUNTER — Ambulatory Visit (HOSPITAL_COMMUNITY): Payer: Medicare PPO | Admitting: Physical Therapy

## 2020-03-25 ENCOUNTER — Other Ambulatory Visit: Payer: Self-pay

## 2020-03-25 ENCOUNTER — Encounter (HOSPITAL_COMMUNITY): Payer: Self-pay | Admitting: Physical Therapy

## 2020-03-25 DIAGNOSIS — R262 Difficulty in walking, not elsewhere classified: Secondary | ICD-10-CM | POA: Diagnosis not present

## 2020-03-25 DIAGNOSIS — S81802A Unspecified open wound, left lower leg, initial encounter: Secondary | ICD-10-CM

## 2020-03-25 NOTE — Therapy (Signed)
[facility] [address] [place], [place], [address] Phone: [phone]   Fax:  [phone]  Wound Care Therapy  Patient Details  Name: Jody Hernandez MRN: [id number] Date of Birth: 1950/09/13 Referring Provider (PT): Elfredia Nevins   Encounter Date: 03/25/2020   PT End of Session - 03/25/20 1118    Visit Number 7    Number of Visits 12    Date for PT Re-Evaluation 04/16/20    Authorization Type humana medicare, auth required and submitted. check auth    Progress Note Due on Visit 10    PT Start Time 1045    PT Stop Time 1115    PT Time Calculation (min) 30 min    Activity Tolerance Patient tolerated treatment well    Behavior During Therapy WFL for tasks assessed/performed           Past Medical History:  Diagnosis Date  . Hypertension     Past Surgical History:  Procedure Laterality Date  . BREAST SURGERY     lumpectomy  . CYSTOSCOPY WITH STENT PLACEMENT Left 03/09/2017   Procedure: CYSTOSCOPY, RETROGRADE PYELOGRAM WITH STENT PLACEMENT;  Surgeon: Alfredo Martinez, MD;  Location: [facility];  Service: Urology;  Laterality: Left;    There were no vitals filed for this visit.      [facility] PT Assessment - 03/25/20 0001      Assessment   Medical Diagnosis L leg wound    Referring Provider (PT) Elfredia Nevins                   Wound Therapy - 03/25/20 0001    Subjective Patient reports dressing slide down a bit but otherwise reports no pain.    Patient and Family Stated Goals to have wound heal    Prior Treatments antibiotic cream, peroxide, bandaid    Pain Scale 0-10    Pain Score 0-No pain    Wound Properties Date First Assessed: 03/05/20 Time First Assessed: 1045 Wound Type: Incision - Open Location: Leg Location Orientation: Left;Anterior;Lower Wound Description (Comments): anterior lower leg Present on Admission: Yes   Dressing Type Compression wrap;Impregnated gauze (bismuth)    Dressing Changed Changed     Dressing Status Old drainage    Dressing Change Frequency PRN    Site / Wound Assessment Granulation tissue    % Wound base Red or Granulating 100%    % Wound base Yellow/Fibrinous Exudate 0%    Wound Length (cm) 0.9 cm   was 1.2   Wound Width (cm) 1.1 cm   was 1.2   Wound Depth (cm) 0.1 cm    Wound Volume (cm^3) 0.1 cm^3    Wound Surface Area (cm^2) 0.99 cm^2    Margins Epibole (rolled edges)    Drainage Amount Scant    Drainage Description Serosanguineous    Treatment Cleansed;Debridement (Selective)    Selective Debridement - Location wound bed and wound edges    Selective Debridement - Tools Used Scalpel;Forceps    Selective Debridement - Tissue Removed epiboled edges, devitalized tissue    Wound Therapy - Clinical Statement Patient tolerated debridement well with no reports of pain. Wound continues to approximate and fill in. Continued with xeroform and profore. Debrided epibole edges along 11-1 o'clock of wound and patient tolerated this well. Will continue with current POC.     Wound Therapy - Functional Problem List difficulty walking    Factors Delaying/Impairing Wound Healing Vascular compromise    Wound Therapy - Frequency  2X / week    Wound Therapy - Current Recommendations PT    Wound Plan continue with debridement and dressing changes.  Measure weekly.     Dressing  xeroform, 2x2 and vasoline around wound, profore and #5 netting                      PT Short Term Goals - 03/20/20 1215      PT SHORT TERM GOAL #1   Title Patient will have no pain with waking to improve ability to walk around    Time 3    Period Weeks    Status Partially Met    Target Date 03/26/20      PT SHORT TERM GOAL #2   Title Wound will be 100% granulated    Time 3    Period Weeks    Status On-going    Target Date 03/26/20             PT Long Term Goals - 03/20/20 1216      PT LONG TERM GOAL #1   Title Patient will demonstrate ability to wear compression socks to reduce  swelling in bilateral lower extremities.    Time 6    Period Weeks    Status On-going      PT LONG TERM GOAL #2   Title Wound will be completely healed    Time 6    Period Weeks    Status On-going                 Plan - 03/25/20 1119    Clinical Impression Statement see above    Personal Factors and Comorbidities Comorbidity 1    Comorbidities varicose veins, HTN    Stability/Clinical Decision Making Stable/Uncomplicated    Rehab Potential Good    PT Frequency 2x / week    PT Duration 6 weeks    PT Treatment/Interventions ADLs/Self Care Home Management;Manual techniques;Other (comment);Compression bandaging   debridement   PT Next Visit Plan continue with compression and dressing changes    Consulted and Agree with Plan of Care Patient           Patient will benefit from skilled therapeutic intervention in order to improve the following deficits and impairments:  Pain, Increased edema, Difficulty walking, Decreased skin integrity  Visit Diagnosis: Wound of left lower extremity, initial encounter  Difficulty in walking, not elsewhere classified     Problem List Patient Active Problem List   Diagnosis Date Noted  . Sepsis ([place]) 03/09/2017  . Ureteral stone with hydronephrosis   . Hypertension 03/08/2017  . Sepsis secondary to UTI ([place]) 03/08/2017  . Urinary tract obstruction by kidney stone 03/08/2017    11:23 AM, 03/25/20 Jerene Pitch, DPT Physical Therapy with [facility]  [phone] office  [place] [address] [place], [place], [address] Phone: [phone]   Fax:  [phone]  Name: Jody Hernandez MRN: [id number] Date of Birth: [birth year]

## 2020-03-31 ENCOUNTER — Ambulatory Visit (HOSPITAL_COMMUNITY): Payer: Medicare PPO | Attending: Internal Medicine | Admitting: Physical Therapy

## 2020-03-31 ENCOUNTER — Other Ambulatory Visit: Payer: Self-pay

## 2020-03-31 ENCOUNTER — Encounter (HOSPITAL_COMMUNITY): Payer: Self-pay | Admitting: Physical Therapy

## 2020-03-31 DIAGNOSIS — S81802A Unspecified open wound, left lower leg, initial encounter: Secondary | ICD-10-CM | POA: Diagnosis not present

## 2020-03-31 DIAGNOSIS — R262 Difficulty in walking, not elsewhere classified: Secondary | ICD-10-CM | POA: Diagnosis not present

## 2020-03-31 NOTE — Therapy (Signed)
Jody Hernandez, [place], [address] Phone: [phone]   Fax:  [phone]  Wound Care Therapy  Patient Details  Name: Jody Hernandez MRN: [id number] Date of Birth: 08-Jan-1951 Referring Provider (PT): [employer]   Encounter Date: 03/31/2020   PT End of Session - 03/31/20 1127    Visit Number 8    Number of Visits 12    Date for PT Re-Evaluation 04/16/20    Authorization Type human[employer] medic[employer], auth required and submitted. check auth    Progress Note Due on Visit 10    PT Start Time 1045    PT Stop Time 1120    PT Time Calculation (min) 35 min    Activity Tolerance Patient tolerated treatment well    Behavior During Therapy WFL for tasks assessed/performed           Past Medical History:  Diagnosis Date  . Hypertension     Past Surgical History:  Procedure Laterality Date  . BREAST SURGERY     lumpectomy  . CYSTOSCOPY WITH STENT PLACEMENT Left 03/09/2017   Procedure: CYSTOSCOPY, RETROGRADE PYELOGRAM WITH STENT PLACEMENT;  Surgeon: Bjorn Loser, MD;  Location: [facility];  Service: Urology;  Laterality: Left;    There were no vitals filed for this visit.      [facility] PT Assessment - 03/31/20 0001      Assessment   Medical Diagnosis L leg wound    Referring Provider (PT) [employer]                   Wound Therapy - 03/31/20 0001    Subjective Patient reports dressing felt good first couple days but then it became very tight a few days ago, then got better and then got tight again. States occasional pain in leg and itching throughout lower leg. Reports she did have a whole bag of popcorn 2 nights in a row and her legs historically swell with that.     Patient and Family Stated Goals to have wound heal    Prior Treatments antibiotic cream, peroxide, bandaid    Pain Scale 0-10    Pain Score 1     Pain Type Acute pain    Pain Location Leg    Pain Orientation Left;Anterior    Pain  Descriptors / Indicators Aching    Pain Onset With Activity    Pain Intervention(s) Emotional support    Evaluation and Treatment Procedures Explained to Patient/Family Yes    Evaluation and Treatment Procedures agreed to    Wound Properties Date First Assessed: 03/05/20 Time First Assessed: 1045 Wound Type: Incision - Open Location: Leg Location Orientation: Left;Anterior;Lower Wound Description (Comments): anterior lower leg Present on Admission: Yes   Dressing Type Compression wrap;Impregnated gauze (bismuth)    Dressing Changed Changed    Dressing Status Old drainage    Dressing Change Frequency PRN    Site / Wound Assessment Granulation tissue    % Wound base Red or Granulating 100%    % Wound base Yellow/Fibrinous Exudate 0%    Margins Epibole (rolled edges)    Drainage Amount Scant    Drainage Description Serosanguineous    Treatment Debridement (Selective);Cleansed    Selective Debridement - Location wound bed and wound edges    Selective Debridement - Tools Used Forceps    Selective Debridement - Tissue Removed epiboled edges, devitalized tissue    Wound Therapy - Clinical Statement Dressing slid down and bunched as if  patient had increase then decrease in swelling multiple times since last visit. Educated patient on swelling triggers (noted swelling in both legs and significant swelling superior to dressing. Educated patient in bringing in compression garments next session as she should always wear them (only has knee highs) but explained how this will be better then nothing. Continued redness around wound but no different than previous session. Small spots of irritation around wound likely due to increased compression with swelling.  Dressed with profore compression but patient reported tightness at foot, rewrapped coban layer and able to get finger under dressing, once patient stood up, patient reported no tightness and dressing felt good. Performed edema massage around wound  secondary to swelling. Added extra cotton secondary to significant proximal swelling. Anticipate wound to be healed next session.    Wound Therapy - Functional Problem List difficulty walking    Factors Delaying/Impairing Wound Healing Vascular compromise    Wound Therapy - Frequency 2X / week    Wound Therapy - Current Recommendations PT    Wound Plan continue with debridement and dressing changes.  Measure weekly.     Dressing  xeroform, 2x2 and vasoline around wound, profore and #5 netting                      PT Short Term Goals - 03/20/20 1215      PT SHORT TERM GOAL #1   Title Patient will have no pain with waking to improve ability to walk around    Time 3    Period Weeks    Status Partially Met    Target Date 03/26/20      PT SHORT TERM GOAL #2   Title Wound will be 100% granulated    Time 3    Period Weeks    Status On-going    Target Date 03/26/20             PT Long Term Goals - 03/20/20 1216      PT LONG TERM GOAL #1   Title Patient will demonstrate ability to wear compression socks to reduce swelling in bilateral lower extremities.    Time 6    Period Weeks    Status On-going      PT LONG TERM GOAL #2   Title Wound will be completely healed    Time 6    Period Weeks    Status On-going                 Plan - 03/31/20 1127    Clinical Impression Statement see above    Personal Factors and Comorbidities Comorbidity 1    Comorbidities varicose veins, HTN    Stability/Clinical Decision Making Stable/Uncomplicated    Rehab Potential Good    PT Frequency 2x / week    PT Duration 6 weeks    PT Treatment/Interventions ADLs/Self Care Home Management;Manual techniques;Other (comment);Compression bandaging   debridement   PT Next Visit Plan continue with compression and dressing changes    Consulted and Agree with Plan of Care Patient           Patient will benefit from skilled therapeutic intervention in order to improve the following  deficits and impairments:  Pain, Increased edema, Difficulty walking, Decreased skin integrity  Visit Diagnosis: Wound of left lower extremity, initial encounter  Difficulty in walking, not elsewhere classified     Problem List Patient Active Problem List   Diagnosis Date Noted  . Sepsis ([place]) 03/09/2017  .  Ureteral stone with hydronephrosis   . Hypertension 03/08/2017  . Sepsis secondary to UTI ([place]) 03/08/2017  . Urinary tract obstruction by kidney stone 03/08/2017    11:49 AM, 03/31/20 Jerene Pitch, DPT Physical Therapy with [facility]  [phone] office  [place], [place], [address] Phone: [phone]   Fax:  [phone]  Name: Jody Hernandez MRN: [id number] Date of Birth: [birth year]

## 2020-04-02 ENCOUNTER — Ambulatory Visit (HOSPITAL_COMMUNITY): Payer: Medicare PPO | Admitting: Physical Therapy

## 2020-04-02 ENCOUNTER — Other Ambulatory Visit: Payer: Self-pay

## 2020-04-02 ENCOUNTER — Encounter (HOSPITAL_COMMUNITY): Payer: Self-pay | Admitting: Physical Therapy

## 2020-04-02 DIAGNOSIS — R262 Difficulty in walking, not elsewhere classified: Secondary | ICD-10-CM | POA: Diagnosis not present

## 2020-04-02 DIAGNOSIS — S81802A Unspecified open wound, left lower leg, initial encounter: Secondary | ICD-10-CM | POA: Diagnosis not present

## 2020-04-02 NOTE — Therapy (Signed)
[place], [place], [address] Phone: [phone]   Fax:  [phone]  Wound Care Therapy  Patient Details  Name: Jody Hernandez MRN: [id number] Date of Birth: [birth year] Referring Provider (PT): [employer]   Encounter Date: 04/02/2020   PT End of Session - 04/02/20 1217    Visit Number 9    Number of Visits 12    Date for PT Re-Evaluation 04/16/20    Authorization Type [employer] medic[employer], auth required and submitted. check auth    Progress Note Due on Visit 10    PT Start Time 1130    PT Stop Time 1209    PT Time Calculation (min) 39 min    Activity Tolerance Patient tolerated treatment well    Behavior During Therapy WFL for tasks assessed/performed           Past Medical History:  Diagnosis Date  . Hypertension     Past Surgical History:  Procedure Laterality Date  . BREAST SURGERY     lumpectomy  . CYSTOSCOPY WITH STENT PLACEMENT Left 03/09/2017   Procedure: CYSTOSCOPY, RETROGRADE PYELOGRAM WITH STENT PLACEMENT;  Surgeon: Bjorn Loser, MD;  Location: [facility];  Service: Urology;  Laterality: Left;    There were no vitals filed for this visit.      [facility] PT Assessment - 04/02/20 0001      Assessment   Medical Diagnosis L leg wound    Referring Provider (PT) [employer]                   Wound Therapy - 04/02/20 0001    Subjective Patient reports occasional tightness with dressing but not all the time. Brought in her compression garments and thing to put it on her leg today.     Patient and Family Stated Goals to have wound heal    Prior Treatments antibiotic cream, peroxide, bandaid    Pain Scale 0-10    Pain Score 0-No pain    Evaluation and Treatment Procedures Explained to Patient/Family Yes    Evaluation and Treatment Procedures agreed to    Wound Properties Date First Assessed: 03/05/20 Time First Assessed: 1045 Wound Type: Incision - Open Location: Leg Location  Orientation: Left;Anterior;Lower Wound Description (Comments): anterior lower leg Present on Admission: Yes   Dressing Type Compression wrap;Impregnated gauze (bismuth)    Dressing Changed Changed    Dressing Status Old drainage    Dressing Change Frequency PRN    Site / Wound Assessment Granulation tissue    % Wound base Red or Granulating 100%    Peri-wound Assessment Erythema (blanchable);Maceration    Wound Length (cm) 0.6 cm   was .9   Wound Width (cm) 0.4 cm   was 1.1   Wound Depth (cm) 0.1 cm   was .1   Wound Volume (cm^3) 0.02 cm^3    Wound Surface Area (cm^2) 0.24 cm^2    Margins Epibole (rolled edges)    Drainage Amount Scant    Drainage Description Serosanguineous    Treatment Debridement (Selective);Cleansed    Selective Debridement - Location wound bed and wound edges    Selective Debridement - Tools Used Forceps    Selective Debridement - Tissue Removed epiboled edges, devitalized tissue    Wound Therapy - Clinical Statement Educated pain in importance of wearing compression secondary to swelling bilaterally. Trialed getting compression garment on right lower extremity with device bought at store but compression was too much for the device.  Able to get on right leg but patient unable to get on herself. Educated in getting butler for easy donning compression garments. Wound approximating well, epibole edges continued to noted along 11-2 o'clock. Opened edges up but this epibole is deep and looks like wound may heal with grove in skin. Decreased compression to profore lite secondary to complaints of occasional tightness and redness noted around wound (which decreased after dressing was removed).    Wound Therapy - Functional Problem List difficulty walking    Factors Delaying/Impairing Wound Healing Vascular compromise    Wound Therapy - Frequency 2X / week    Wound Therapy - Current Recommendations PT    Wound Plan continue with debridement and dressing changes.  Measure weekly.      Dressing  xeroform, 2x2 and vasoline around wound, profore lite and #5 netting                    PT Education - 04/02/20 1242    Education Details on use of compression garments, butler to improve ease of getting cmpression garments on    Person(s) Educated Patient    Methods Explanation    Comprehension Verbalized understanding            PT Short Term Goals - 03/20/20 1215      PT SHORT TERM GOAL #1   Title Patient will have no pain with waking to improve ability to walk around    Time 3    Period Weeks    Status Partially Met    Target Date 03/26/20      PT SHORT TERM GOAL #2   Title Wound will be 100% granulated    Time 3    Period Weeks    Status On-going    Target Date 03/26/20             PT Long Term Goals - 03/20/20 1216      PT LONG TERM GOAL #1   Title Patient will demonstrate ability to wear compression socks to reduce swelling in bilateral lower extremities.    Time 6    Period Weeks    Status On-going      PT LONG TERM GOAL #2   Title Wound will be completely healed    Time 6    Period Weeks    Status On-going                 Plan - 04/02/20 1217    Clinical Impression Statement see above    Personal Factors and Comorbidities Comorbidity 1    Comorbidities varicose veins, HTN    Stability/Clinical Decision Making Stable/Uncomplicated    Rehab Potential Good    PT Frequency 2x / week    PT Duration 6 weeks    PT Treatment/Interventions ADLs/Self Care Home Management;Manual techniques;Other (comment);Compression bandaging   debridement   PT Next Visit Plan continue with compression and dressing changes    Consulted and Agree with Plan of Care Patient           Patient will benefit from skilled therapeutic intervention in order to improve the following deficits and impairments:  Pain, Increased edema, Difficulty walking, Decreased skin integrity  Visit Diagnosis: Wound of left lower extremity, initial  encounter  Difficulty in walking, not elsewhere classified     Problem List Patient Active Problem List   Diagnosis Date Noted  . Sepsis ([place]) 03/09/2017  . Ureteral stone with hydronephrosis   . Hypertension 03/08/2017  .  Sepsis secondary to UTI ([place]) 03/08/2017  . Urinary tract obstruction by kidney stone 03/08/2017    12:49 PM, 04/02/20 Jerene Pitch, DPT Physical Therapy with [facility]  [phone] office  [facility] [address] [place], [place], [address] Phone: [phone]   Fax:  [phone]  Name: Jody Hernandez MRN: [id number] Date of Birth: [birth year]

## 2020-04-07 ENCOUNTER — Other Ambulatory Visit: Payer: Self-pay

## 2020-04-07 ENCOUNTER — Encounter (HOSPITAL_COMMUNITY): Payer: Self-pay | Admitting: Physical Therapy

## 2020-04-07 ENCOUNTER — Ambulatory Visit (HOSPITAL_COMMUNITY): Payer: Medicare PPO | Admitting: Physical Therapy

## 2020-04-07 DIAGNOSIS — S81802A Unspecified open wound, left lower leg, initial encounter: Secondary | ICD-10-CM

## 2020-04-07 DIAGNOSIS — R262 Difficulty in walking, not elsewhere classified: Secondary | ICD-10-CM

## 2020-04-07 NOTE — Therapy (Signed)
[place], [place], [address] Phone: [phone]   Fax:  [phone]  Wound Care Therapy and Progress Note  Patient Details  Name: Jody Hernandez MRN: [id number] Date of Birth: May 31, 1950 Referring Provider (PT): [employer]   Progress Note Reporting Period 03/05/20 to 04/07/20  See note below for Objective Data and Assessment of Progress/Goals.       Encounter Date: 04/07/2020   PT End of Session - 04/07/20 0916    Visit Number 10    Number of Visits 12    Date for PT Re-Evaluation 04/16/20    Authorization Type [employer] medicare, auth required and submitted. check auth    Progress Note Due on Visit 10    PT Start Time 0915    PT Stop Time 0945    PT Time Calculation (min) 30 min    Activity Tolerance Patient tolerated treatment well    Behavior During Therapy [facility] for tasks assessed/performed           Past Medical History:  Diagnosis Date  . Hypertension     Past Surgical History:  Procedure Laterality Date  . BREAST SURGERY     lumpectomy  . CYSTOSCOPY WITH STENT PLACEMENT Left 03/09/2017   Procedure: CYSTOSCOPY, RETROGRADE PYELOGRAM WITH STENT PLACEMENT;  Surgeon: Bjorn Loser, MD;  Location: [facility];  Service: Urology;  Laterality: Left;    There were no vitals filed for this visit.      [facility] PT Assessment - 04/07/20 0001      Assessment   Medical Diagnosis L leg wound    Referring Provider (PT) [employer]                   Wound Therapy - 04/07/20 0001    Subjective Patietn reports that it felt tight on Sunday so she took the outer layer off. States that yesterday she stepping in a puddle and the dressing got wet so she took everything off and put a bandaid on the wound. States she doesn't have any pain and thinks it must be healed.     Patient and Family Stated Goals to have wound heal    Prior Treatments antibiotic cream, peroxide, bandaid    Pain Scale 0-10     Pain Score 0-No pain    Evaluation and Treatment Procedures Explained to Patient/Family Yes    Evaluation and Treatment Procedures agreed to    Wound Properties Date First Assessed: 03/05/20 Time First Assessed: 1045 Wound Type: Incision - Open Location: Leg Location Orientation: Left;Anterior;Lower Wound Description (Comments): anterior lower leg Present on Admission: Yes   Dressing Type --   bandaid   Dressing Changed Changed    Dressing Change Frequency PRN    Site / Wound Assessment Granulation tissue    % Wound base Red or Granulating 100%    % Wound base Yellow/Fibrinous Exudate 0%    Peri-wound Assessment Erythema (blanchable);Edema    Wound Length (cm) 1.1 cm   was .6   Wound Width (cm) 1.2 cm   was .4   Wound Depth (cm) 0.1 cm   was .1   Wound Volume (cm^3) 0.13 cm^3    Wound Surface Area (cm^2) 1.32 cm^2    Margins Unattached edges (unapproximated)    Drainage Amount None    Treatment Debridement (Selective);Cleansed    Selective Debridement - Location wound bed and wound edges    Selective Debridement - Tools Used Forceps;Scalpel  Selective Debridement - Tissue Removed epiboled edges, devitalized tissue    Wound Therapy - Clinical Statement Patient for progress note today. Overall wound is improved in size and granulation tissue compared to initial session. However, patient removed dressings a few days ago and leg edema increased and wound increased in size. Educated patient again on what to do if she needs to take dressing off. Patient to get butler and compression garments today. Discussed using compression garment all the time and to put it on if she has to remove current dressing. Answered all questions and educated patient on skin care and use of compression garments daily. Continued with xeroform, returned to profore as profore lite slid down and that is what made the garment too uncomfortable. Applied very light compression with profore and went all the way up to just below  knee secondary to dressing sliding down. Continued redness around wound but this has not changed since initial session. Anticipate wound to be almost healed next session if patient can tolerate dressing. Patient has met all short term goals and no long term goals at this time.     Wound Therapy - Functional Problem List difficulty walking    Factors Delaying/Impairing Wound Healing Vascular compromise    Wound Therapy - Frequency 2X / week    Wound Therapy - Current Recommendations PT    Wound Plan continue with debridement and dressing changes.  Measure weekly.     Dressing  xeroform, 2x2 and vasoline around wound, profore and #5 netting                    PT Education - 04/07/20 0956    Education Details educated patient on use of compression garments, skin care and how wound occur.    Person(s) Educated Patient    Methods Explanation    Comprehension Verbalized understanding            PT Short Term Goals - 04/07/20 0956      PT SHORT TERM GOAL #1   Title Patient will have no pain with waking to improve ability to walk around    Time 3    Period Weeks    Status Achieved    Target Date 03/26/20      PT SHORT TERM GOAL #2   Title Wound will be 100% granulated    Time 3    Period Weeks    Status Achieved    Target Date 03/26/20             PT Long Term Goals - 03/20/20 1216      PT LONG TERM GOAL #1   Title Patient will demonstrate ability to wear compression socks to reduce swelling in bilateral lower extremities.    Time 6    Period Weeks    Status On-going      PT LONG TERM GOAL #2   Title Wound will be completely healed    Time 6    Period Weeks    Status On-going                 Plan - 04/07/20 0916    Clinical Impression Statement see above    Personal Factors and Comorbidities Comorbidity 1    Comorbidities varicose veins, HTN    Stability/Clinical Decision Making Stable/Uncomplicated    Rehab Potential Good    PT Frequency 2x /  week    PT Duration 6 weeks    PT Treatment/Interventions ADLs/Self Care Home Management;Manual  techniques;Other (comment);Compression bandaging   debridement   PT Next Visit Plan continue with compression and dressing changes, educate and practice donning compression garments with butler - patient to bring in her devices and compression garments.    Consulted and Agree with Plan of Care Patient           Patient will benefit from skilled therapeutic intervention in order to improve the following deficits and impairments:  Pain, Increased edema, Difficulty walking, Decreased skin integrity  Visit Diagnosis: Wound of left lower extremity, initial encounter  Difficulty in walking, not elsewhere classified     Problem List Patient Active Problem List   Diagnosis Date Noted  . Sepsis ([place]) 03/09/2017  . Ureteral stone with hydronephrosis   . Hypertension 03/08/2017  . Sepsis secondary to UTI ([place]) 03/08/2017  . Urinary tract obstruction by kidney stone 03/08/2017    10:00 AM, 04/07/20 Jerene Pitch, DPT Physical Therapy with [facility]  [phone] office  [place] [address] [place], [place], [address] Phone: [phone]   Fax:  [phone]  Name: MITA VALLO MRN: [id number] Date of Birth: 10-24-1950

## 2020-04-09 ENCOUNTER — Other Ambulatory Visit: Payer: Self-pay

## 2020-04-09 ENCOUNTER — Encounter (HOSPITAL_COMMUNITY): Payer: Self-pay | Admitting: Physical Therapy

## 2020-04-09 ENCOUNTER — Ambulatory Visit (HOSPITAL_COMMUNITY): Payer: Medicare PPO | Admitting: Physical Therapy

## 2020-04-09 DIAGNOSIS — R262 Difficulty in walking, not elsewhere classified: Secondary | ICD-10-CM

## 2020-04-09 DIAGNOSIS — S81802A Unspecified open wound, left lower leg, initial encounter: Secondary | ICD-10-CM | POA: Diagnosis not present

## 2020-04-09 NOTE — Therapy (Signed)
[facility] [address] [place], [place], [address] Phone: [phone]   Fax:  [phone]  Wound Care Therapy  Patient Details  Name: FINESSE FIELDER MRN: [id number] Date of Birth: Jun 11, 1950 Referring Provider (PT): Elfredia Nevins   Encounter Date: 04/09/2020   PT End of Session - 04/09/20 1117    Visit Number 11    Number of Visits 12    Date for PT Re-Evaluation 04/16/20    Authorization Type humana medicare, auth required and submitted. check auth    Progress Note Due on Visit 20    PT Start Time 1045    PT Stop Time 1115    PT Time Calculation (min) 30 min    Activity Tolerance Patient tolerated treatment well    Behavior During Therapy WFL for tasks assessed/performed           Past Medical History:  Diagnosis Date   Hypertension     Past Surgical History:  Procedure Laterality Date   BREAST SURGERY     lumpectomy   CYSTOSCOPY WITH STENT PLACEMENT Left 03/09/2017   Procedure: CYSTOSCOPY, RETROGRADE PYELOGRAM WITH STENT PLACEMENT;  Surgeon: Alfredo Martinez, MD;  Location: [facility];  Service: Urology;  Laterality: Left;    There were no vitals filed for this visit.      [facility] PT Assessment - 04/09/20 0001      Assessment   Medical Diagnosis L leg wound    Referring Provider (PT) Elfredia Nevins                   Wound Therapy - 04/09/20 0001    Subjective Patient concerned she ordered the wrong size because [facility] said she would be a small/medium and she is a large. States the measurements were wrong on her thigh. States bandage felt good and able to leave in place since last session. Garments have not arrived yet, will arrive tomorrow.    Patient and Family Stated Goals to have wound heal    Prior Treatments antibiotic cream, peroxide, bandaid    Pain Scale 0-10    Pain Score 0-No pain    Evaluation and Treatment Procedures Explained to Patient/Family Yes    Evaluation and Treatment  Procedures agreed to    Wound Properties Date First Assessed: 03/05/20 Time First Assessed: 1045 Wound Type: Incision - Open Location: Leg Location Orientation: Left;Anterior;Lower Wound Description (Comments): anterior lower leg Present on Admission: Yes   Dressing Type Compression wrap;Impregnated gauze (bismuth)    Dressing Changed Changed    Dressing Change Frequency PRN    Site / Wound Assessment Granulation tissue    % Wound base Red or Granulating 100%    % Wound base Yellow/Fibrinous Exudate 0%    Peri-wound Assessment Erythema (blanchable)    Wound Length (cm) 0.4 cm   was 1.1   Wound Width (cm) 0.4 cm   was 1.2   Wound Depth (cm) 0 cm   was .1   Wound Volume (cm^3) 0 cm^3    Wound Surface Area (cm^2) 0.16 cm^2    Margins Unattached edges (unapproximated)    Drainage Amount None    Treatment Cleansed    Selective Debridement - Location --    Selective Debridement - Tools Used --    Selective Debridement - Tissue Removed --    Wound Therapy - Clinical Statement Wound healing very well, no debridement on this date. Small opening still present in wound. If garments had arrived would  have discharged patient to self care of wound. However, compression garments have not arrived yet and skin over recent wound is very fragile. Since wound increased in size with one day without compression, continuing therapy to last scheduled appointment to allow wound to completely heal and new skin to strengthen prior to discharge. Reassured patient about measurements and instructed patient to bring in next session to practice donning and doffing garments. Continued with xeroform and profore. Anticipate discharge next session pending wound presentation.     Wound Therapy - Functional Problem List difficulty walking    Factors Delaying/Impairing Wound Healing Vascular compromise    Wound Therapy - Frequency 2X / week    Wound Therapy - Current Recommendations PT    Wound Plan continue with debridement and  dressing changes.  Measure weekly.     Dressing  xeroform, 2x2 and vasoline around wound, profore and #5 netting            [facility] Adult PT Treatment/Exercise - 04/09/20 0001      Manual Therapy   Manual Therapy Edema management    Manual therapy comments independent of all other interventions    Edema Management edema massage to left LE to reduce swelling.                   PT Education - 04/09/20 1120    Education Details Educated patient how measurements are correct - remeasured in session. To bring in garments next session to determine if appropriate size. In skin care post therapy and importance of wearing garments at all times during the day.    Person(s) Educated Patient    Methods Explanation    Comprehension Verbalized understanding            PT Short Term Goals - 04/07/20 0956      PT SHORT TERM GOAL #1   Title Patient will have no pain with waking to improve ability to walk around    Time 3    Period Weeks    Status Achieved    Target Date 03/26/20      PT SHORT TERM GOAL #2   Title Wound will be 100% granulated    Time 3    Period Weeks    Status Achieved    Target Date 03/26/20             PT Long Term Goals - 03/20/20 1216      PT LONG TERM GOAL #1   Title Patient will demonstrate ability to wear compression socks to reduce swelling in bilateral lower extremities.    Time 6    Period Weeks    Status On-going      PT LONG TERM GOAL #2   Title Wound will be completely healed    Time 6    Period Weeks    Status On-going                 Plan - 04/09/20 1117    Clinical Impression Statement see above    Personal Factors and Comorbidities Comorbidity 1    Comorbidities varicose veins, HTN    Stability/Clinical Decision Making Stable/Uncomplicated    Rehab Potential Good    PT Frequency 2x / week    PT Duration 6 weeks    PT Treatment/Interventions ADLs/Self Care Home Management;Manual techniques;Other (comment);Compression  bandaging   debridement   PT Next Visit Plan continue with compression and dressing changes, educate and practice donning compression garments with butler - patient  to bring in her devices and compression garments.    Consulted and Agree with Plan of Care Patient           Patient will benefit from skilled therapeutic intervention in order to improve the following deficits and impairments:  Pain, Increased edema, Difficulty walking, Decreased skin integrity  Visit Diagnosis: Wound of left lower extremity, initial encounter  Difficulty in walking, not elsewhere classified     Problem List Patient Active Problem List   Diagnosis Date Noted   Sepsis (HCC) 03/09/2017   Ureteral stone with hydronephrosis    Hypertension 03/08/2017   Sepsis secondary to UTI (HCC) 03/08/2017   Urinary tract obstruction by kidney stone 03/08/2017    11:26 AM, 04/09/20 Tereasa Coop, DPT Physical Therapy with [facility]  [phone] office  [facility] [address] [place], [place], [address] Phone: [phone]   Fax:  [phone]  Name: GAZELLE TOWE MRN: [id number] Date of Birth: [birth year]

## 2020-04-14 ENCOUNTER — Encounter (HOSPITAL_COMMUNITY): Payer: Self-pay | Admitting: Physical Therapy

## 2020-04-14 ENCOUNTER — Other Ambulatory Visit: Payer: Self-pay

## 2020-04-14 ENCOUNTER — Ambulatory Visit (HOSPITAL_COMMUNITY): Payer: Medicare PPO | Admitting: Physical Therapy

## 2020-04-14 DIAGNOSIS — S81802A Unspecified open wound, left lower leg, initial encounter: Secondary | ICD-10-CM

## 2020-04-14 DIAGNOSIS — R262 Difficulty in walking, not elsewhere classified: Secondary | ICD-10-CM

## 2020-04-14 NOTE — Therapy (Signed)
[place], [place], [address] Phone: [phone]   Fax:  [phone]  Wound Care Therapy and Discharge Note  Patient Details  Name: Jody Hernandez MRN: [id number] Date of Birth: 1950-08-12 Referring Provider (PT): [employer]   PHYSICAL THERAPY DISCHARGE SUMMARY  Visits from Start of Care: 12  Current functional level related to goals / functional outcomes: All goals met   Remaining deficits: None    Education / Equipment: See below  Plan: Patient agrees to discharge.  Patient goals were met. Patient is being discharged due to meeting the stated rehab goals.  ?????       Encounter Date: 04/14/2020   PT End of Session - 04/14/20 1053    Visit Number 12    Number of Visits 12    Date for PT Re-Evaluation 04/16/20    Authorization Type humana medicare, auth required and submitted. check auth    Progress Note Due on Visit 20    PT Start Time 1015    PT Stop Time 1045    PT Time Calculation (min) 30 min    Activity Tolerance Patient tolerated treatment well    Behavior During Therapy WFL for tasks assessed/performed           Past Medical History:  Diagnosis Date  . Hypertension     Past Surgical History:  Procedure Laterality Date  . BREAST SURGERY     lumpectomy  . CYSTOSCOPY WITH STENT PLACEMENT Left 03/09/2017   Procedure: CYSTOSCOPY, RETROGRADE PYELOGRAM WITH STENT PLACEMENT;  Surgeon: Bjorn Loser, MD;  Location: [facility];  Service: Urology;  Laterality: Left;    There were no vitals filed for this visit.      [facility] PT Assessment - 04/14/20 0001      Assessment   Medical Diagnosis L leg wound    Referring Provider (PT) [employer]                   Wound Therapy - 04/14/20 0001    Subjective Reports no pain or discomfort in her legs. Brought compression garments and butler today    Patient and Family Stated Goals to have wound heal    Prior Treatments  antibiotic cream, peroxide, bandaid    Pain Scale 0-10    Pain Score 0-No pain    Evaluation and Treatment Procedures Explained to Patient/Family Yes    Evaluation and Treatment Procedures agreed to    Wound Properties Date First Assessed: 03/05/20 Time First Assessed: 1045 Wound Type: Incision - Open Location: Leg Location Orientation: Left;Anterior;Lower Wound Description (Comments): anterior lower leg Present on Admission: Yes Final Assessment Date: 04/14/20 Final Assessment Time: 1020   Wound Therapy - Clinical Statement Patient wound completely healed. Session focused on education and donning compression garments. Melina Copa is too small for patient to easily use, advised patient to get longer handle butler for improved use at home and return other butler. Patient to discharge from PT to HEP at this time. All goals met.     Wound Therapy - Functional Problem List difficulty walking    Factors Delaying/Impairing Wound Healing Vascular compromise    Wound Therapy - Frequency 2X / week    Wound Therapy - Current Recommendations PT    Wound Plan pt to DC from PT to self care    Dressing  --                   PT Education -  04/14/20 1052    Education Details on importance of compression, use of compression garments. Different butler sizes. Donning and doffing compression garments.    Person(s) Educated Patient    Methods Explanation    Comprehension Verbalized understanding            PT Short Term Goals - 04/07/20 0956      PT SHORT TERM GOAL #1   Title Patient will have no pain with waking to improve ability to walk around    Time 3    Period Weeks    Status Achieved    Target Date 03/26/20      PT SHORT TERM GOAL #2   Title Wound will be 100% granulated    Time 3    Period Weeks    Status Achieved    Target Date 03/26/20             PT Long Term Goals - 04/14/20 1052      PT LONG TERM GOAL #1   Title Patient will demonstrate ability to wear compression socks  to reduce swelling in bilateral lower extremities.    Time 6    Period Weeks    Status Achieved      PT LONG TERM GOAL #2   Title Wound will be completely healed    Time 6    Period Weeks    Status Achieved                 Plan - 04/14/20 1053    Clinical Impression Statement see above    Personal Factors and Comorbidities Comorbidity 1    Comorbidities varicose veins, HTN    Stability/Clinical Decision Making Stable/Uncomplicated    Rehab Potential Good    PT Frequency 2x / week    PT Duration 6 weeks    PT Treatment/Interventions ADLs/Self Care Home Management;Manual techniques;Other (comment);Compression bandaging   debridement   PT Next Visit Plan pt to DC to self care at this time    Consulted and Agree with Plan of Care Patient           Patient will benefit from skilled therapeutic intervention in order to improve the following deficits and impairments:  Pain, Increased edema, Difficulty walking, Decreased skin integrity  Visit Diagnosis: Difficulty in walking, not elsewhere classified  Wound of left lower extremity, initial encounter     Problem List Patient Active Problem List   Diagnosis Date Noted  . Sepsis ([place]) 03/09/2017  . Ureteral stone with hydronephrosis   . Hypertension 03/08/2017  . Sepsis secondary to UTI ([place]) 03/08/2017  . Urinary tract obstruction by kidney stone 03/08/2017    10:54 AM, 04/14/20 Jerene Pitch, DPT Physical Therapy with [facility]  [phone] office  [place] [address] [place], [place], [address] Phone: [phone]   Fax:  [phone]  Name: Jody Hernandez MRN: [id number] Date of Birth: August 20, 1950

## 2020-04-16 ENCOUNTER — Ambulatory Visit (HOSPITAL_COMMUNITY): Payer: Medicare PPO | Admitting: Physical Therapy

## 2020-05-07 NOTE — Addendum Note (Signed)
Addended by: Tereasa Coop R on: 05/07/2020 10:27 AM   Modules accepted: Orders

## 2021-04-19 DIAGNOSIS — N3281 Overactive bladder: Secondary | ICD-10-CM | POA: Diagnosis not present

## 2021-04-19 DIAGNOSIS — E6609 Other obesity due to excess calories: Secondary | ICD-10-CM | POA: Diagnosis not present

## 2021-04-19 DIAGNOSIS — Z6833 Body mass index (BMI) 33.0-33.9, adult: Secondary | ICD-10-CM | POA: Diagnosis not present

## 2021-04-19 DIAGNOSIS — I10 Essential (primary) hypertension: Secondary | ICD-10-CM | POA: Diagnosis not present

## 2021-06-01 IMAGING — MG DIGITAL SCREENING BILAT W/ TOMO W/ CAD
6 of 10 series · 6 of 30 positions shown · non-contrast
Comparison: Previous exam(s).

CLINICAL DATA: Screening.

EXAM:
DIGITAL SCREENING BILATERAL MAMMOGRAM WITH TOMO AND CAD

[L MLO synth-2D]
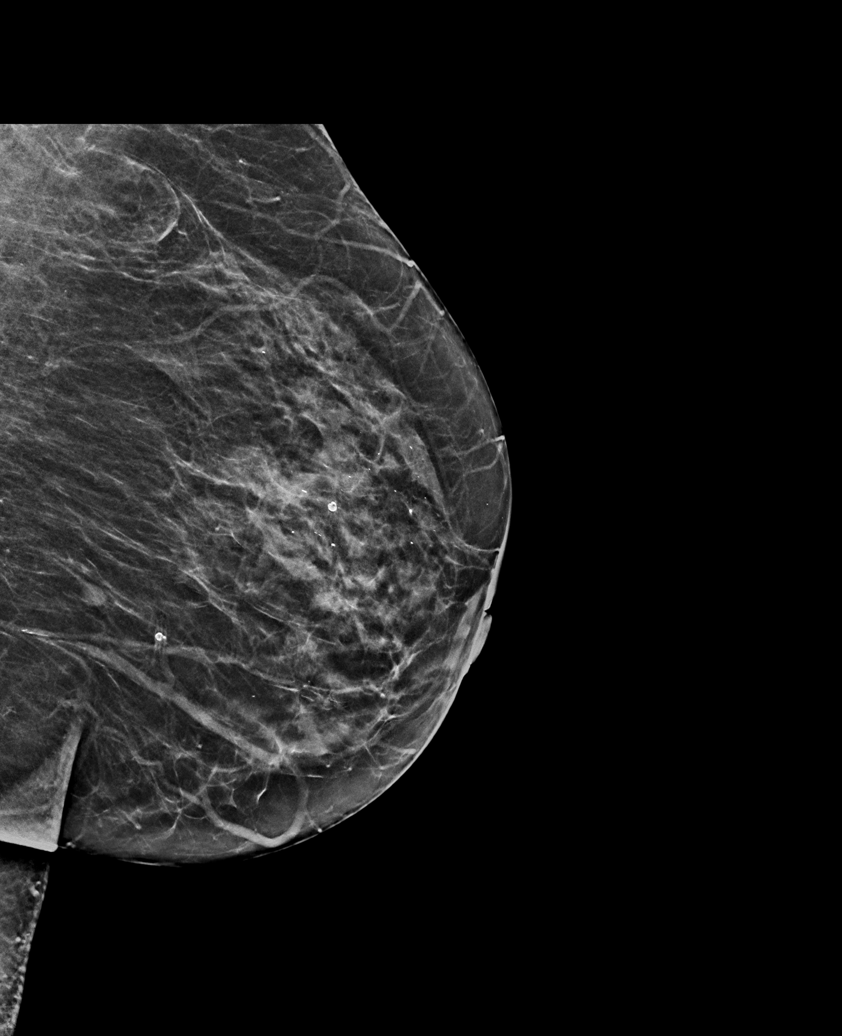

[L CC synth-2D]
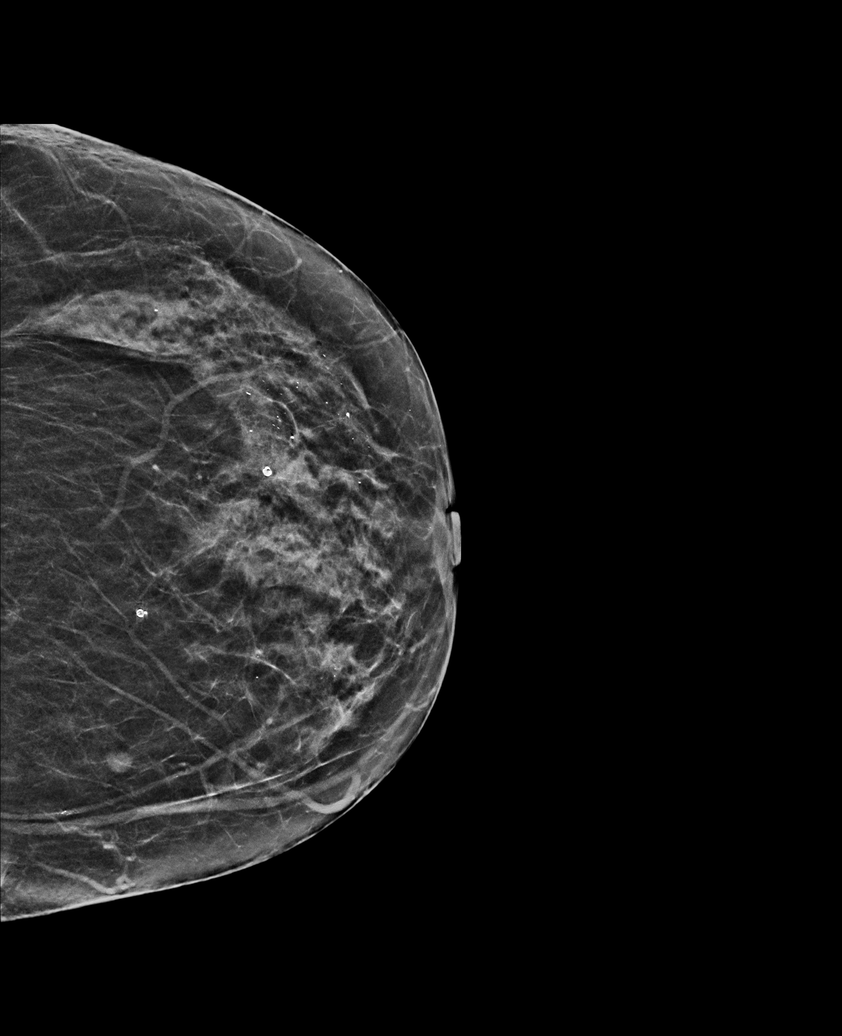

[R MLO synth-2D]
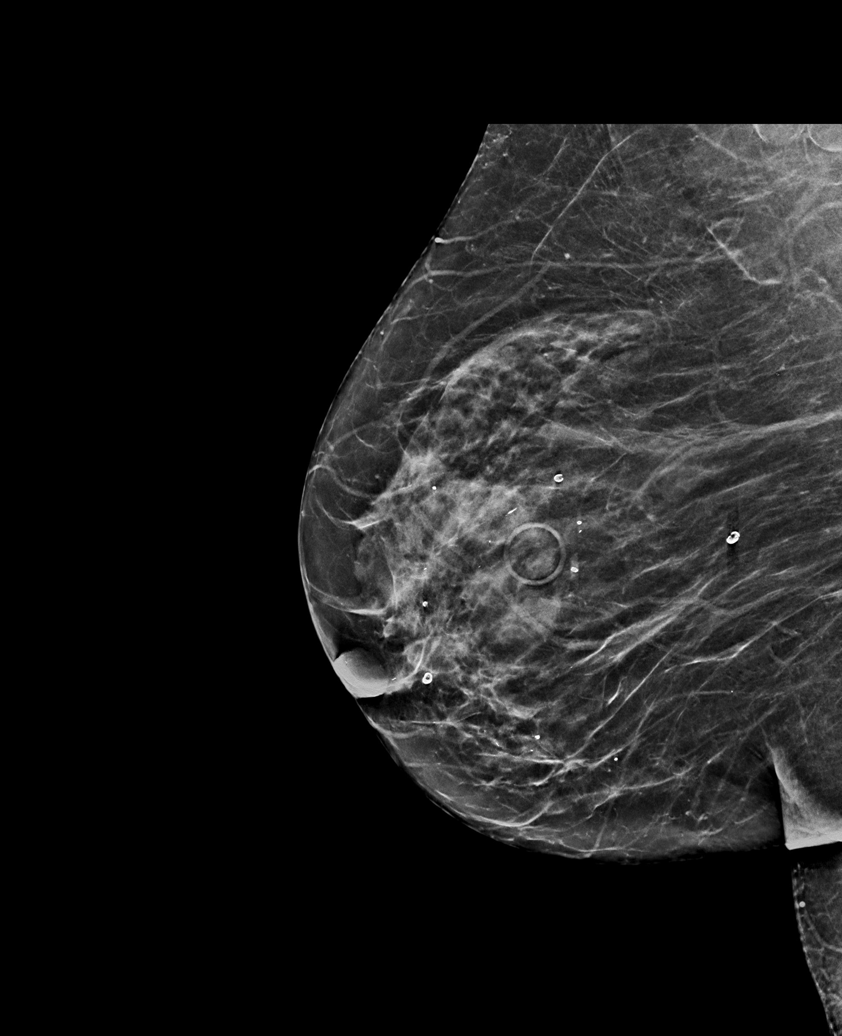

[R CC synth-2D (1 of 2)]
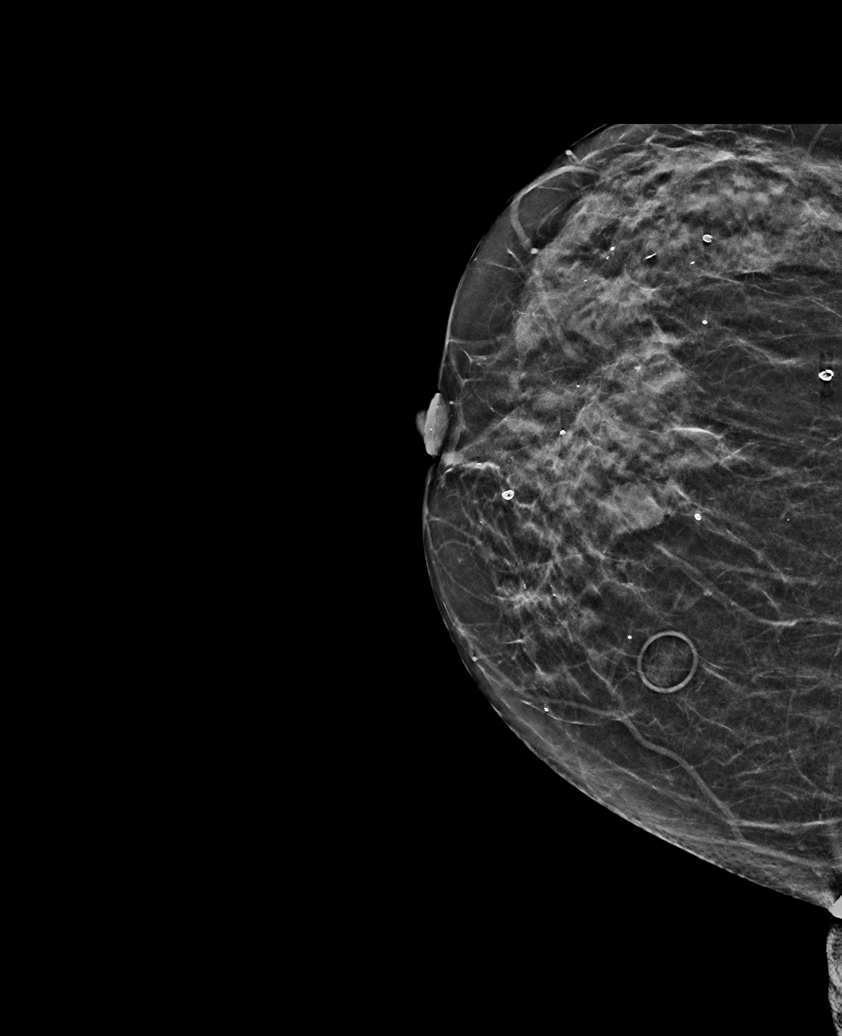

[R CC synth-2D (2 of 2)]
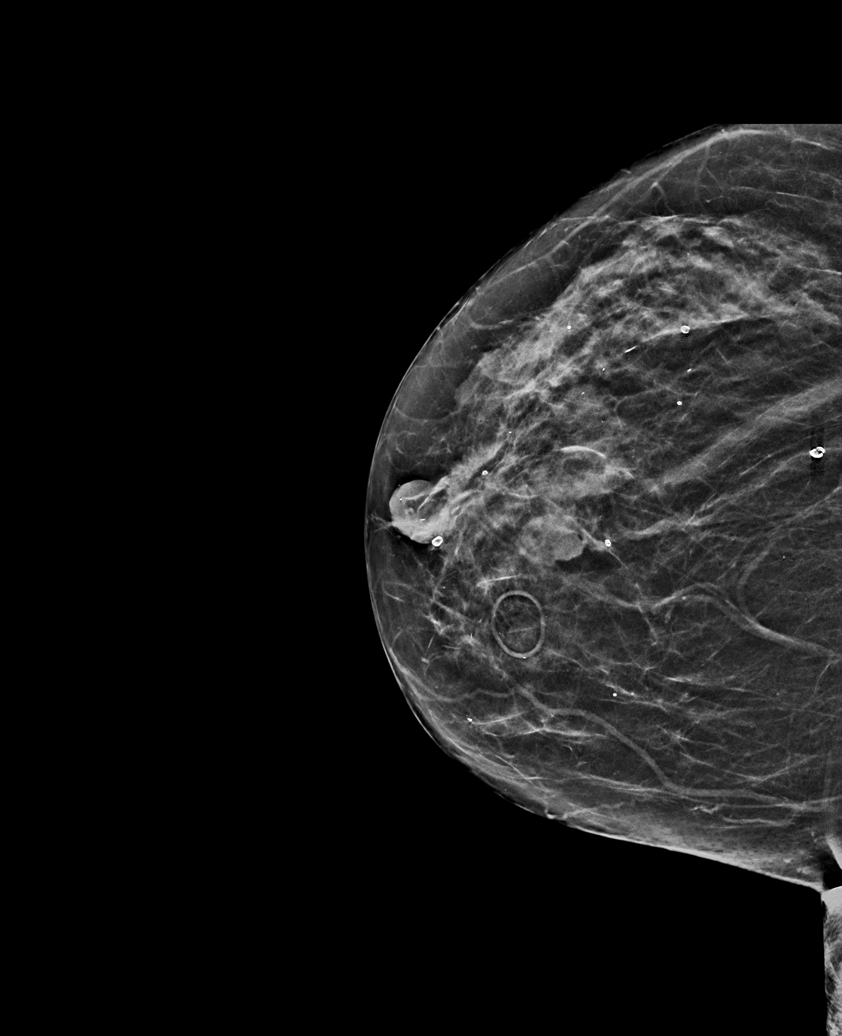

[R CC tomo · tomo slice 26/51.0]
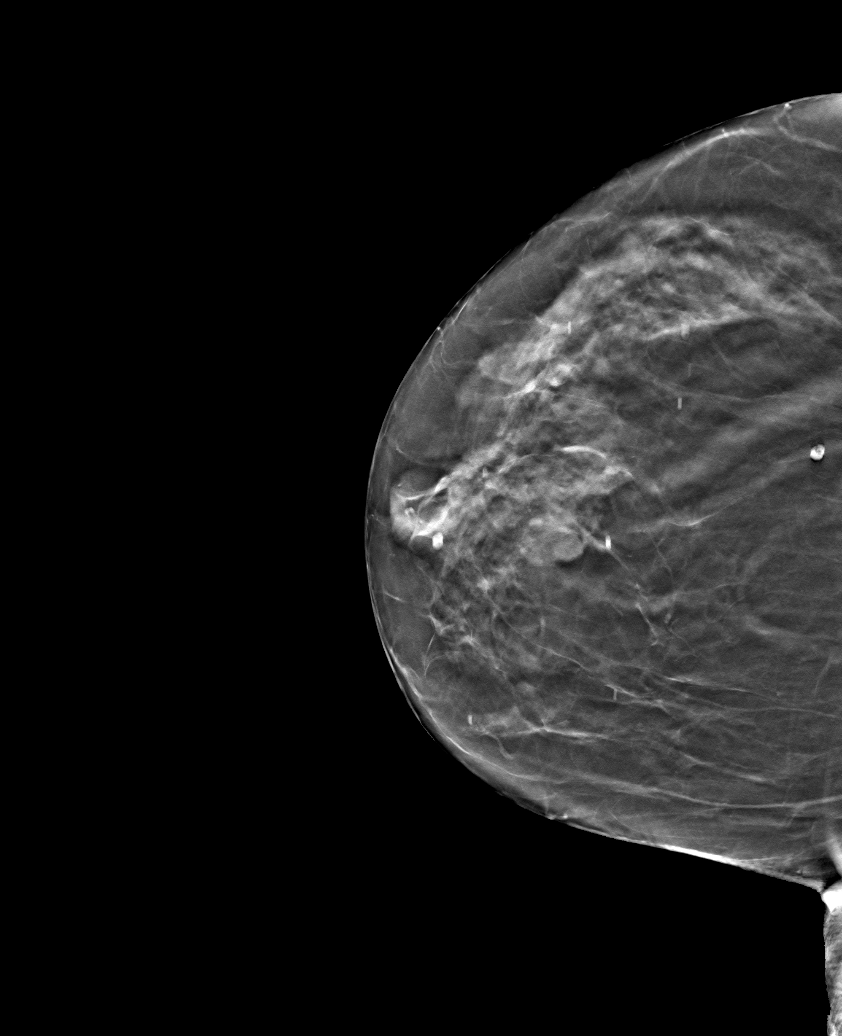

[6 of 30 positions shown; findings below may reference images not displayed]

ACR Breast Density Category c: The breast tissue is heterogeneously
dense, which may obscure small masses.
FINDINGS: There are no findings suspicious for malignancy. Images were
processed with CAD.
IMPRESSION: No mammographic evidence of malignancy. A result letter of this
screening mammogram will be mailed directly to the patient.

RECOMMENDATION:
Screening mammogram in one year. (Code:[id number]-U-LHB)

BI-RADS CATEGORY  1: Negative.

## 2021-08-16 ENCOUNTER — Ambulatory Visit
Admission: EM | Admit: 2021-08-16 | Discharge: 2021-08-16 | Disposition: A | Discharge status: Home / Self Care | Payer: Medicare PPO | Attending: Student | Admitting: Student

## 2021-08-16 ENCOUNTER — Other Ambulatory Visit: Payer: Self-pay

## 2021-08-16 DIAGNOSIS — Z112 Encounter for screening for other bacterial diseases: Secondary | ICD-10-CM | POA: Diagnosis not present

## 2021-08-16 DIAGNOSIS — H109 Unspecified conjunctivitis: Secondary | ICD-10-CM | POA: Diagnosis not present

## 2021-08-16 DIAGNOSIS — Z20818 Contact with and (suspected) exposure to other bacterial communicable diseases: Secondary | ICD-10-CM | POA: Diagnosis not present

## 2021-08-16 LAB — POCT RAPID STREP A (OFFICE): Rapid Strep A Screen: NEGATIVE

## 2021-08-16 MED ORDER — POLYMYXIN B-TRIMETHOPRIM 10000-0.1 UNIT/ML-% OP SOLN: 1.0000 [drp] | OPHTHALMIC | 0 refills | Status: DC

## 2021-08-16 NOTE — ED Provider Notes (Signed)
?[facility] ? ? ? ?CSN: [id number] ?Arrival date & time: 08/16/21  1449 ? ? ?  ? ?History   ?Chief Complaint ?Chief Complaint  ?Patient presents with  ? Eye Drainage  ?  Both eyes are red, swollen and draining  ? ? ?HPI ?Jody Hernandez is a 71 y.o. female presenting with concern for bilateral pinkeye following exposure to this at home. States multiple family members have pinkeye. They are on antibiotic drops and improving now. she notes redness, irritation, watering, crusting in the morning. Denies vision changes, flashes of light or floaters in field of vision, curtains in field of vision, foreign body sensation, photophobia. Also with ST and PND but no cough, no fevers/chills. ? ?HPI ? ?Past Medical History:  ?Diagnosis Date  ? Hypertension   ? ? ?Patient Active Problem List  ? Diagnosis Date Noted  ? Sepsis (HCC) 03/09/2017  ? Ureteral stone with hydronephrosis   ? Hypertension 03/08/2017  ? Sepsis secondary to UTI (HCC) 03/08/2017  ? Urinary tract obstruction by kidney stone 03/08/2017  ? ? ?Past Surgical History:  ?Procedure Laterality Date  ? BREAST SURGERY    ? lumpectomy  ? CYSTOSCOPY WITH STENT PLACEMENT Left 03/09/2017  ? Procedure: CYSTOSCOPY, RETROGRADE PYELOGRAM WITH STENT PLACEMENT;  Surgeon: Alfredo Martinez, MD;  Location: [facility];  Service: Urology;  Laterality: Left;  ? ? ?OB History   ?No obstetric history on file. ?  ? ? ? ?Home Medications   ? ?Prior to Admission medications   ?Medication Sig Start Date End Date Taking? Authorizing Provider  ?trimethoprim-polymyxin b (POLYTRIM) ophthalmic solution Place 1 drop into both eyes every 4 (four) hours. 1 drop into both eyes every 4 hours while awake x7 days. 08/16/21  Yes Rhys Martini, PA-C  ?lisinopril (PRINIVIL,ZESTRIL) 20 MG tablet Take 20 mg by mouth daily.    [provider]  ? ? ?Family History ?Family History  ?Problem Relation Age of Onset  ? Kidney cancer Neg Hx   ? ? ?Social History ?Social History  ? ?Tobacco Use  ?  Smoking status: Never  ? Smokeless tobacco: Never  ?Vaping Use  ? Vaping Use: Never used  ?Substance Use Topics  ? Alcohol use: No  ? Drug use: No  ? ? ? ?Allergies   ?Patient has no known allergies. ? ? ?Review of Systems ?Review of Systems  ?HENT:  Positive for sore throat.   ?Eyes:  Positive for discharge, redness and itching.  ?All other systems reviewed and are negative. ? ? ?Physical Exam ?Triage Vital Signs ?ED Triage Vitals  ?Enc Vitals Group  ?   BP 08/16/21 1558 (!) 174/106  ?   Pulse Rate 08/16/21 1558 81  ?   Resp 08/16/21 1558 16  ?   Temp 08/16/21 1558 98.4 ?F (36.9 ?C)  ?   Temp Source 08/16/21 1558 Oral  ?   SpO2 08/16/21 1558 98 %  ?   Weight --   ?   Height --   ?   Head Circumference --   ?   Peak Flow --   ?   Pain Score 08/16/21 1556 7  ?   Pain Loc --   ?   Pain Edu? --   ?   Excl. in GC? --   ? ?No data found. ? ?Updated Vital Signs ?BP (!) 174/106 (BP Location: Right Arm) Comment: Pt states she takes her BP meds at night  Pulse 81   Temp 98.4 ?F (36.9 ?  C) (Oral)   Resp 16   SpO2 98%  ? ?Visual Acuity ?Right Eye Distance:   ?Left Eye Distance:   ?Bilateral Distance:   ? ?Right Eye Near:   ?Left Eye Near:    ?Bilateral Near:    ? ?Physical Exam ?Vitals reviewed.  ?Constitutional:   ?   General: She is not in acute distress. ?   Appearance: Normal appearance. She is not ill-appearing.  ?HENT:  ?   Head: Normocephalic and atraumatic.  ?   Right Ear: Hearing, tympanic membrane, ear canal and external ear normal. No tenderness. No middle ear effusion. There is no impacted cerumen. No mastoid tenderness. Tympanic membrane is not perforated, erythematous, retracted or bulging.  ?   Left Ear: Hearing, tympanic membrane, ear canal and external ear normal. No tenderness.  No middle ear effusion. There is no impacted cerumen. No mastoid tenderness. Tympanic membrane is not perforated, erythematous, retracted or bulging.  ?   Mouth/Throat:  ?   Pharynx: Posterior oropharyngeal erythema present.  ?    Tonsils: No tonsillar exudate. 1+ on the right. 1+ on the left.  ?   Comments: Tonsils are 1+ bilaterally with erythema and cobblestoning. On exam, uvula is midline, she is tolerating her secretions without difficulty, there is no trismus, no drooling, she has normal phonation ? ?Eyes:  ?   General: Lids are normal. Lids are everted, no foreign bodies appreciated. Vision grossly intact. Gaze aligned appropriately. No visual field deficit.    ?   Right eye: No foreign body, discharge or hordeolum.     ?   Left eye: No foreign body, discharge or hordeolum.  ?   Extraocular Movements: Extraocular movements intact.  ?   Right eye: No nystagmus.  ?   Left eye: No nystagmus.  ?   Conjunctiva/sclera:  ?   Right eye: Right conjunctiva is injected. No chemosis, exudate or hemorrhage. ?   Left eye: Left conjunctiva is injected. No chemosis, exudate or hemorrhage. ?   Pupils: Pupils are equal, round, and reactive to light. Pupils are equal.  ?   Right eye: No corneal abrasion or fluorescein uptake. Seidel exam negative.  ?   Left eye: No corneal abrasion or fluorescein uptake. Seidel exam negative. ?   Visual Fields: Right eye visual fields normal and left eye visual fields normal.  ?   Comments: Conjunctival injection bilaterally with watering but no exudate. No lid changes. PERRLA. EOMI without pain. No proptosis.  ?Anterior chamber, conjunctivae, sclera all without hemorrhage or visible injury. Visual acuity grossly intact.   ?Cardiovascular:  ?   Rate and Rhythm: Normal rate and regular rhythm.  ?   Heart sounds: Normal heart sounds.  ?Pulmonary:  ?   Effort: Pulmonary effort is normal.  ?   Breath sounds: Normal breath sounds and air entry.  ?Lymphadenopathy:  ?   Cervical: No cervical adenopathy.  ?Neurological:  ?   General: No focal deficit present.  ?   Mental Status: She is alert and oriented to person, place, and time.  ?Psychiatric:     ?   Attention and Perception: Attention and perception normal.     ?   Mood and  Affect: Mood and affect normal.  ? ? ? ?UC Treatments / Results  ?Labs ?(all labs ordered are listed, but only abnormal results are displayed) ?Labs Reviewed  ?CULTURE, GROUP A STREP [facility])  ?POCT RAPID STREP A (OFFICE)  ? ? ?EKG ? ? ?Radiology ?No results found. ? ?  Procedures ?Procedures (including critical care time) ? ?Medications Ordered in UC ?Medications - No data to display ? ?Initial Impression / Assessment and Plan / UC Course  ?I have reviewed the triage vital signs and the nursing notes. ? ?Pertinent labs & imaging results that were available during my care of the patient were reviewed by me and considered in my medical decision making (see chart for details). ? ?  ? ?This patient is a very pleasant 71 y.o. year old female presenting with bacterial conjunctivitis and pharyngitis. Visual acuity grossly intact, does not wear contacts. Nurse unfortunately discharged this patient without checking a visual acuity.  ? ?Polytrim sent.  ? ?Rapid strep negative, culture sent.  ? ?ED return precautions discussed. Patient verbalizes understanding and agreement.  ? ? ?Final Clinical Impressions(s) / UC Diagnoses  ? ?Final diagnoses:  ?Bacterial conjunctivitis  ?Screening for streptococcal infection  ? ? ? ?Discharge Instructions   ? ?  ?-You have bacterial conjunctivitis (pinkeye). ?-Start the antibiotic drops, Polytrim, 1 drop into the affected eye/s every four hours while awake for 7 days. ?-You can also try warm compresses or washing the face with gentle soap and water to remove crust in the morning. ?-Your symptoms should be a lot better in about 1 day, and you'll also no longer be contagious after 1 day on the antibiotic. ?-If your symptoms are getting worse instead of better, like pain, discharge, itching, vision changes-follow-up with your eye doctor as soon as possible. ?-Do not wear contact lenses until you have completed treatment (7 days!). Wear glasses only. ? ? ? ? ? ?ED Prescriptions   ? ? Medication  Sig Dispense Auth. Provider  ? trimethoprim-polymyxin b (POLYTRIM) ophthalmic solution Place 1 drop into both eyes every 4 (four) hours. 1 drop into both eyes every 4 hours while awake x7 days. 10 mL Cheree DittoGraham, L

## 2021-08-16 NOTE — Discharge Instructions (Addendum)
-  You have bacterial conjunctivitis (pinkeye). ?-Start the antibiotic drops, Polytrim, 1 drop into the affected eye/s every four hours while awake for 7 days. ?-You can also try warm compresses or washing the face with gentle soap and water to remove crust in the morning. ?-Your symptoms should be a lot better in about 1 day, and you'll also no longer be contagious after 1 day on the antibiotic. ?-If your symptoms are getting worse instead of better, like pain, discharge, itching, vision changes-follow-up with your eye doctor as soon as possible. ?-Do not wear contact lenses until you have completed treatment (7 days!). Wear glasses only. ? ?

## 2021-08-16 NOTE — ED Triage Notes (Signed)
Pt states that last week her granddaughters both had one eye that was red. ? ?Pt states her eyes have been red, swollen and draining since Friday ? ?Pt states she has tried hot and compresses with short term relief ? ?Pt states she started having a sore throat with drainage yesterday ?

## 2021-08-19 LAB — CULTURE, GROUP A STREP (THRC)

## 2021-10-20 DIAGNOSIS — E6609 Other obesity due to excess calories: Secondary | ICD-10-CM | POA: Diagnosis not present

## 2021-10-20 DIAGNOSIS — I10 Essential (primary) hypertension: Secondary | ICD-10-CM | POA: Diagnosis not present

## 2021-10-20 DIAGNOSIS — Z6833 Body mass index (BMI) 33.0-33.9, adult: Secondary | ICD-10-CM | POA: Diagnosis not present

## 2021-10-20 DIAGNOSIS — Z1331 Encounter for screening for depression: Secondary | ICD-10-CM | POA: Diagnosis not present

## 2021-10-20 DIAGNOSIS — R32 Unspecified urinary incontinence: Secondary | ICD-10-CM | POA: Diagnosis not present

## 2021-10-20 DIAGNOSIS — Z0001 Encounter for general adult medical examination with abnormal findings: Secondary | ICD-10-CM | POA: Diagnosis not present

## 2021-10-20 DIAGNOSIS — N3281 Overactive bladder: Secondary | ICD-10-CM | POA: Diagnosis not present

## 2021-11-18 DIAGNOSIS — L820 Inflamed seborrheic keratosis: Secondary | ICD-10-CM | POA: Diagnosis not present

## 2021-11-18 DIAGNOSIS — L570 Actinic keratosis: Secondary | ICD-10-CM | POA: Diagnosis not present

## 2021-11-18 DIAGNOSIS — X32XXXA Exposure to sunlight, initial encounter: Secondary | ICD-10-CM | POA: Diagnosis not present

## 2021-11-18 DIAGNOSIS — C44319 Basal cell carcinoma of skin of other parts of face: Secondary | ICD-10-CM | POA: Diagnosis not present

## 2021-11-18 DIAGNOSIS — L918 Other hypertrophic disorders of the skin: Secondary | ICD-10-CM | POA: Diagnosis not present

## 2022-08-16 DIAGNOSIS — H2513 Age-related nuclear cataract, bilateral: Secondary | ICD-10-CM | POA: Diagnosis not present

## 2022-08-16 DIAGNOSIS — H40033 Anatomical narrow angle, bilateral: Secondary | ICD-10-CM | POA: Diagnosis not present

## 2022-11-02 DIAGNOSIS — E6609 Other obesity due to excess calories: Secondary | ICD-10-CM | POA: Diagnosis not present

## 2022-11-02 DIAGNOSIS — Z6833 Body mass index (BMI) 33.0-33.9, adult: Secondary | ICD-10-CM | POA: Diagnosis not present

## 2022-11-02 DIAGNOSIS — I10 Essential (primary) hypertension: Secondary | ICD-10-CM | POA: Diagnosis not present

## 2022-11-02 DIAGNOSIS — L03119 Cellulitis of unspecified part of limb: Secondary | ICD-10-CM | POA: Diagnosis not present

## 2022-11-02 DIAGNOSIS — I872 Venous insufficiency (chronic) (peripheral): Secondary | ICD-10-CM | POA: Diagnosis not present

## 2022-11-02 DIAGNOSIS — I83009 Varicose veins of unspecified lower extremity with ulcer of unspecified site: Secondary | ICD-10-CM | POA: Diagnosis not present

## 2022-11-02 DIAGNOSIS — R600 Localized edema: Secondary | ICD-10-CM | POA: Diagnosis not present

## 2022-11-04 ENCOUNTER — Encounter (INDEPENDENT_AMBULATORY_CARE_PROVIDER_SITE_OTHER): Payer: Self-pay | Admitting: *Deleted

## 2022-12-02 ENCOUNTER — Emergency Department (HOSPITAL_COMMUNITY)
Admission: EM | Admit: 2022-12-02 | Discharge: 2022-12-03 | Disposition: A | Discharge status: Home / Self Care | Payer: Medicare PPO | Attending: Emergency Medicine | Admitting: Emergency Medicine

## 2022-12-02 ENCOUNTER — Other Ambulatory Visit: Payer: Self-pay

## 2022-12-02 ENCOUNTER — Emergency Department (HOSPITAL_COMMUNITY): Payer: Medicare PPO

## 2022-12-02 ENCOUNTER — Encounter (HOSPITAL_COMMUNITY): Payer: Self-pay

## 2022-12-02 DIAGNOSIS — Y92002 Bathroom of unspecified non-institutional (private) residence single-family (private) house as the place of occurrence of the external cause: Secondary | ICD-10-CM | POA: Diagnosis not present

## 2022-12-02 DIAGNOSIS — S199XXA Unspecified injury of neck, initial encounter: Secondary | ICD-10-CM | POA: Diagnosis not present

## 2022-12-02 DIAGNOSIS — S0003XA Contusion of scalp, initial encounter: Secondary | ICD-10-CM | POA: Insufficient documentation

## 2022-12-02 DIAGNOSIS — R0689 Other abnormalities of breathing: Secondary | ICD-10-CM | POA: Diagnosis not present

## 2022-12-02 DIAGNOSIS — R55 Syncope and collapse: Secondary | ICD-10-CM | POA: Insufficient documentation

## 2022-12-02 DIAGNOSIS — Z79899 Other long term (current) drug therapy: Secondary | ICD-10-CM | POA: Diagnosis not present

## 2022-12-02 DIAGNOSIS — R519 Headache, unspecified: Secondary | ICD-10-CM | POA: Diagnosis not present

## 2022-12-02 DIAGNOSIS — R197 Diarrhea, unspecified: Secondary | ICD-10-CM | POA: Insufficient documentation

## 2022-12-02 DIAGNOSIS — M5030 Other cervical disc degeneration, unspecified cervical region: Secondary | ICD-10-CM | POA: Diagnosis not present

## 2022-12-02 DIAGNOSIS — M47812 Spondylosis without myelopathy or radiculopathy, cervical region: Secondary | ICD-10-CM | POA: Diagnosis not present

## 2022-12-02 DIAGNOSIS — W1812XA Fall from or off toilet with subsequent striking against object, initial encounter: Secondary | ICD-10-CM | POA: Insufficient documentation

## 2022-12-02 DIAGNOSIS — I10 Essential (primary) hypertension: Secondary | ICD-10-CM

## 2022-12-02 DIAGNOSIS — I6782 Cerebral ischemia: Secondary | ICD-10-CM | POA: Diagnosis not present

## 2022-12-02 DIAGNOSIS — S0990XA Unspecified injury of head, initial encounter: Secondary | ICD-10-CM | POA: Diagnosis not present

## 2022-12-02 DIAGNOSIS — R0902 Hypoxemia: Secondary | ICD-10-CM | POA: Diagnosis not present

## 2022-12-02 LAB — URINALYSIS, ROUTINE W REFLEX MICROSCOPIC
Bilirubin Urine: NEGATIVE
Glucose, UA: NEGATIVE mg/dL
Hgb urine dipstick: NEGATIVE
Ketones, ur: NEGATIVE mg/dL
Leukocytes,Ua: NEGATIVE
Nitrite: NEGATIVE
Protein, ur: NEGATIVE mg/dL
Specific Gravity, Urine: 1.012 (ref 1.005–1.030)
pH: 7 (ref 5.0–8.0)

## 2022-12-02 LAB — CBC WITH DIFFERENTIAL/PLATELET
Abs Immature Granulocytes: 0.07 10*3/uL (ref 0.00–0.07)
Basophils Absolute: 0.1 10*3/uL (ref 0.0–0.1)
Basophils Relative: 1 %
Eosinophils Absolute: 0.2 10*3/uL (ref 0.0–0.5)
Eosinophils Relative: 2 %
HCT: 35.6 % — ABNORMAL LOW (ref 36.0–46.0)
Hemoglobin: 11.2 g/dL — ABNORMAL LOW (ref 12.0–15.0)
Immature Granulocytes: 1 %
Lymphocytes Relative: 11 %
Lymphs Abs: 1.1 10*3/uL (ref 0.7–4.0)
MCH: 28.6 pg (ref 26.0–34.0)
MCHC: 31.5 g/dL (ref 30.0–36.0)
MCV: 91 fL (ref 80.0–100.0)
Monocytes Absolute: 0.6 10*3/uL (ref 0.1–1.0)
Monocytes Relative: 6 %
Neutro Abs: 8 10*3/uL — ABNORMAL HIGH (ref 1.7–7.7)
Neutrophils Relative %: 79 %
Platelets: 199 10*3/uL (ref 150–400)
RBC: 3.91 MIL/uL (ref 3.87–5.11)
RDW: 15.6 % — ABNORMAL HIGH (ref 11.5–15.5)
WBC: 10.1 10*3/uL (ref 4.0–10.5)
nRBC: 0 % (ref 0.0–0.2)

## 2022-12-02 LAB — BASIC METABOLIC PANEL
Anion gap: 8 (ref 5–15)
BUN: 19 mg/dL (ref 8–23)
CO2: 26 mmol/L (ref 22–32)
Calcium: 8.8 mg/dL — ABNORMAL LOW (ref 8.9–10.3)
Chloride: 104 mmol/L (ref 98–111)
Creatinine, Ser: 1.03 mg/dL — ABNORMAL HIGH (ref 0.44–1.00)
GFR, Estimated: 57 mL/min — ABNORMAL LOW (ref >60)
Glucose, Bld: 167 mg/dL — ABNORMAL HIGH (ref 70–99)
Potassium: 3.8 mmol/L (ref 3.5–5.1)
Sodium: 138 mmol/L (ref 135–145)

## 2022-12-02 NOTE — ED Provider Notes (Signed)
[facility] EMERGENCY DEPARTMENT AT [facility] Provider Note   CSN: [id number] Arrival date & time: 12/02/22  2209     History {Add pertinent medical, surgical, social history, OB history to HPI:1} Chief Complaint  Patient presents with   Dizziness    Jody Hernandez is a 72 y.o. female.  Patient presents to the emergency department after syncopal episode.  She reports that she had a very busy day today running many errands.  When she got home she started to feel poorly, like she was going to have diarrhea.  She went to the bathroom, had a single episode of diarrhea and then passed out.  She hit the back of her head when she fell.  Patient reports that she feels tired now but no other specific complaints.       Home Medications Prior to Admission medications   Medication Sig Start Date End Date Taking? Authorizing Provider  lisinopril (PRINIVIL,ZESTRIL) 20 MG tablet Take 20 mg by mouth daily.    [provider]  trimethoprim-polymyxin b (POLYTRIM) ophthalmic solution Place 1 drop into both eyes every 4 (four) hours. 1 drop into both eyes every 4 hours while awake x7 days. 08/16/21   Rhys Martini, PA-C      Allergies    Patient has no known allergies.    Review of Systems   Review of Systems  Physical Exam Updated Vital Signs BP (!) 161/102   Pulse 92   Temp 98.1 F (36.7 C) (Oral)   Resp 15   Ht 5\' 2"  (1.575 m)   Wt 82.1 kg   SpO2 93%   BMI 33.11 kg/m  Physical Exam Vitals and nursing note reviewed.  Constitutional:      General: She is not in acute distress.    Appearance: She is well-developed.  HENT:     Head: Normocephalic. Contusion (Posterior) present.     Mouth/Throat:     Mouth: Mucous membranes are moist.  Eyes:     General: Vision grossly intact. Gaze aligned appropriately.     Extraocular Movements: Extraocular movements intact.     Conjunctiva/sclera: Conjunctivae normal.  Cardiovascular:     Rate and Rhythm: Normal rate and  regular rhythm.     Pulses: Normal pulses.     Heart sounds: Normal heart sounds, S1 normal and S2 normal. No murmur heard.    No friction rub. No gallop.  Pulmonary:     Effort: Pulmonary effort is normal. No respiratory distress.     Breath sounds: Normal breath sounds.  Abdominal:     General: Bowel sounds are normal.     Palpations: Abdomen is soft.     Tenderness: There is no abdominal tenderness. There is no guarding or rebound.     Hernia: No hernia is present.  Musculoskeletal:        General: No swelling.     Cervical back: Full passive range of motion without pain, normal range of motion and neck supple. No spinous process tenderness or muscular tenderness. Normal range of motion.     Right lower leg: No edema.     Left lower leg: No edema.  Skin:    General: Skin is warm and dry.     Capillary Refill: Capillary refill takes less than 2 seconds.     Findings: No ecchymosis, erythema, rash or wound.  Neurological:     General: No focal deficit present.     Mental Status: She is alert and oriented to  person, place, and time.     GCS: GCS eye subscore is 4. GCS verbal subscore is 5. GCS motor subscore is 6.     Cranial Nerves: Cranial nerves 2-12 are intact.     Sensory: Sensation is intact.     Motor: Motor function is intact.     Coordination: Coordination is intact.  Psychiatric:        Attention and Perception: Attention normal.        Mood and Affect: Mood normal.        Speech: Speech normal.        Behavior: Behavior normal.     ED Results / Procedures / Treatments   Labs (all labs ordered are listed, but only abnormal results are displayed) Labs Reviewed  CBC WITH DIFFERENTIAL/PLATELET - Abnormal; Notable for the following components:      Result Value   Hemoglobin 11.2 (*)    HCT 35.6 (*)    RDW 15.6 (*)    Neutro Abs 8.0 (*)    All other components within normal limits  BASIC METABOLIC PANEL - Abnormal; Notable for the following components:   Glucose,  Bld 167 (*)    Creatinine, Ser 1.03 (*)    Calcium 8.8 (*)    GFR, Estimated 57 (*)    All other components within normal limits  URINALYSIS, ROUTINE W REFLEX MICROSCOPIC    EKG EKG Interpretation Date/Time:  Friday December 02 2022 22:20:35 EDT Ventricular Rate:  89 PR Interval:  172 QRS Duration:  92 QT Interval:  361 QTC Calculation: 440 R Axis:   62  Text Interpretation: Sinus rhythm Normal ECG Confirmed by Gilda Crease [phone]) on 12/02/2022 11:04:53 PM  Radiology CT Head Wo Contrast  Result Date: 12/02/2022 CLINICAL DATA:  Head trauma, minor (Age >= 65y) EXAM: CT HEAD WITHOUT CONTRAST TECHNIQUE: Contiguous axial images were obtained from the base of the skull through the vertex without intravenous contrast. RADIATION DOSE REDUCTION: This exam was performed according to the departmental dose-optimization program which includes automated exposure control, adjustment of the mA and/or kV according to patient size and/or use of iterative reconstruction technique. COMPARISON:  None Available. FINDINGS: Brain: No intracranial hemorrhage, mass effect, or midline shift. No hydrocephalus. The basilar cisterns are patent. Minor periventricular and deep white matter hypodensity typical of chronic small vessel ischemia. No evidence of territorial infarct or acute ischemia. No extra-axial or intracranial fluid collection. Vascular: No hyperdense vessel or unexpected calcification. Skull: No fracture or focal lesion. Sinuses/Orbits: Paranasal sinuses and mastoid air cells are clear. The visualized orbits are unremarkable. Other: Right parietal scalp hematoma. IMPRESSION: 1. Right parietal scalp hematoma. No acute intracranial abnormality. No skull fracture. 2. Minor chronic small vessel ischemia. Electronically Signed   By: Narda Rutherford M.D.   On: 12/02/2022 22:56   CT Cervical Spine Wo Contrast  Result Date: 12/02/2022 CLINICAL DATA:  Neck trauma (Age >= 65y) EXAM: CT CERVICAL SPINE WITHOUT  CONTRAST TECHNIQUE: Multidetector CT imaging of the cervical spine was performed without intravenous contrast. Multiplanar CT image reconstructions were also generated. RADIATION DOSE REDUCTION: This exam was performed according to the departmental dose-optimization program which includes automated exposure control, adjustment of the mA and/or kV according to patient size and/or use of iterative reconstruction technique. COMPARISON:  None Available. FINDINGS: Alignment: Normal. Skull base and vertebrae: No acute fracture. Vertebral body heights are maintained. The dens and skull base are intact. Non fusion posterior arch of C1, normal variant. Soft tissues and spinal canal: No  prevertebral fluid or swelling. No visible canal hematoma. Disc levels: Disc space narrowing and spurring at C5-C6 and C6-C7. Minor multilevel facet hypertrophy. Upper chest: No acute findings. Other: None. IMPRESSION: Mild degenerative change in the cervical spine without acute fracture or subluxation. Electronically Signed   By: Narda Rutherford M.D.   On: 12/02/2022 22:51    Procedures Procedures  {Document cardiac monitor, telemetry assessment procedure when appropriate:1}  Medications Ordered in ED Medications - No data to display  ED Course/ Medical Decision Making/ A&P   {   Click here for ABCD2, HEART and other calculatorsREFRESH Note before signing :1}                          Medical Decision Making Amount and/or Complexity of Data Reviewed External Data Reviewed: ECG. Labs: ordered. Decision-making details documented in ED Course. Radiology: ordered and independent interpretation performed. Decision-making details documented in ED Course. ECG/medicine tests: ordered and independent interpretation performed. Decision-making details documented in ED Course.   Differential Diagnosis considered includes, but not limited to: Arrhythmia; MI; vasovagal episode; seizure; toxidrome; PE; stroke  Presents to the  emergency department for a brief syncopal episode.  Patient passed out after having a diarrhea episode.  She feels better now, back to her baseline.  No abdominal pain.  No chest pain, shortness of breath.  Vital signs are normal.  No fever, no tachycardia.  CT head, cervical spine negative.  Labs unremarkable.  Patient's neurologic exam is normal, no focal deficits.  Suspect syncopal episode was related to GI distress having the diarrhea stool, symptoms now resolved.  No further workup necessary.  {Document critical care time when appropriate:1} {Document review of labs and clinical decision tools ie heart score, Chads2Vasc2 etc:1}  {Document your independent review of radiology images, and any outside records:1} {Document your discussion with family members, caretakers, and with consultants:1} {Document social determinants of health affecting pt's care:1} {Document your decision making why or why not admission, treatments were needed:1} Final Clinical Impression(s) / ED Diagnoses Final diagnoses:  Syncope, unspecified syncope type  Contusion of scalp, initial encounter    Rx / DC Orders ED Discharge Orders     None

## 2022-12-02 NOTE — ED Triage Notes (Signed)
Pt was sitting on the toilet at her home, felt dizzy. Slid off the toilet and struck the right back of her head on the edge of the bathroom. Sm hematoma noted, bleeding controlled.

## 2022-12-02 NOTE — ED Notes (Signed)
Patient transported to CT via stretcher.

## 2022-12-03 MED ORDER — LABETALOL HCL 5 MG/ML IV SOLN
10.0000 mg | Freq: Once | INTRAVENOUS | Status: AC
  Administered 2022-12-03: 10 mg via INTRAVENOUS
  Filled 2022-12-03: qty 4

## 2022-12-03 NOTE — Discharge Instructions (Signed)
Take your blood pressure medication this morning when you get up

## 2022-12-08 DIAGNOSIS — Z6832 Body mass index (BMI) 32.0-32.9, adult: Secondary | ICD-10-CM | POA: Diagnosis not present

## 2022-12-08 DIAGNOSIS — I10 Essential (primary) hypertension: Secondary | ICD-10-CM | POA: Diagnosis not present

## 2022-12-08 DIAGNOSIS — Z0001 Encounter for general adult medical examination with abnormal findings: Secondary | ICD-10-CM | POA: Diagnosis not present

## 2022-12-08 DIAGNOSIS — Z1331 Encounter for screening for depression: Secondary | ICD-10-CM | POA: Diagnosis not present

## 2022-12-08 DIAGNOSIS — E6609 Other obesity due to excess calories: Secondary | ICD-10-CM | POA: Diagnosis not present

## 2022-12-08 DIAGNOSIS — I872 Venous insufficiency (chronic) (peripheral): Secondary | ICD-10-CM | POA: Diagnosis not present

## 2022-12-08 DIAGNOSIS — R600 Localized edema: Secondary | ICD-10-CM | POA: Diagnosis not present

## 2022-12-12 ENCOUNTER — Telehealth: Payer: Self-pay

## 2022-12-12 NOTE — Telephone Encounter (Signed)
 Transition Care Management Unsuccessful Follow-up Telephone Call  Date of discharge and from where:  Jody [facility] 7/6  Attempts:  1st Attempt  Reason for unsuccessful TCM follow-up call:  No answer/busy   Jody Hernandez [facility] Guide, [facility] [phone] [address] [place], [place], [place] [address] Phone: [phone] Email: [email or web address]

## 2022-12-12 NOTE — Telephone Encounter (Signed)
Transition Care Management Unsuccessful Follow-up Telephone Call  Date of discharge and from where:  [facility]  Attempts:  2nd Attempt  Reason for unsuccessful TCM follow-up call:  No answer/busy   Lenard Forth [facility] Guide, [facility] [phone] [address] [place], [place], [place] [address] Phone: [phone] Email: [email or web address]

## 2022-12-15 DIAGNOSIS — G9332 Myalgic encephalomyelitis/chronic fatigue syndrome: Secondary | ICD-10-CM | POA: Diagnosis not present

## 2022-12-15 DIAGNOSIS — Z0001 Encounter for general adult medical examination with abnormal findings: Secondary | ICD-10-CM | POA: Diagnosis not present

## 2022-12-15 DIAGNOSIS — D518 Other vitamin B12 deficiency anemias: Secondary | ICD-10-CM | POA: Diagnosis not present

## 2022-12-15 DIAGNOSIS — E559 Vitamin D deficiency, unspecified: Secondary | ICD-10-CM | POA: Diagnosis not present

## 2023-01-03 DIAGNOSIS — I872 Venous insufficiency (chronic) (peripheral): Secondary | ICD-10-CM | POA: Diagnosis not present

## 2023-01-03 DIAGNOSIS — I83009 Varicose veins of unspecified lower extremity with ulcer of unspecified site: Secondary | ICD-10-CM | POA: Diagnosis not present

## 2023-01-03 DIAGNOSIS — I10 Essential (primary) hypertension: Secondary | ICD-10-CM | POA: Diagnosis not present

## 2023-01-03 DIAGNOSIS — E6609 Other obesity due to excess calories: Secondary | ICD-10-CM | POA: Diagnosis not present

## 2023-01-03 DIAGNOSIS — R600 Localized edema: Secondary | ICD-10-CM | POA: Diagnosis not present

## 2023-01-03 DIAGNOSIS — Z6832 Body mass index (BMI) 32.0-32.9, adult: Secondary | ICD-10-CM | POA: Diagnosis not present

## 2023-01-03 DIAGNOSIS — L03119 Cellulitis of unspecified part of limb: Secondary | ICD-10-CM | POA: Diagnosis not present

## 2023-01-12 DIAGNOSIS — Z6832 Body mass index (BMI) 32.0-32.9, adult: Secondary | ICD-10-CM | POA: Diagnosis not present

## 2023-01-12 DIAGNOSIS — I83009 Varicose veins of unspecified lower extremity with ulcer of unspecified site: Secondary | ICD-10-CM | POA: Diagnosis not present

## 2023-01-12 DIAGNOSIS — I10 Essential (primary) hypertension: Secondary | ICD-10-CM | POA: Diagnosis not present

## 2023-01-12 DIAGNOSIS — L03119 Cellulitis of unspecified part of limb: Secondary | ICD-10-CM | POA: Diagnosis not present

## 2023-01-12 DIAGNOSIS — E6609 Other obesity due to excess calories: Secondary | ICD-10-CM | POA: Diagnosis not present

## 2023-01-16 DIAGNOSIS — Z6832 Body mass index (BMI) 32.0-32.9, adult: Secondary | ICD-10-CM | POA: Diagnosis not present

## 2023-01-16 DIAGNOSIS — I83009 Varicose veins of unspecified lower extremity with ulcer of unspecified site: Secondary | ICD-10-CM | POA: Diagnosis not present

## 2023-01-16 DIAGNOSIS — I10 Essential (primary) hypertension: Secondary | ICD-10-CM | POA: Diagnosis not present

## 2023-01-16 DIAGNOSIS — R600 Localized edema: Secondary | ICD-10-CM | POA: Diagnosis not present

## 2023-01-16 DIAGNOSIS — E6609 Other obesity due to excess calories: Secondary | ICD-10-CM | POA: Diagnosis not present

## 2023-01-16 DIAGNOSIS — I872 Venous insufficiency (chronic) (peripheral): Secondary | ICD-10-CM | POA: Diagnosis not present

## 2023-01-16 DIAGNOSIS — L03119 Cellulitis of unspecified part of limb: Secondary | ICD-10-CM | POA: Diagnosis not present

## 2023-01-17 ENCOUNTER — Other Ambulatory Visit (HOSPITAL_COMMUNITY): Payer: Self-pay | Admitting: Internal Medicine

## 2023-01-17 DIAGNOSIS — L97909 Non-pressure chronic ulcer of unspecified part of unspecified lower leg with unspecified severity: Secondary | ICD-10-CM

## 2023-01-17 DIAGNOSIS — M79605 Pain in left leg: Secondary | ICD-10-CM

## 2023-01-18 ENCOUNTER — Ambulatory Visit (HOSPITAL_COMMUNITY)
Admission: RE | Admit: 2023-01-18 | Discharge: 2023-01-18 | Disposition: A | Discharge status: Home / Self Care | Payer: Medicare PPO | Source: Ambulatory Visit | Attending: Internal Medicine | Admitting: Internal Medicine

## 2023-01-18 DIAGNOSIS — I83009 Varicose veins of unspecified lower extremity with ulcer of unspecified site: Secondary | ICD-10-CM | POA: Diagnosis not present

## 2023-01-18 DIAGNOSIS — L97909 Non-pressure chronic ulcer of unspecified part of unspecified lower leg with unspecified severity: Secondary | ICD-10-CM | POA: Diagnosis not present

## 2023-01-18 DIAGNOSIS — R600 Localized edema: Secondary | ICD-10-CM | POA: Diagnosis not present

## 2023-01-18 DIAGNOSIS — M79605 Pain in left leg: Secondary | ICD-10-CM | POA: Insufficient documentation

## 2023-01-19 ENCOUNTER — Ambulatory Visit (HOSPITAL_COMMUNITY): Payer: Medicare PPO | Attending: Internal Medicine | Admitting: Physical Therapy

## 2023-01-19 ENCOUNTER — Encounter (HOSPITAL_COMMUNITY): Payer: Self-pay | Admitting: Physical Therapy

## 2023-01-19 DIAGNOSIS — I872 Venous insufficiency (chronic) (peripheral): Secondary | ICD-10-CM | POA: Diagnosis not present

## 2023-01-19 DIAGNOSIS — R2689 Other abnormalities of gait and mobility: Secondary | ICD-10-CM | POA: Diagnosis not present

## 2023-01-19 NOTE — Therapy (Signed)
OUTPATIENT PHYSICAL THERAPY WOUND EVALUATION   Patient Name: Jody Hernandez MRN: [id number] DOB:29-May-1951, 72 y.o., female Today's Date: 01/19/2023   PCP: Elfredia Nevins, MD REFERRING PROVIDER: Elfredia Nevins, MD  END OF SESSION:  PT End of Session - 01/19/23 1342     Visit Number 1    Number of Visits 12    Date for PT Re-Evaluation 03/02/23    Authorization Type [employer]    Authorization Time Period 12 visits requested - check auth    Progress Note Due on Visit 10    PT Start Time 1345    PT Stop Time 1415    PT Time Calculation (min) 30 min    Activity Tolerance Patient tolerated treatment well    Behavior During Therapy WFL for tasks assessed/performed             Past Medical History:  Diagnosis Date   Hypertension    Past Surgical History:  Procedure Laterality Date   BREAST SURGERY     lumpectomy   CYSTOSCOPY WITH STENT PLACEMENT Left 03/09/2017   Procedure: CYSTOSCOPY, RETROGRADE PYELOGRAM WITH STENT PLACEMENT;  Surgeon: Alfredo Martinez, MD;  Location: [facility];  Service: Urology;  Laterality: Left;   Patient Active Problem List   Diagnosis Date Noted   Sepsis (HCC) 03/09/2017   Ureteral stone with hydronephrosis    Hypertension 03/08/2017   Sepsis secondary to UTI (HCC) 03/08/2017   Urinary tract obstruction by kidney stone 03/08/2017    ONSET DATE: 1 month approx.  REFERRING DIAG: L03.119 (ICD-10-CM) - Cellulitis of unspecified part of limb I83.009 (ICD-10-CM) - Varicose veins of unspecified lower extremity with ulcer of unspecified site I87.2 (ICD-10-CM) - Venous insufficiency (chronic) (peripheral)  THERAPY DIAG:  Other abnormalities of gait and mobility  Peripheral venous insufficiency  Rationale for Evaluation and Treatment: Rehabilitation     Wound Therapy - 01/19/23 0001     Subjective Patient has had wound for past month, been on 2 antibiotics. Has not been wearing compression garments.    Patient and Family Stated Goals  wound to heal    Date of Onset 12/19/22    Prior Treatments antibiotics    Pain Scale Faces    Faces Pain Scale Hurts even more   with debridement   Evaluation and Treatment Procedures Explained to Patient/Family Yes    Evaluation and Treatment Procedures agreed to    Wound Properties Date First Assessed: 01/19/23 Time First Assessed: 1345 Wound Type: Venous stasis ulcer Location: Pretibial Location Orientation: Left Wound Description (Comments): anterior tibial wound Present on Admission: Yes   Wound Image Images linked: 1    Dressing Type Gauze (Comment)    Dressing Changed Changed    Dressing Status Old drainage    Dressing Change Frequency PRN    Site / Wound Assessment Yellow;Red;Painful    % Wound base Red or Granulating 20%    % Wound base Yellow/Fibrinous Exudate 80%    Peri-wound Assessment Erythema (non-blanchable)    Wound Length (cm) 5.2 cm    Wound Width (cm) 4.2 cm    Wound Surface Area (cm^2) 21.84 cm^2    Drainage Amount Minimal    Drainage Description Serous    Treatment Cleansed;Debridement (Selective)    Selective Debridement (non-excisional) - Location anterior tibial region wound    Selective Debridement (non-excisional) - Tools Used Forceps    Selective Debridement (non-excisional) - Tissue Removed slough    Wound Therapy - Clinical Statement see below    Wound Therapy -  Functional Problem List bathing    Factors Delaying/Impairing Wound Healing Vascular compromise    Hydrotherapy Plan Debridement;Dressing change;Electrical stimulation;Patient/family education;Pulsatile lavage with suction;Ultrasonic wound therapy @35  KHz (+/- 3 KHz)    Wound Therapy - Frequency 2X / week    Wound Therapy - Current Recommendations PT    Wound Plan debride and dressing changes PRN    Dressing  vaseline to LLE, medihoney, 4x4, abd for shaping, profore lite               PATIENT EDUCATION: Education details: Patient educated on exam findings, POC, scope of PT, dressing  changes if soiled. Person educated: Patient Education method: Explanation Education comprehension: verbalized understanding   HOME EXERCISE PROGRAM:    GOALS: Goals reviewed with patient? Yes  SHORT TERM GOALS: Target date: 02/09/2023    Wound to decrease in size by half to demonstrate wound healing. Baseline: Goal status: INITIAL  2.  Wound to be free from slough.  Baseline:  Goal status: INITIAL    LONG TERM GOALS: Target date: 03/02/2023    Wound to be healed to reduce risk of infection Baseline:  Goal status: INITIAL   ASSESSMENT:  CLINICAL IMPRESSION: Patient a 72 y.o. y.o. female who was seen today for physical therapy evaluation and treatment for wound due to chronic venous insufficiency. Patient presents with anterior tibial region wound with erythema and edema present. Able to debride some slough from wound bed but deeper slough very adherent and unable to remove. Dressed with medhoney followed by profore for compression to reduce edema. Patient will benefit from skilled physical therapy in order to promote wound healing.   OBJECTIVE IMPAIRMENTS: Abnormal gait, decreased activity tolerance, decreased balance, decreased endurance, increased edema, improper body mechanics, and pain.   ACTIVITY LIMITATIONS: bending, standing, squatting, bathing, hygiene/grooming, locomotion level, and caring for others  PARTICIPATION LIMITATIONS: meal prep, cleaning, laundry, shopping, community activity, and yard work  PERSONAL FACTORS: Fitness, Past/current experiences, Time since onset of injury/illness/exacerbation, and 3+ comorbidities: varicose veins, HTN , venous insufficiency  are also affecting patient's functional outcome.   REHAB POTENTIAL: Good  CLINICAL DECISION MAKING: Stable/uncomplicated  EVALUATION COMPLEXITY: Low  PLAN: PT FREQUENCY: 2x/week  PT DURATION: 6 weeks  PLANNED INTERVENTIONS: Therapeutic exercises, Therapeutic activity, Neuromuscular  re-education, Balance training, Gait training, Patient/Family education, Joint manipulation, Joint mobilization, Stair training, Orthotic/Fit training, DME instructions, Aquatic Therapy, Dry Needling, Electrical stimulation, Spinal manipulation, Spinal mobilization, Cryotherapy, Moist heat, Compression bandaging, scar mobilization, Splintting, Taping, Traction, Ultrasound, Ionotophoresis 4mg /ml Dexamethasone, and Manual therapy   PLAN FOR NEXT SESSION: debride and dressing changes PRN   Wyman Songster, PT 01/19/2023, 2:31 PM

## 2023-01-20 ENCOUNTER — Encounter: Payer: Self-pay | Admitting: Emergency Medicine

## 2023-01-20 ENCOUNTER — Ambulatory Visit
Admission: EM | Admit: 2023-01-20 | Discharge: 2023-01-20 | Disposition: A | Discharge status: Home / Self Care | Payer: Medicare PPO | Attending: Family Medicine | Admitting: Family Medicine

## 2023-01-20 DIAGNOSIS — L509 Urticaria, unspecified: Secondary | ICD-10-CM | POA: Diagnosis not present

## 2023-01-20 MED ORDER — METHYLPREDNISOLONE SODIUM SUCC 125 MG IJ SOLR
80.0000 mg | Freq: Once | INTRAMUSCULAR | Status: AC
  Administered 2023-01-20: 80 mg via INTRAMUSCULAR

## 2023-01-20 NOTE — Discharge Instructions (Signed)
We have given you a steroid shot today to help take away the allergic rash that you have.  You may also take a Zyrtec or Claritin daily to help further

## 2023-01-20 NOTE — ED Provider Notes (Signed)
[facility]    CSN: [id number] Arrival date & time: 01/20/23  1816      History   Chief Complaint No chief complaint on file.   HPI Jody Hernandez is a 72 y.o. female.   Patient presenting today with 4-day history of itchy rash to bilateral upper legs, left flank.  Denies any new exposures, foods, medications other than has been taking some antibiotics for a left lower extremity wound, though has never had a reaction to medications before.  States she had already been on those medications long before the rash started and does not feel that they are related.  So far tried a Benadryl last night with no relief.    Past Medical History:  Diagnosis Date   Hypertension     Patient Active Problem List   Diagnosis Date Noted   Sepsis (HCC) 03/09/2017   Ureteral stone with hydronephrosis    Hypertension 03/08/2017   Sepsis secondary to UTI (HCC) 03/08/2017   Urinary tract obstruction by kidney stone 03/08/2017    Past Surgical History:  Procedure Laterality Date   BREAST SURGERY     lumpectomy   CYSTOSCOPY WITH STENT PLACEMENT Left 03/09/2017   Procedure: CYSTOSCOPY, RETROGRADE PYELOGRAM WITH STENT PLACEMENT;  Surgeon: Alfredo Martinez, MD;  Location: [facility];  Service: Urology;  Laterality: Left;    OB History   No obstetric history on file.      Home Medications    Prior to Admission medications   Medication Sig Start Date End Date Taking? Authorizing Provider  lisinopril (PRINIVIL,ZESTRIL) 20 MG tablet Take 20 mg by mouth daily.    [provider]    Family History Family History  Problem Relation Age of Onset   Kidney cancer Neg Hx     Social History Social History   Tobacco Use   Smoking status: Never   Smokeless tobacco: Never  Vaping Use   Vaping status: Never Used  Substance Use Topics   Alcohol use: No   Drug use: No     Allergies   Patient has no known allergies.   Review of Systems Review of Systems PER  HPI  Physical Exam Triage Vital Signs ED Triage Vitals  Encounter Vitals Group     BP 01/20/23 1831 (!) 147/92     Systolic BP Percentile --      Diastolic BP Percentile --      Pulse Rate 01/20/23 1831 77     Resp 01/20/23 1831 18     Temp 01/20/23 1831 98.8 F (37.1 C)     Temp Source 01/20/23 1831 Oral     SpO2 01/20/23 1831 98 %     Weight --      Height --      Head Circumference --      Peak Flow --      Pain Score 01/20/23 1833 0     Pain Loc --      Pain Education --      Exclude from Growth Chart --    No data found.  Updated Vital Signs BP (!) 147/92 (BP Location: Right Arm)   Pulse 77   Temp 98.8 F (37.1 C) (Oral)   Resp 18   SpO2 98%   Visual Acuity Right Eye Distance:   Left Eye Distance:   Bilateral Distance:    Right Eye Near:   Left Eye Near:    Bilateral Near:     Physical Exam Vitals and nursing note  reviewed.  Constitutional:      Appearance: Normal appearance. She is not ill-appearing.  HENT:     Head: Atraumatic.     Mouth/Throat:     Mouth: Mucous membranes are moist.     Pharynx: Oropharynx is clear.  Eyes:     Extraocular Movements: Extraocular movements intact.     Conjunctiva/sclera: Conjunctivae normal.  Cardiovascular:     Rate and Rhythm: Normal rate and regular rhythm.     Heart sounds: Normal heart sounds.  Pulmonary:     Effort: Pulmonary effort is normal.     Breath sounds: Normal breath sounds. No wheezing or rales.  Musculoskeletal:        General: Normal range of motion.     Cervical back: Normal range of motion and neck supple.  Skin:    General: Skin is warm and dry.     Findings: Rash present.     Comments: Large areas of urticarial maculopapular rashes to bilateral upper legs, left flank  Neurological:     Mental Status: She is alert and oriented to person, place, and time.  Psychiatric:        Mood and Affect: Mood normal.        Thought Content: Thought content normal.        Judgment: Judgment normal.       UC Treatments / Results  Labs (all labs ordered are listed, but only abnormal results are displayed) Labs Reviewed - No data to display  EKG   Radiology No results found.  Procedures Procedures (including critical care time)  Medications Ordered in UC Medications  methylPREDNISolone sodium succinate (SOLU-MEDROL) 125 mg/2 mL injection 80 mg (80 mg Intramuscular Given 01/20/23 1900)    Initial Impression / Assessment and Plan / UC Course  I have reviewed the triage vital signs and the nursing notes.  Pertinent labs & imaging results that were available during my care of the patient were reviewed by me and considered in my medical decision making (see chart for details).     Unclear etiology of hives, treat with IM Solu-Medrol, antihistamines, close follow-up if worsening or not resolving.  Final Clinical Impressions(s) / UC Diagnoses   Final diagnoses:  Hives     Discharge Instructions      We have given you a steroid shot today to help take away the allergic rash that you have.  You may also take a Zyrtec or Claritin daily to help further    ED Prescriptions   None    PDMP not reviewed this encounter.   Particia Nearing, [place] 01/20/23 1953

## 2023-01-20 NOTE — ED Triage Notes (Signed)
Rash on both upper legs and left side that itches at times x 3 to 4 days.

## 2023-01-23 ENCOUNTER — Ambulatory Visit (HOSPITAL_COMMUNITY): Payer: Medicare PPO | Admitting: Physical Therapy

## 2023-01-23 DIAGNOSIS — I872 Venous insufficiency (chronic) (peripheral): Secondary | ICD-10-CM | POA: Diagnosis not present

## 2023-01-23 DIAGNOSIS — R2689 Other abnormalities of gait and mobility: Secondary | ICD-10-CM

## 2023-01-23 NOTE — Therapy (Signed)
OUTPATIENT PHYSICAL THERAPY WOUND EVALUATION   Patient Name: Jody Hernandez MRN: [id number] DOB:1951/03/02, 72 y.o., female Today's Date: 01/23/2023   PCP: Elfredia Nevins, MD REFERRING PROVIDER: Elfredia Nevins, MD  END OF SESSION:  PT End of Session - 01/23/23 1200     Visit Number 2    Number of Visits 12    Date for PT Re-Evaluation 03/02/23    Authorization Type [employer]    Authorization Time Period 12 visits requested -12 8/22/thru 10/3    Authorization - Visit Number 2    Authorization - Number of Visits 12    Progress Note Due on Visit 10    PT Start Time 1130    PT Stop Time 1200    PT Time Calculation (min) 30 min    Activity Tolerance Patient tolerated treatment well    Behavior During Therapy WFL for tasks assessed/performed             Past Medical History:  Diagnosis Date   Hypertension    Past Surgical History:  Procedure Laterality Date   BREAST SURGERY     lumpectomy   CYSTOSCOPY WITH STENT PLACEMENT Left 03/09/2017   Procedure: CYSTOSCOPY, RETROGRADE PYELOGRAM WITH STENT PLACEMENT;  Surgeon: Alfredo Martinez, MD;  Location: [facility];  Service: Urology;  Laterality: Left;   Patient Active Problem List   Diagnosis Date Noted   Sepsis (HCC) 03/09/2017   Ureteral stone with hydronephrosis    Hypertension 03/08/2017   Sepsis secondary to UTI (HCC) 03/08/2017   Urinary tract obstruction by kidney stone 03/08/2017    ONSET DATE: 1 month approx.  REFERRING DIAG: L03.119 (ICD-10-CM) - Cellulitis of unspecified part of limb I83.009 (ICD-10-CM) - Varicose veins of unspecified lower extremity with ulcer of unspecified site I87.2 (ICD-10-CM) - Venous insufficiency (chronic) (peripheral)  THERAPY DIAG:  Other abnormalities of gait and mobility  Peripheral venous insufficiency  Rationale for Evaluation and Treatment: Rehabilitation     Wound Therapy - 01/23/23 0001     Subjective Patient has had wound for past month, been on 2 antibiotics.  Has not been wearing compression garments.    Patient and Family Stated Goals wound to heal    Date of Onset 12/19/22    Prior Treatments antibiotics    Pain Scale --   with debridement   Faces Pain Scale Hurts even more   with debridement   Evaluation and Treatment Procedures Explained to Patient/Family Yes    Evaluation and Treatment Procedures agreed to    Wound Properties Date First Assessed: 01/19/23 Time First Assessed: 1345 Wound Type: Venous stasis ulcer Location: Pretibial Location Orientation: Left Wound Description (Comments): anterior tibial wound Present on Admission: Yes   Dressing Type Gauze (Comment)    Dressing Status Old drainage    Dressing Change Frequency PRN    Site / Wound Assessment Yellow;Red;Painful    % Wound base Red or Granulating 35%    % Wound base Yellow/Fibrinous Exudate 65%    Peri-wound Assessment Erythema (non-blanchable)    Drainage Amount Minimal    Drainage Description Serous    Treatment Cleansed;Debridement (Selective)    Selective Debridement (non-excisional) - Location anterior tibial region wound    Selective Debridement (non-excisional) - Tools Used Forceps;Scalpel    Selective Debridement (non-excisional) - Tissue Removed slough    Wound Therapy - Clinical Statement see below    Wound Therapy - Functional Problem List bathing    Factors Delaying/Impairing Wound Healing Vascular compromise    Hydrotherapy Plan Debridement;Dressing  change;Electrical stimulation;Patient/family education;Pulsatile lavage with suction;Ultrasonic wound therapy @35  KHz (+/- 3 KHz)    Wound Therapy - Frequency 2X / week    Wound Therapy - Current Recommendations PT    Wound Plan debride and dressing changes PRN    Dressing  vaseline to LLE, medihoney, 4x4, abd for shaping, profore lite               PATIENT EDUCATION: Education details: Patient educated on exam findings, POC, scope of PT, dressing changes if soiled. Person educated: Patient Education  method: Explanation Education comprehension: verbalized understanding   HOME EXERCISE PROGRAM:    GOALS: Goals reviewed with patient? Yes  SHORT TERM GOALS: Target date: 02/09/2023    Wound to decrease in size by half to demonstrate wound healing. Baseline: Goal status: INITIAL  2.  Wound to be free from slough.  Baseline:  Goal status: INITIAL    LONG TERM GOALS: Target date: 03/02/2023    Wound to be healed to reduce risk of infection Baseline:  Goal status: INITIAL   ASSESSMENT:  CLINICAL IMPRESSION: Eschar has thinned.   Able to debride today but debridement is limited due to pt pain.  Pt continues to have an anterior tibial region wound with erythema and edema present and will benefit from skilled PT for debridement.   [name] with medhoney followed by profore for compression to reduce edema. Patient will benefit from skilled physical therapy in order to promote wound healing.   OBJECTIVE IMPAIRMENTS: Abnormal gait, decreased activity tolerance, decreased balance, decreased endurance, increased edema, improper body mechanics, and pain.   ACTIVITY LIMITATIONS: bending, standing, squatting, bathing, hygiene/grooming, locomotion level, and caring for others  PARTICIPATION LIMITATIONS: meal prep, cleaning, laundry, shopping, community activity, and yard work  PERSONAL FACTORS: Fitness, Past/current experiences, Time since onset of injury/illness/exacerbation, and 3+ comorbidities: varicose veins, HTN , venous insufficiency  are also affecting patient's functional outcome.   REHAB POTENTIAL: Good  CLINICAL DECISION MAKING: Stable/uncomplicated  EVALUATION COMPLEXITY: Low  PLAN: PT FREQUENCY: 2x/week  PT DURATION: 6 weeks  PLANNED INTERVENTIONS: Therapeutic exercises, Therapeutic activity, Neuromuscular re-education, Balance training, Gait training, Patient/Family education, Joint manipulation, Joint mobilization, Stair training, Orthotic/Fit training, DME  instructions, Aquatic Therapy, Dry Needling, Electrical stimulation, Spinal manipulation, Spinal mobilization, Cryotherapy, Moist heat, Compression bandaging, scar mobilization, Splintting, Taping, Traction, Ultrasound, Ionotophoresis 4mg /ml Dexamethasone, and Manual therapy   PLAN FOR NEXT SESSION: debride and dressing changes PRN  Virgina Organ, PT CLT [phone] 01/23/2023, 12:03 PM

## 2023-01-26 ENCOUNTER — Encounter (HOSPITAL_COMMUNITY): Payer: Self-pay

## 2023-01-26 ENCOUNTER — Ambulatory Visit (HOSPITAL_COMMUNITY): Payer: Medicare PPO

## 2023-01-26 DIAGNOSIS — I872 Venous insufficiency (chronic) (peripheral): Secondary | ICD-10-CM

## 2023-01-26 DIAGNOSIS — R2689 Other abnormalities of gait and mobility: Secondary | ICD-10-CM | POA: Diagnosis not present

## 2023-01-26 NOTE — Therapy (Signed)
OUTPATIENT PHYSICAL THERAPY WOUND TREATMENT   Patient Name: Jody Hernandez MRN: [id number] DOB:April 24, 1951, 72 y.o., female Today's Date: 01/26/2023   PCP: Elfredia Nevins, MD REFERRING PROVIDER: Elfredia Nevins, MD  END OF SESSION:  PT End of Session - 01/26/23 1541     Visit Number 3    Number of Visits 12    Date for PT Re-Evaluation 03/02/23    Authorization Type [employer]    Authorization Time Period 12 visits requested -12 8/22/thru 10/3    Authorization - Visit Number 3    Authorization - Number of Visits 12    Progress Note Due on Visit 10    PT Start Time 1449    PT Stop Time 1530    PT Time Calculation (min) 41 min    Activity Tolerance Patient tolerated treatment well    Behavior During Therapy WFL for tasks assessed/performed              Past Medical History:  Diagnosis Date   Hypertension    Past Surgical History:  Procedure Laterality Date   BREAST SURGERY     lumpectomy   CYSTOSCOPY WITH STENT PLACEMENT Left 03/09/2017   Procedure: CYSTOSCOPY, RETROGRADE PYELOGRAM WITH STENT PLACEMENT;  Surgeon: Alfredo Martinez, MD;  Location: [facility];  Service: Urology;  Laterality: Left;   Patient Active Problem List   Diagnosis Date Noted   Sepsis (HCC) 03/09/2017   Ureteral stone with hydronephrosis    Hypertension 03/08/2017   Sepsis secondary to UTI (HCC) 03/08/2017   Urinary tract obstruction by kidney stone 03/08/2017    ONSET DATE: 1 month approx.  REFERRING DIAG: L03.119 (ICD-10-CM) - Cellulitis of unspecified part of limb I83.009 (ICD-10-CM) - Varicose veins of unspecified lower extremity with ulcer of unspecified site I87.2 (ICD-10-CM) - Venous insufficiency (chronic) (peripheral)  THERAPY DIAG:  Other abnormalities of gait and mobility  Peripheral venous insufficiency  Rationale for Evaluation and Treatment: Rehabilitation    01/26/23 0001  Subjective Assessment  Subjective Pt arrived with dressings intact, stated she took some OTC  medication this morning.  Patient and Family Stated Goals wound to heal  Date of Onset 12/19/22  Prior Treatments antibiotics  Pain Assessment  Pain Scale 0-10  Faces Pain Scale 4  Evaluation and Treatment  Evaluation and Treatment Procedures Explained to Patient/Family Yes  Evaluation and Treatment Procedures agreed to  Wound / Incision (Open or Dehisced) 01/19/23 Venous stasis ulcer Pretibial Left anterior tibial wound  Date First Assessed/Time First Assessed: 01/19/23 1345   Wound Type: Venous stasis ulcer  Location: Pretibial  Location Orientation: Left  Wound Description (Comments): anterior tibial wound  Present on Admission: Yes  Dressing Type Gauze (Comment);Compression wrap (Medihoney, 2x2, profore lite with netting #5)  Dressing Changed Changed  Dressing Status Old drainage  Dressing Change Frequency PRN  Site / Wound Assessment Yellow;Red;Painful  % Wound base Red or Granulating 35%  % Wound base Yellow/Fibrinous Exudate 65%  Peri-wound Assessment Erythema (non-blanchable)  Drainage Amount Minimal  Drainage Description Serous  Treatment Cleansed;Debridement (Selective)  Selective Debridement (non-excisional)  Selective Debridement (non-excisional) - Location anterior tibial region wound  Selective Debridement (non-excisional) - Tools Used Forceps;Scalpel  Selective Debridement (non-excisional) - Tissue Removed slough  Wound Therapy - Assess/Plan/Recommendations  Wound Therapy - Clinical Statement see below  Wound Therapy - Functional Problem List bathing  Factors Delaying/Impairing Wound Healing Vascular compromise  Hydrotherapy Plan Debridement;Dressing change;Electrical stimulation;Patient/family education;Pulsatile lavage with suction;Ultrasonic wound therapy @35  KHz (+/- 3 KHz)  Wound Therapy -  Frequency 2X / week  Wound Therapy - Current Recommendations PT  Wound Plan debride and dressing changes PRN  Wound Therapy  Dressing  vaseline to LLE, medihoney, 4x4, abd  for shaping, profore lite         PATIENT EDUCATION: Education details: Patient educated on exam findings, POC, scope of PT, dressing changes if soiled. Person educated: Patient Education method: Explanation Education comprehension: verbalized understanding   HOME EXERCISE PROGRAM:    GOALS: Goals reviewed with patient? Yes  SHORT TERM GOALS: Target date: 02/09/2023    Wound to decrease in size by half to demonstrate wound healing. Baseline: Goal status: INITIAL  2.  Wound to be free from slough.  Baseline:  Goal status: INITIAL    LONG TERM GOALS: Target date: 03/02/2023    Wound to be healed to reduce risk of infection Baseline:  Goal status: INITIAL   ASSESSMENT:  CLINICAL IMPRESSION: Selective debridement complete for removal of adherent yellow eschar/slough from wound bed to promote healing.  Pt limited by pain which limited ability to remove more, give squeeze ball with reports of improved tolerance.  Continued with medihoney and profore lite for edema control.  Pt will benefit from compression garments following DC, plan to measure shortly that way pt will have garment when ready.  Pt will continue to benefit from skilled PT to promote wound healing.    OBJECTIVE IMPAIRMENTS: Abnormal gait, decreased activity tolerance, decreased balance, decreased endurance, increased edema, improper body mechanics, and pain.   ACTIVITY LIMITATIONS: bending, standing, squatting, bathing, hygiene/grooming, locomotion level, and caring for others  PARTICIPATION LIMITATIONS: meal prep, cleaning, laundry, shopping, community activity, and yard work  PERSONAL FACTORS: Fitness, Past/current experiences, Time since onset of injury/illness/exacerbation, and 3+ comorbidities: varicose veins, HTN , venous insufficiency  are also affecting patient's functional outcome.   REHAB POTENTIAL: Good  CLINICAL DECISION MAKING: Stable/uncomplicated  EVALUATION COMPLEXITY:  Low  PLAN: PT FREQUENCY: 2x/week  PT DURATION: 6 weeks  PLANNED INTERVENTIONS: Therapeutic exercises, Therapeutic activity, Neuromuscular re-education, Balance training, Gait training, Patient/Family education, Joint manipulation, Joint mobilization, Stair training, Orthotic/Fit training, DME instructions, Aquatic Therapy, Dry Needling, Electrical stimulation, Spinal manipulation, Spinal mobilization, Cryotherapy, Moist heat, Compression bandaging, scar mobilization, Splintting, Taping, Traction, Ultrasound, Ionotophoresis 4mg /ml Dexamethasone, and Manual therapy   PLAN FOR NEXT SESSION: debride and dressing changes PRN.  Measure/photo weekly.  Take measurements and give ETI handout for compression garments when appropriate.  Becky Sax, LPTA/CLT; CBIS [phone]  Juel Burrow, PTA 01/26/2023, 3:49 PM  01/26/2023, 3:45 PM

## 2023-02-01 ENCOUNTER — Ambulatory Visit (HOSPITAL_COMMUNITY): Payer: Medicare PPO | Attending: Internal Medicine

## 2023-02-01 ENCOUNTER — Encounter (HOSPITAL_COMMUNITY): Payer: Self-pay

## 2023-02-01 DIAGNOSIS — R2689 Other abnormalities of gait and mobility: Secondary | ICD-10-CM | POA: Insufficient documentation

## 2023-02-01 DIAGNOSIS — I872 Venous insufficiency (chronic) (peripheral): Secondary | ICD-10-CM | POA: Insufficient documentation

## 2023-02-01 NOTE — Therapy (Signed)
OUTPATIENT PHYSICAL THERAPY WOUND TREATMENT   Patient Name: Jody Hernandez MRN: [id number] DOB:24-Nov-1950, 72 y.o., female Today's Date: 02/01/2023   PCP: Elfredia Nevins, MD REFERRING PROVIDER: Elfredia Nevins, MD  END OF SESSION:  PT End of Session - 02/01/23 0901     Visit Number 4    Number of Visits 12    Date for PT Re-Evaluation 03/02/23    Authorization Type [employer]    Authorization Time Period 12 visits requested -12 8/22/thru 10/3    Authorization - Visit Number 4    Authorization - Number of Visits 12    Progress Note Due on Visit 10    PT Start Time 0820    PT Stop Time 0855    PT Time Calculation (min) 35 min    Activity Tolerance Patient tolerated treatment well    Behavior During Therapy WFL for tasks assessed/performed              Past Medical History:  Diagnosis Date   Hypertension    Past Surgical History:  Procedure Laterality Date   BREAST SURGERY     lumpectomy   CYSTOSCOPY WITH STENT PLACEMENT Left 03/09/2017   Procedure: CYSTOSCOPY, RETROGRADE PYELOGRAM WITH STENT PLACEMENT;  Surgeon: Alfredo Martinez, MD;  Location: [facility];  Service: Urology;  Laterality: Left;   Patient Active Problem List   Diagnosis Date Noted   Sepsis (HCC) 03/09/2017   Ureteral stone with hydronephrosis    Hypertension 03/08/2017   Sepsis secondary to UTI (HCC) 03/08/2017   Urinary tract obstruction by kidney stone 03/08/2017    ONSET DATE: 1 month approx.  REFERRING DIAG: L03.119 (ICD-10-CM) - Cellulitis of unspecified part of limb I83.009 (ICD-10-CM) - Varicose veins of unspecified lower extremity with ulcer of unspecified site I87.2 (ICD-10-CM) - Venous insufficiency (chronic) (peripheral)  THERAPY DIAG:  Other abnormalities of gait and mobility  Peripheral venous insufficiency  Rationale for Evaluation and Treatment: Rehabilitation    01/26/23 0001  Subjective Assessment  Subjective Pt arrived with dressings intact, stated she took some OTC  medication this morning.  Patient and Family Stated Goals wound to heal  Date of Onset 12/19/22  Prior Treatments antibiotics  Pain Assessment  Pain Scale 0-10  Faces Pain Scale 4  Evaluation and Treatment  Evaluation and Treatment Procedures Explained to Patient/Family Yes  Evaluation and Treatment Procedures agreed to  Wound / Incision (Open or Dehisced) 01/19/23 Venous stasis ulcer Pretibial Left anterior tibial wound  Date First Assessed/Time First Assessed: 01/19/23 1345   Wound Type: Venous stasis ulcer  Location: Pretibial  Location Orientation: Left  Wound Description (Comments): anterior tibial wound  Present on Admission: Yes  Dressing Type Gauze (Comment);Compression wrap (Medihoney, 2x2, profore lite with netting #5)  Dressing Changed Changed  Dressing Status Old drainage  Dressing Change Frequency PRN  Site / Wound Assessment Yellow;Red;Painful  % Wound base Red or Granulating 35%  % Wound base Yellow/Fibrinous Exudate 65%  Peri-wound Assessment Erythema (non-blanchable)  Drainage Amount Minimal  Drainage Description Serous  Treatment Cleansed;Debridement (Selective)  Selective Debridement (non-excisional)  Selective Debridement (non-excisional) - Location anterior tibial region wound  Selective Debridement (non-excisional) - Tools Used Forceps;Scalpel  Selective Debridement (non-excisional) - Tissue Removed slough  Wound Therapy - Assess/Plan/Recommendations  Wound Therapy - Clinical Statement see below  Wound Therapy - Functional Problem List bathing  Factors Delaying/Impairing Wound Healing Vascular compromise  Hydrotherapy Plan Debridement;Dressing change;Electrical stimulation;Patient/family education;Pulsatile lavage with suction;Ultrasonic wound therapy @35  KHz (+/- 3 KHz)  Wound Therapy -  Frequency 2X / week  Wound Therapy - Current Recommendations PT  Wound Plan debride and dressing changes PRN  Wound Therapy  Dressing  vaseline to LLE, medihoney, 4x4, abd  for shaping, profore lite    Wound Therapy - 02/01/23 0001     Subjective Pt arrived wiht dressings intact, reports soreness today.    Patient and Family Stated Goals wound to heal    Date of Onset 12/19/22    Prior Treatments antibiotics    Pain Scale 0-10    Faces Pain Scale Hurts even more    Evaluation and Treatment Procedures Explained to Patient/Family Yes    Evaluation and Treatment Procedures agreed to    Wound Properties Date First Assessed: 01/19/23 Time First Assessed: 1345 Wound Type: Venous stasis ulcer Location: Pretibial Location Orientation: Left Wound Description (Comments): anterior tibial wound Present on Admission: Yes   Wound Image Images linked: 1    Dressing Type Gauze (Comment);Compression wrap   vaseline to LLE, medihoney, 4x4, abd for shaping, profore lite   Dressing Changed Changed    Dressing Status Old drainage    Dressing Change Frequency PRN    Site / Wound Assessment Yellow;Red;Painful    % Wound base Red or Granulating 40%    % Wound base Yellow/Fibrinous Exudate 60%    Peri-wound Assessment Erythema (non-blanchable)    Wound Length (cm) 4 cm    Wound Width (cm) 2.9 cm    Wound Surface Area (cm^2) 11.6 cm^2    Drainage Amount Minimal    Drainage Description Serous    Treatment Cleansed;Debridement (Selective)    Selective Debridement (non-excisional) - Location anterior tibial region wound    Selective Debridement (non-excisional) - Tools Used Forceps;Scalpel    Selective Debridement (non-excisional) - Tissue Removed slough    Wound Therapy - Clinical Statement see below    Wound Therapy - Functional Problem List bathing    Factors Delaying/Impairing Wound Healing Vascular compromise    Hydrotherapy Plan Debridement;Dressing change;Electrical stimulation;Patient/family education;Pulsatile lavage with suction;Ultrasonic wound therapy @35  KHz (+/- 3 KHz)    Wound Therapy - Frequency 2X / week    Wound Therapy - Current Recommendations PT    Wound  Plan debride and dressing changes PRN    Dressing  vaseline to LLE, medihoney, 4x4, abd for shaping, profore lite              Wound Therapy - 02/01/23 0001     Subjective Pt arrived wiht dressings intact, reports soreness today.    Patient and Family Stated Goals wound to heal    Date of Onset 12/19/22    Prior Treatments antibiotics    Pain Scale 0-10    Faces Pain Scale Hurts even more    Evaluation and Treatment Procedures Explained to Patient/Family Yes    Evaluation and Treatment Procedures agreed to    Wound Properties Date First Assessed: 01/19/23 Time First Assessed: 1345 Wound Type: Venous stasis ulcer Location: Pretibial Location Orientation: Left Wound Description (Comments): anterior tibial wound Present on Admission: Yes   Wound Image Images linked: 1    Dressing Type Gauze (Comment);Compression wrap   vaseline to LLE, medihoney, 4x4, abd for shaping, profore lite   Dressing Changed Changed    Dressing Status Old drainage    Dressing Change Frequency PRN    Site / Wound Assessment Yellow;Red;Painful    % Wound base Red or Granulating 40%    % Wound base Yellow/Fibrinous Exudate 60%    Peri-wound Assessment Erythema (non-blanchable)  Wound Length (cm) 4 cm    Wound Width (cm) 2.9 cm    Wound Surface Area (cm^2) 11.6 cm^2    Drainage Amount Minimal    Drainage Description Serous    Treatment Cleansed;Debridement (Selective)    Selective Debridement (non-excisional) - Location anterior tibial region wound    Selective Debridement (non-excisional) - Tools Used Forceps;Scalpel    Selective Debridement (non-excisional) - Tissue Removed slough    Wound Therapy - Clinical Statement see below    Wound Therapy - Functional Problem List bathing    Factors Delaying/Impairing Wound Healing Vascular compromise    Hydrotherapy Plan Debridement;Dressing change;Electrical stimulation;Patient/family education;Pulsatile lavage with suction;Ultrasonic wound therapy @35  KHz (+/- 3  KHz)    Wound Therapy - Frequency 2X / week    Wound Therapy - Current Recommendations PT    Wound Plan debride and dressing changes PRN    Dressing  vaseline to LLE, medihoney, 4x4, abd for shaping, profore lite                PATIENT EDUCATION: Education details: Patient educated on exam findings, POC, scope of PT, dressing changes if soiled. Person educated: Patient Education method: Explanation Education comprehension: verbalized understanding   HOME EXERCISE PROGRAM:    GOALS: Goals reviewed with patient? Yes  SHORT TERM GOALS: Target date: 02/09/2023    Wound to decrease in size by half to demonstrate wound healing. Baseline: Goal status: INITIAL  2.  Wound to be free from slough.  Baseline:  Goal status: INITIAL    LONG TERM GOALS: Target date: 03/02/2023    Wound to be healed to reduce risk of infection Baseline:  Goal status: INITIAL   ASSESSMENT:  CLINICAL IMPRESSION: Yellow eschar continues to be adherent though was able to remove top layer with scalpel and forceps.  PT was limited by pain, limiting ability for further selective debridement.  Given emotional support, football to squeeze and breaks through session. Continued with medihoney and profore life for edema control with reports of comfort.    OBJECTIVE IMPAIRMENTS: Abnormal gait, decreased activity tolerance, decreased balance, decreased endurance, increased edema, improper body mechanics, and pain.   ACTIVITY LIMITATIONS: bending, standing, squatting, bathing, hygiene/grooming, locomotion level, and caring for others  PARTICIPATION LIMITATIONS: meal prep, cleaning, laundry, shopping, community activity, and yard work  PERSONAL FACTORS: Fitness, Past/current experiences, Time since onset of injury/illness/exacerbation, and 3+ comorbidities: varicose veins, HTN , venous insufficiency  are also affecting patient's functional outcome.   REHAB POTENTIAL: Good  CLINICAL DECISION MAKING:  Stable/uncomplicated  EVALUATION COMPLEXITY: Low  PLAN: PT FREQUENCY: 2x/week  PT DURATION: 6 weeks  PLANNED INTERVENTIONS: Therapeutic exercises, Therapeutic activity, Neuromuscular re-education, Balance training, Gait training, Patient/Family education, Joint manipulation, Joint mobilization, Stair training, Orthotic/Fit training, DME instructions, Aquatic Therapy, Dry Needling, Electrical stimulation, Spinal manipulation, Spinal mobilization, Cryotherapy, Moist heat, Compression bandaging, scar mobilization, Splintting, Taping, Traction, Ultrasound, Ionotophoresis 4mg /ml Dexamethasone, and Manual therapy   PLAN FOR NEXT SESSION: debride and dressing changes PRN.  Measure/photo weekly.  Take measurements and give ETI handout for compression garments when appropriate.  Becky Sax, LPTA/CLT; CBIS [phone]  Juel Burrow, PTA 02/01/2023, 9:02 AM  02/01/2023, 9:02 AM

## 2023-02-06 ENCOUNTER — Encounter (HOSPITAL_COMMUNITY): Payer: Self-pay

## 2023-02-06 ENCOUNTER — Ambulatory Visit (HOSPITAL_COMMUNITY): Payer: Medicare PPO | Admitting: Physical Therapy

## 2023-02-06 ENCOUNTER — Ambulatory Visit (HOSPITAL_COMMUNITY): Payer: Medicare PPO

## 2023-02-06 DIAGNOSIS — R2689 Other abnormalities of gait and mobility: Secondary | ICD-10-CM

## 2023-02-06 DIAGNOSIS — I872 Venous insufficiency (chronic) (peripheral): Secondary | ICD-10-CM

## 2023-02-06 NOTE — Therapy (Signed)
OUTPATIENT PHYSICAL THERAPY WOUND TREATMENT   Patient Name: Jody Hernandez MRN: [id number] DOB:July 18, 1950, 72 y.o., female Today's Date: 02/06/2023   PCP: Elfredia Nevins, MD REFERRING PROVIDER: Elfredia Nevins, MD  END OF SESSION:  PT End of Session - 02/06/23 0910     Visit Number 5    Number of Visits 12    Date for PT Re-Evaluation 03/02/23    Authorization Type [employer]    Authorization Time Period 12 visits requested -12 8/22/thru 10/3    Authorization - Visit Number 5    Authorization - Number of Visits 12    Progress Note Due on Visit 10    PT Start Time 0825    PT Stop Time 0900    PT Time Calculation (min) 35 min    Activity Tolerance Patient tolerated treatment well    Behavior During Therapy WFL for tasks assessed/performed              Past Medical History:  Diagnosis Date   Hypertension    Past Surgical History:  Procedure Laterality Date   BREAST SURGERY     lumpectomy   CYSTOSCOPY WITH STENT PLACEMENT Left 03/09/2017   Procedure: CYSTOSCOPY, RETROGRADE PYELOGRAM WITH STENT PLACEMENT;  Surgeon: Alfredo Martinez, MD;  Location: [facility];  Service: Urology;  Laterality: Left;   Patient Active Problem List   Diagnosis Date Noted   Sepsis (HCC) 03/09/2017   Ureteral stone with hydronephrosis    Hypertension 03/08/2017   Sepsis secondary to UTI (HCC) 03/08/2017   Urinary tract obstruction by kidney stone 03/08/2017    ONSET DATE: 1 month approx.  REFERRING DIAG: L03.119 (ICD-10-CM) - Cellulitis of unspecified part of limb I83.009 (ICD-10-CM) - Varicose veins of unspecified lower extremity with ulcer of unspecified site I87.2 (ICD-10-CM) - Venous insufficiency (chronic) (peripheral)  THERAPY DIAG:  Other abnormalities of gait and mobility  Peripheral venous insufficiency  Rationale for Evaluation and Treatment: Rehabilitation     Wound Therapy - 02/06/23 0001     Subjective Pt arrived with dressings intact, stated pain scale 2-3/10.     Patient and Family Stated Goals wound to heal    Date of Onset 12/19/22    Prior Treatments antibiotics    Pain Scale 0-10    Pain Score 2     Pain Location Leg    Pain Orientation Left    Evaluation and Treatment Procedures Explained to Patient/Family Yes    Evaluation and Treatment Procedures agreed to    Wound Properties Date First Assessed: 01/19/23 Time First Assessed: 1345 Wound Type: Venous stasis ulcer Location: Pretibial Location Orientation: Left Wound Description (Comments): anterior tibial wound Present on Admission: Yes   Dressing Type Gauze (Comment);Compression wrap   vaseline to LLE, medihoney, 4x4, abd for shaping, profore lite   Dressing Changed Changed    Dressing Status Old drainage    Dressing Change Frequency PRN    Site / Wound Assessment Yellow;Red;Painful    % Wound base Red or Granulating 45%    % Wound base Yellow/Fibrinous Exudate 55%    Peri-wound Assessment Erythema (non-blanchable)    Wound Length (cm) 3.5 cm    Wound Width (cm) 2.9 cm    Wound Surface Area (cm^2) 10.15 cm^2    Drainage Amount Minimal    Drainage Description Serous    Treatment Cleansed;Debridement (Selective)    Selective Debridement (non-excisional) - Location anterior tibial region wound    Selective Debridement (non-excisional) - Tools Used Forceps;Scalpel    Selective Debridement (  non-excisional) - Tissue Removed slough    Wound Therapy - Clinical Statement see below    Wound Therapy - Functional Problem List bathing    Factors Delaying/Impairing Wound Healing Vascular compromise    Hydrotherapy Plan Debridement;Dressing change;Electrical stimulation;Patient/family education;Pulsatile lavage with suction;Ultrasonic wound therapy @35  KHz (+/- 3 KHz)    Wound Therapy - Frequency 2X / week    Wound Therapy - Current Recommendations PT    Wound Plan debride and dressing changes PRN    Dressing  vaseline to LLE, medihoney, 4x4, abd for shaping, profore lite              Wound  Therapy - 02/06/23 0001     Subjective Pt arrived with dressings intact, stated pain scale 2-3/10.    Patient and Family Stated Goals wound to heal    Date of Onset 12/19/22    Prior Treatments antibiotics    Pain Scale 0-10    Pain Score 2     Pain Location Leg    Pain Orientation Left    Evaluation and Treatment Procedures Explained to Patient/Family Yes    Evaluation and Treatment Procedures agreed to    Wound Properties Date First Assessed: 01/19/23 Time First Assessed: 1345 Wound Type: Venous stasis ulcer Location: Pretibial Location Orientation: Left Wound Description (Comments): anterior tibial wound Present on Admission: Yes   Dressing Type Gauze (Comment);Compression wrap   vaseline to LLE, medihoney, 4x4, abd for shaping, profore lite   Dressing Changed Changed    Dressing Status Old drainage    Dressing Change Frequency PRN    Site / Wound Assessment Yellow;Red;Painful    % Wound base Red or Granulating 45%    % Wound base Yellow/Fibrinous Exudate 55%    Peri-wound Assessment Erythema (non-blanchable)    Wound Length (cm) 3.5 cm    Wound Width (cm) 2.9 cm    Wound Surface Area (cm^2) 10.15 cm^2    Drainage Amount Minimal    Drainage Description Serous    Treatment Cleansed;Debridement (Selective)    Selective Debridement (non-excisional) - Location anterior tibial region wound    Selective Debridement (non-excisional) - Tools Used Forceps;Scalpel    Selective Debridement (non-excisional) - Tissue Removed slough    Wound Therapy - Clinical Statement see below    Wound Therapy - Functional Problem List bathing    Factors Delaying/Impairing Wound Healing Vascular compromise    Hydrotherapy Plan Debridement;Dressing change;Electrical stimulation;Patient/family education;Pulsatile lavage with suction;Ultrasonic wound therapy @35  KHz (+/- 3 KHz)    Wound Therapy - Frequency 2X / week    Wound Therapy - Current Recommendations PT    Wound Plan debride and dressing changes PRN     Dressing  vaseline to LLE, medihoney, 4x4, abd for shaping, profore lite                PATIENT EDUCATION: Education details: Patient educated on exam findings, POC, scope of PT, dressing changes if soiled. Person educated: Patient Education method: Explanation Education comprehension: verbalized understanding   HOME EXERCISE PROGRAM:    GOALS: Goals reviewed with patient? Yes  SHORT TERM GOALS: Target date: 02/09/2023    Wound to decrease in size by half to demonstrate wound healing. Baseline: Goal status: INITIAL  2.  Wound to be free from slough.  Baseline:  Goal status: INITIAL    LONG TERM GOALS: Target date: 03/02/2023    Wound to be healed to reduce risk of infection Baseline:  Goal status: INITIAL   ASSESSMENT:  CLINICAL IMPRESSION:  Able to remove superficial layer of slough from wound bed to promote healing.  Eschar continues to be adherent to wound bed.  Continued with medihoney and profore lite for edema control.  Measurements taken for compression garments and pt given handout for ETI.  OBJECTIVE IMPAIRMENTS: Abnormal gait, decreased activity tolerance, decreased balance, decreased endurance, increased edema, improper body mechanics, and pain.   ACTIVITY LIMITATIONS: bending, standing, squatting, bathing, hygiene/grooming, locomotion level, and caring for others  PARTICIPATION LIMITATIONS: meal prep, cleaning, laundry, shopping, community activity, and yard work  PERSONAL FACTORS: Fitness, Past/current experiences, Time since onset of injury/illness/exacerbation, and 3+ comorbidities: varicose veins, HTN , venous insufficiency  are also affecting patient's functional outcome.   REHAB POTENTIAL: Good  CLINICAL DECISION MAKING: Stable/uncomplicated  EVALUATION COMPLEXITY: Low  PLAN: PT FREQUENCY: 2x/week  PT DURATION: 6 weeks  PLANNED INTERVENTIONS: Therapeutic exercises, Therapeutic activity, Neuromuscular re-education, Balance  training, Gait training, Patient/Family education, Joint manipulation, Joint mobilization, Stair training, Orthotic/Fit training, DME instructions, Aquatic Therapy, Dry Needling, Electrical stimulation, Spinal manipulation, Spinal mobilization, Cryotherapy, Moist heat, Compression bandaging, scar mobilization, Splintting, Taping, Traction, Ultrasound, Ionotophoresis 4mg /ml Dexamethasone, and Manual therapy   PLAN FOR NEXT SESSION: debride and dressing changes PRN.  Measure/photo weekly.    Becky Sax, LPTA/CLT; CBIS [phone]  Juel Burrow, PTA 02/06/2023, 9:53 AM  02/06/2023, 9:53 AM

## 2023-02-09 ENCOUNTER — Ambulatory Visit (HOSPITAL_COMMUNITY): Payer: Medicare PPO | Admitting: Physical Therapy

## 2023-02-09 DIAGNOSIS — I872 Venous insufficiency (chronic) (peripheral): Secondary | ICD-10-CM | POA: Diagnosis not present

## 2023-02-09 DIAGNOSIS — R2689 Other abnormalities of gait and mobility: Secondary | ICD-10-CM | POA: Diagnosis not present

## 2023-02-09 NOTE — Therapy (Signed)
OUTPATIENT PHYSICAL THERAPY WOUND TREATMENT   Patient Name: Jody Hernandez MRN: [id number] DOB:1950/08/28, 72 y.o., female Today's Date: 02/09/2023   PCP: Elfredia Nevins, MD REFERRING PROVIDER: Elfredia Nevins, MD  END OF SESSION:  PT End of Session - 02/09/23 1027     Visit Number 6    Number of Visits 12    Date for PT Re-Evaluation 03/02/23    Authorization Type [employer]    Authorization Time Period 12 visits requested -12 8/22/thru 10/3    Authorization - Visit Number 6    Authorization - Number of Visits 12    Progress Note Due on Visit 10    PT Start Time 0950    PT Stop Time 1024    PT Time Calculation (min) 34 min    Activity Tolerance Patient tolerated treatment well    Behavior During Therapy WFL for tasks assessed/performed              Past Medical History:  Diagnosis Date   Hypertension    Past Surgical History:  Procedure Laterality Date   BREAST SURGERY     lumpectomy   CYSTOSCOPY WITH STENT PLACEMENT Left 03/09/2017   Procedure: CYSTOSCOPY, RETROGRADE PYELOGRAM WITH STENT PLACEMENT;  Surgeon: Alfredo Martinez, MD;  Location: [facility];  Service: Urology;  Laterality: Left;   Patient Active Problem List   Diagnosis Date Noted   Sepsis (HCC) 03/09/2017   Ureteral stone with hydronephrosis    Hypertension 03/08/2017   Sepsis secondary to UTI (HCC) 03/08/2017   Urinary tract obstruction by kidney stone 03/08/2017    ONSET DATE: 1 month approx.  REFERRING DIAG: L03.119 (ICD-10-CM) - Cellulitis of unspecified part of limb I83.009 (ICD-10-CM) - Varicose veins of unspecified lower extremity with ulcer of unspecified site I87.2 (ICD-10-CM) - Venous insufficiency (chronic) (peripheral)  THERAPY DIAG:  Other abnormalities of gait and mobility  Peripheral venous insufficiency  Rationale for Evaluation and Treatment: Rehabilitation     Wound Therapy - 02/09/23 1027     Subjective Pt arrived with dressings intact, states it doesnt hurt  unless we work on it.    Patient and Family Stated Goals wound to heal    Date of Onset 12/19/22    Prior Treatments antibiotics    Evaluation and Treatment Procedures Explained to Patient/Family Yes    Evaluation and Treatment Procedures agreed to    Wound Properties Date First Assessed: 01/19/23 Time First Assessed: 1345 Wound Type: Venous stasis ulcer Location: Pretibial Location Orientation: Left Wound Description (Comments): anterior tibial wound Present on Admission: Yes   Wound Image Images linked: 1    Dressing Type Gauze (Comment);Compression wrap   vaseline to LLE, medihoney, 4x4, abd for shaping, profore lite   Dressing Changed Changed    Dressing Status Old drainage    Dressing Change Frequency PRN    Site / Wound Assessment Yellow;Red;Painful    % Wound base Red or Granulating 45%    % Wound base Yellow/Fibrinous Exudate 55%    Peri-wound Assessment Erythema (non-blanchable)    Wound Length (cm) 3.2 cm    Wound Width (cm) 2.6 cm    Wound Surface Area (cm^2) 8.32 cm^2    Drainage Amount Minimal    Drainage Description Serous    Selective Debridement (non-excisional) - Location anterior tibial region wound    Selective Debridement (non-excisional) - Tools Used Forceps;Scalpel    Selective Debridement (non-excisional) - Tissue Removed slough    Wound Therapy - Clinical Statement see below  Wound Therapy - Functional Problem List bathing    Factors Delaying/Impairing Wound Healing Vascular compromise    Hydrotherapy Plan Debridement;Dressing change;Electrical stimulation;Patient/family education;Pulsatile lavage with suction;Ultrasonic wound therapy @35  KHz (+/- 3 KHz)    Wound Therapy - Frequency 2X / week    Wound Therapy - Current Recommendations PT    Wound Plan debride and dressing changes PRN    Dressing  vaseline to LLE, medihoney, 4x4, abd for shaping, profore lite              Wound Therapy - 02/09/23 1027     Subjective Pt arrived with dressings intact,  states it doesnt hurt unless we work on it.    Patient and Family Stated Goals wound to heal    Date of Onset 12/19/22    Prior Treatments antibiotics    Evaluation and Treatment Procedures Explained to Patient/Family Yes    Evaluation and Treatment Procedures agreed to    Wound Properties Date First Assessed: 01/19/23 Time First Assessed: 1345 Wound Type: Venous stasis ulcer Location: Pretibial Location Orientation: Left Wound Description (Comments): anterior tibial wound Present on Admission: Yes   Wound Image Images linked: 1    Dressing Type Gauze (Comment);Compression wrap   vaseline to LLE, medihoney, 4x4, abd for shaping, profore lite   Dressing Changed Changed    Dressing Status Old drainage    Dressing Change Frequency PRN    Site / Wound Assessment Yellow;Red;Painful    % Wound base Red or Granulating 45%    % Wound base Yellow/Fibrinous Exudate 55%    Peri-wound Assessment Erythema (non-blanchable)    Wound Length (cm) 3.2 cm    Wound Width (cm) 2.6 cm    Wound Surface Area (cm^2) 8.32 cm^2    Drainage Amount Minimal    Drainage Description Serous    Selective Debridement (non-excisional) - Location anterior tibial region wound    Selective Debridement (non-excisional) - Tools Used Forceps;Scalpel    Selective Debridement (non-excisional) - Tissue Removed slough    Wound Therapy - Clinical Statement see below    Wound Therapy - Functional Problem List bathing    Factors Delaying/Impairing Wound Healing Vascular compromise    Hydrotherapy Plan Debridement;Dressing change;Electrical stimulation;Patient/family education;Pulsatile lavage with suction;Ultrasonic wound therapy @35  KHz (+/- 3 KHz)    Wound Therapy - Frequency 2X / week    Wound Therapy - Current Recommendations PT    Wound Plan debride and dressing changes PRN    Dressing  vaseline to LLE, medihoney, 4x4, abd for shaping, profore lite                PATIENT EDUCATION: Education details: Patient educated  on exam findings, POC, scope of PT, dressing changes if soiled. Person educated: Patient Education method: Explanation Education comprehension: verbalized understanding   HOME EXERCISE PROGRAM:    GOALS: Goals reviewed with patient? Yes  SHORT TERM GOALS: Target date: 02/09/2023    Wound to decrease in size by half to demonstrate wound healing. Baseline: Goal status: INITIAL  2.  Wound to be free from slough.  Baseline:  Goal status: INITIAL    LONG TERM GOALS: Target date: 03/02/2023    Wound to be healed to reduce risk of infection Baseline:  Goal status: INITIAL   ASSESSMENT:  CLINICAL IMPRESSION: Continued need for sharps debridement as with superficial and deeper more adherent layers of slough within wound. Buds of granulation present within wound bed, however slowly granulated.  Edges are approximating as well.  Moisturized perimeter  of wound with vaseline following cleansing and prior to redressing.   Continued with medihoney and profore lite for edema control.  Reminded to get her garments ordered (Measurements taken for compression garments and pt given handout for ETI last session).  OBJECTIVE IMPAIRMENTS: Abnormal gait, decreased activity tolerance, decreased balance, decreased endurance, increased edema, improper body mechanics, and pain.   ACTIVITY LIMITATIONS: bending, standing, squatting, bathing, hygiene/grooming, locomotion level, and caring for others  PARTICIPATION LIMITATIONS: meal prep, cleaning, laundry, shopping, community activity, and yard work  PERSONAL FACTORS: Fitness, Past/current experiences, Time since onset of injury/illness/exacerbation, and 3+ comorbidities: varicose veins, HTN , venous insufficiency  are also affecting patient's functional outcome.   REHAB POTENTIAL: Good  CLINICAL DECISION MAKING: Stable/uncomplicated  EVALUATION COMPLEXITY: Low  PLAN: PT FREQUENCY: 2x/week  PT DURATION: 6 weeks  PLANNED INTERVENTIONS:  Therapeutic exercises, Therapeutic activity, Neuromuscular re-education, Balance training, Gait training, Patient/Family education, Joint manipulation, Joint mobilization, Stair training, Orthotic/Fit training, DME instructions, Aquatic Therapy, Dry Needling, Electrical stimulation, Spinal manipulation, Spinal mobilization, Cryotherapy, Moist heat, Compression bandaging, scar mobilization, Splintting, Taping, Traction, Ultrasound, Ionotophoresis 4mg /ml Dexamethasone, and Manual therapy   PLAN FOR NEXT SESSION: debride and dressing changes PRN.  Measure/photo weekly.     Emeline Gins B, PTA 02/09/2023, 10:29 AM  02/09/2023, 10:29 AM

## 2023-02-14 ENCOUNTER — Ambulatory Visit (HOSPITAL_COMMUNITY): Payer: Medicare PPO | Admitting: Physical Therapy

## 2023-02-14 DIAGNOSIS — R2689 Other abnormalities of gait and mobility: Secondary | ICD-10-CM

## 2023-02-14 DIAGNOSIS — I872 Venous insufficiency (chronic) (peripheral): Secondary | ICD-10-CM | POA: Diagnosis not present

## 2023-02-14 NOTE — Therapy (Signed)
OUTPATIENT PHYSICAL THERAPY WOUND TREATMENT   Patient Name: Jody Hernandez MRN: [id number] DOB:[birth year], 72 y.o., female Today's Date: 02/14/2023   PCP: Elfredia Nevins, MD REFERRING PROVIDER: Elfredia Nevins, MD  END OF SESSION:  PT End of Session - 02/14/23 1040     Visit Number 7    Number of Visits 12    Date for PT Re-Evaluation 03/02/23    Authorization Type [employer]    Authorization Time Period 12 visits requested -12 8/22/thru 10/3    Authorization - Visit Number 7    Authorization - Number of Visits 12    Progress Note Due on Visit 10    PT Start Time 0840    PT Stop Time 0925    PT Time Calculation (min) 45 min    Activity Tolerance Patient tolerated treatment well    Behavior During Therapy WFL for tasks assessed/performed              Past Medical History:  Diagnosis Date   Hypertension    Past Surgical History:  Procedure Laterality Date   BREAST SURGERY     lumpectomy   CYSTOSCOPY WITH STENT PLACEMENT Left 03/09/2017   Procedure: CYSTOSCOPY, RETROGRADE PYELOGRAM WITH STENT PLACEMENT;  Surgeon: Alfredo Martinez, MD;  Location: [facility];  Service: Urology;  Laterality: Left;   Patient Active Problem List   Diagnosis Date Noted   Sepsis (HCC) 03/09/2017   Ureteral stone with hydronephrosis    Hypertension 03/08/2017   Sepsis secondary to UTI (HCC) 03/08/2017   Urinary tract obstruction by kidney stone 03/08/2017    ONSET DATE: 1 month approx.  REFERRING DIAG: L03.119 (ICD-10-CM) - Cellulitis of unspecified part of limb I83.009 (ICD-10-CM) - Varicose veins of unspecified lower extremity with ulcer of unspecified site I87.2 (ICD-10-CM) - Venous insufficiency (chronic) (peripheral)  THERAPY DIAG:  Other abnormalities of gait and mobility  Peripheral venous insufficiency  Rationale for Evaluation and Treatment: Rehabilitation     Wound Therapy - 02/14/23 0001     Subjective PT states the hardest thing is keeping the bandage dry.     Patient and Family Stated Goals wound to heal    Date of Onset 12/19/22    Prior Treatments antibiotics    Pain Scale 0-10    Pain Score 0-No pain, at this time. PT states pain comes and goes. Very painful with debridement.    Evaluation and Treatment Procedures Explained to Patient/Family Yes    Evaluation and Treatment Procedures agreed to    Wound Properties Date First Assessed: 01/19/23 Time First Assessed: 1345 Wound Type: Venous stasis ulcer Location: Pretibial Location Orientation: Left Wound Description (Comments): anterior tibial wound Present on Admission: Yes   Dressing Type Gauze (Comment);Compression wrap   vaseline to LLE, medihoney, 4x4, abd for shaping, profore lite   Dressing Changed Changed    Dressing Status Old drainage    Dressing Change Frequency PRN    Site / Wound Assessment Yellow;Red;Painful    % Wound base Red or Granulating 60%    % Wound base Yellow/Fibrinous Exudate 40%    Peri-wound Assessment Erythema (non-blanchable)    Wound Length (cm) 3 cm    Wound Width (cm) 2.7 cm    Wound Depth (cm) 0.2 cm    Wound Volume (cm^3) 1.62 cm^3    Wound Surface Area (cm^2) 8.1 cm^2    Drainage Amount Scant    Drainage Description Serous    Treatment Cleansed;Debridement (Selective)    Selective Debridement (non-excisional) - Location anterior  tibial region wound    Selective Debridement (non-excisional) - Tools Used Forceps;Scalpel    Selective Debridement (non-excisional) - Tissue Removed slough    Wound Therapy - Clinical Statement see below    Wound Therapy - Functional Problem List bathing    Factors Delaying/Impairing Wound Healing Vascular compromise    Hydrotherapy Plan Debridement;Dressing change;Electrical stimulation;Patient/family education;Pulsatile lavage with suction;Ultrasonic wound therapy @35  KHz (+/- 3 KHz)    Wound Therapy - Frequency 2X / week    Wound Therapy - Current Recommendations PT    Wound Plan debride and dressing changes PRN    Dressing   vaseline to LLE, medihoney, 4x4, abd for shaping, profore lite                   PATIENT EDUCATION: Education details: Patient educated on exam findings, POC, scope of PT, dressing changes if soiled. Person educated: Patient Education method: Explanation Education comprehension: verbalized understanding   HOME EXERCISE PROGRAM:  Ankle pumps  GOALS: Goals reviewed with patient? Yes  SHORT TERM GOALS: Target date: 02/09/2023    Wound to decrease in size by half to demonstrate wound healing. Baseline: Goal status: INITIAL  2.  Wound to be free from slough.  Baseline:  Goal status: INITIAL    LONG TERM GOALS: Target date: 03/02/2023    Wound to be healed to reduce risk of infection Baseline:  Goal status: INITIAL   ASSESSMENT:  CLINICAL IMPRESSION: Pt has increased granulation buds.  Slough is able to be debrided from wound bed with a scapel, however, this is quite painful to the pt.  Edges continue to approximate.  Therapist Moisturized perimeter of wound with vaseline following cleansing and prior to redressing.   Continued with medihoney and profore lite for edema control.  Reminded to get her garments ordered as she has not done this yet.    OBJECTIVE IMPAIRMENTS: Abnormal gait, decreased activity tolerance, decreased balance, decreased endurance, increased edema, improper body mechanics, and pain.   ACTIVITY LIMITATIONS: bending, standing, squatting, bathing, hygiene/grooming, locomotion level, and caring for others  PARTICIPATION LIMITATIONS: meal prep, cleaning, laundry, shopping, community activity, and yard work  PERSONAL FACTORS: Fitness, Past/current experiences, Time since onset of injury/illness/exacerbation, and 3+ comorbidities: varicose veins, HTN , venous insufficiency  are also affecting patient's functional outcome.   REHAB POTENTIAL: Good  CLINICAL DECISION MAKING: Stable/uncomplicated  EVALUATION COMPLEXITY: Low  PLAN: PT FREQUENCY:  2x/week  PT DURATION: 6 weeks  PLANNED INTERVENTIONS: Therapeutic exercises, Therapeutic activity, Neuromuscular re-education, Balance training, Gait training, Patient/Family education, Joint manipulation, Joint mobilization, Stair training, Orthotic/Fit training, DME instructions, Aquatic Therapy, Dry Needling, Electrical stimulation, Spinal manipulation, Spinal mobilization, Cryotherapy, Moist heat, Compression bandaging, scar mobilization, Splintting, Taping, Traction, Ultrasound, Ionotophoresis 4mg /ml Dexamethasone, and Manual therapy   PLAN FOR NEXT SESSION: debride and dressing changes PRN.  Measure/photo weekly.    Virgina Organ, PT CLT [phone]  02/14/2023, 10:45 AM

## 2023-02-17 ENCOUNTER — Ambulatory Visit (HOSPITAL_COMMUNITY): Payer: Medicare PPO | Admitting: Physical Therapy

## 2023-02-17 DIAGNOSIS — I872 Venous insufficiency (chronic) (peripheral): Secondary | ICD-10-CM

## 2023-02-17 DIAGNOSIS — R2689 Other abnormalities of gait and mobility: Secondary | ICD-10-CM

## 2023-02-17 NOTE — Therapy (Signed)
OUTPATIENT PHYSICAL THERAPY WOUND TREATMENT   Patient Name: Jody Hernandez MRN: [id number] DOB:[birth year], 72 y.o., female Today's Date: 02/17/2023   PCP: Elfredia Nevins, MD REFERRING PROVIDER: Elfredia Nevins, MD  END OF SESSION:  PT End of Session - 02/17/23 1230     Visit Number 8    Number of Visits 12    Date for PT Re-Evaluation 03/02/23    Authorization Type [employer]    Authorization Time Period 12 visits requested -12 8/22/thru 10/3    Authorization - Visit Number 8    Authorization - Number of Visits 12    Progress Note Due on Visit 10    PT Start Time 1145    PT Stop Time 1225    PT Time Calculation (min) 40 min    Activity Tolerance Patient tolerated treatment well    Behavior During Therapy WFL for tasks assessed/performed               Past Medical History:  Diagnosis Date   Hypertension    Past Surgical History:  Procedure Laterality Date   BREAST SURGERY     lumpectomy   CYSTOSCOPY WITH STENT PLACEMENT Left 03/09/2017   Procedure: CYSTOSCOPY, RETROGRADE PYELOGRAM WITH STENT PLACEMENT;  Surgeon: Alfredo Martinez, MD;  Location: [facility];  Service: Urology;  Laterality: Left;   Patient Active Problem List   Diagnosis Date Noted   Sepsis (HCC) 03/09/2017   Ureteral stone with hydronephrosis    Hypertension 03/08/2017   Sepsis secondary to UTI (HCC) 03/08/2017   Urinary tract obstruction by kidney stone 03/08/2017    ONSET DATE: 1 month approx.  REFERRING DIAG: L03.119 (ICD-10-CM) - Cellulitis of unspecified part of limb I83.009 (ICD-10-CM) - Varicose veins of unspecified lower extremity with ulcer of unspecified site I87.2 (ICD-10-CM) - Venous insufficiency (chronic) (peripheral)  THERAPY DIAG:  Other abnormalities of gait and mobility  Peripheral venous insufficiency  Rationale for Evaluation and Treatment: Rehabilitation     Wound Therapy - 02/14/23 0001     Subjective PT states that she received her compression garments and is  going to practice putting the garment on her Right leg.    Patient and Family Stated Goals wound to heal    Date of Onset 12/19/22    Prior Treatments antibiotics    Pain Scale 0-10    Pain Score 0-No pain, at this time. PT states pain comes and goes. Very painful with debridement.    Evaluation and Treatment Procedures Explained to Patient/Family Yes    Evaluation and Treatment Procedures agreed to    Wound Properties Date First Assessed: 01/19/23 Time First Assessed: 1345 Wound Type: Venous stasis ulcer Location: Pretibial Location Orientation: Left Wound Description (Comments): anterior tibial wound Present on Admission: Yes   Dressing Type Gauze (Comment);Compression wrap   vaseline to LLE, medihoney, 4x4, abd for shaping, profore lite   Dressing Changed Changed    Dressing Status Old drainage    Dressing Change Frequency PRN    Site / Wound Assessment Yellow;Red;Painful    % Wound base Red or Granulating 60%    % Wound base Yellow/Fibrinous Exudate 40%    Peri-wound Assessment Erythema (non-blanchable)    Wound Length (cm) 3 cm    Wound Width (cm) 2.7 cm    Wound Depth (cm) 0.2 cm    Wound Volume (cm^3) 1.62 cm^3    Wound Surface Area (cm^2) 8.1 cm^2    Drainage Amount Scant    Drainage Description Serous    Treatment  Cleansed;Debridement (Selective)    Selective Debridement (non-excisional) - Location anterior tibial region wound    Selective Debridement (non-excisional) - Tools Used Forceps;Scalpel    Selective Debridement (non-excisional) - Tissue Removed slough    Wound Therapy - Clinical Statement see below    Wound Therapy - Functional Problem List bathing    Factors Delaying/Impairing Wound Healing Vascular compromise    Hydrotherapy Plan Debridement;Dressing change;Electrical stimulation;Patient/family education;Pulsatile lavage with suction;Ultrasonic wound therapy @35  KHz (+/- 3 KHz)    Wound Therapy - Frequency 2X / week    Wound Therapy - Current Recommendations PT     Wound Plan debride and dressing changes PRN    Dressing  vaseline to LLE, medihoney, 4x4, abd for shaping, profore lite                   PATIENT EDUCATION: Education details: Patient educated on exam findings, POC, scope of PT, dressing changes if soiled. Person educated: Patient Education method: Explanation Education comprehension: verbalized understanding   HOME EXERCISE PROGRAM:  Ankle pumps  GOALS: Goals reviewed with patient? Yes  SHORT TERM GOALS: Target date: 02/09/2023    Wound to decrease in size by half to demonstrate wound healing. Baseline: Goal status: IN PROGRESS  2.  Wound to be free from slough.  Baseline:  Goal status: IN PROGRESS    LONG TERM GOALS: Target date: 03/02/2023    Wound to be healed to reduce risk of infection Baseline:  Goal status: IN PROGRESS   ASSESSMENT:  CLINICAL IMPRESSION: Pt continues to have increased granulation buds with wound having increased approximation.    Slough is able to be debrided from wound bed with a scapel, however, this is quite painful to the pt.    Therapist Moisturized perimeter of wound with vaseline following cleansing and prior to redressing.   Continued with medihoney and profore lite for edema control.  Reminded to get her garments ordered as she has not done this yet.    OBJECTIVE IMPAIRMENTS: Abnormal gait, decreased activity tolerance, decreased balance, decreased endurance, increased edema, improper body mechanics, and pain.   ACTIVITY LIMITATIONS: bending, standing, squatting, bathing, hygiene/grooming, locomotion level, and caring for others  PARTICIPATION LIMITATIONS: meal prep, cleaning, laundry, shopping, community activity, and yard work  PERSONAL FACTORS: Fitness, Past/current experiences, Time since onset of injury/illness/exacerbation, and 3+ comorbidities: varicose veins, HTN , venous insufficiency  are also affecting patient's functional outcome.   REHAB POTENTIAL:  Good  CLINICAL DECISION MAKING: Stable/uncomplicated  EVALUATION COMPLEXITY: Low  PLAN: PT FREQUENCY: 2x/week  PT DURATION: 6 weeks  PLANNED INTERVENTIONS: Therapeutic exercises, Therapeutic activity, Neuromuscular re-education, Balance training, Gait training, Patient/Family education, Joint manipulation, Joint mobilization, Stair training, Orthotic/Fit training, DME instructions, Aquatic Therapy, Dry Needling, Electrical stimulation, Spinal manipulation, Spinal mobilization, Cryotherapy, Moist heat, Compression bandaging, scar mobilization, Splintting, Taping, Traction, Ultrasound, Ionotophoresis 4mg /ml Dexamethasone, and Manual therapy   PLAN FOR NEXT SESSION: debride and dressing changes PRN.  Measure/photo weekly.    Virgina Organ, PT CLT [phone]  02/17/2023, 12:32 PM

## 2023-02-20 ENCOUNTER — Encounter (HOSPITAL_COMMUNITY): Payer: Self-pay

## 2023-02-20 ENCOUNTER — Ambulatory Visit (HOSPITAL_COMMUNITY): Payer: Medicare PPO

## 2023-02-20 DIAGNOSIS — I872 Venous insufficiency (chronic) (peripheral): Secondary | ICD-10-CM | POA: Diagnosis not present

## 2023-02-20 DIAGNOSIS — R2689 Other abnormalities of gait and mobility: Secondary | ICD-10-CM

## 2023-02-20 NOTE — Therapy (Signed)
OUTPATIENT PHYSICAL THERAPY WOUND TREATMENT   Patient Name: Jody Hernandez MRN: [id number] DOB:May 02, 1951, 72 y.o., female Today's Date: 02/20/2023   PCP: Elfredia Nevins, MD REFERRING PROVIDER: Elfredia Nevins, MD  END OF SESSION:  PT End of Session - 02/20/23 1117     Visit Number 9    Number of Visits 12    Date for PT Re-Evaluation 03/02/23    Authorization Type [employer]    Authorization Time Period 12 visits requested -12 8/22/thru 10/3    Authorization - Visit Number 9    Authorization - Number of Visits 12    Progress Note Due on Visit 10    PT Start Time 1040    PT Stop Time 1113    PT Time Calculation (min) 33 min    Activity Tolerance Patient tolerated treatment well    Behavior During Therapy WFL for tasks assessed/performed               Past Medical History:  Diagnosis Date   Hypertension    Past Surgical History:  Procedure Laterality Date   BREAST SURGERY     lumpectomy   CYSTOSCOPY WITH STENT PLACEMENT Left 03/09/2017   Procedure: CYSTOSCOPY, RETROGRADE PYELOGRAM WITH STENT PLACEMENT;  Surgeon: Alfredo Martinez, MD;  Location: [facility];  Service: Urology;  Laterality: Left;   Patient Active Problem List   Diagnosis Date Noted   Sepsis (HCC) 03/09/2017   Ureteral stone with hydronephrosis    Hypertension 03/08/2017   Sepsis secondary to UTI (HCC) 03/08/2017   Urinary tract obstruction by kidney stone 03/08/2017    ONSET DATE: 1 month approx.  REFERRING DIAG: L03.119 (ICD-10-CM) - Cellulitis of unspecified part of limb I83.009 (ICD-10-CM) - Varicose veins of unspecified lower extremity with ulcer of unspecified site I87.2 (ICD-10-CM) - Venous insufficiency (chronic) (peripheral)  THERAPY DIAG:  Other abnormalities of gait and mobility  Peripheral venous insufficiency  Rationale for Evaluation and Treatment: Rehabilitation   Wound Therapy - 02/20/23 0001     Subjective Pt arrived with dressings intact, no reports of pain currently.     Patient and Family Stated Goals wound to heal    Date of Onset 12/19/22    Prior Treatments antibiotics    Pain Scale 0-10    Pain Score 0-No pain    Evaluation and Treatment Procedures Explained to Patient/Family Yes    Evaluation and Treatment Procedures agreed to    Wound Properties Date First Assessed: 01/19/23 Time First Assessed: 1345 Wound Type: Venous stasis ulcer Location: Pretibial Location Orientation: Left Wound Description (Comments): anterior tibial wound Present on Admission: Yes   Wound Image Images linked: 1    Dressing Type Gauze (Comment);Compression wrap   vaseline to LLE, medihoney, 4x4, abd for shaping, profore lite   Dressing Changed Changed    Dressing Status Old drainage    Dressing Change Frequency PRN    Site / Wound Assessment Yellow;Red;Painful    % Wound base Red or Granulating 70%    % Wound base Yellow/Fibrinous Exudate 30%    Drainage Amount Minimal    Drainage Description Serous    Treatment Cleansed;Debridement (Selective)    Selective Debridement (non-excisional) - Location anterior tibial region wound    Selective Debridement (non-excisional) - Tools Used Forceps;Scalpel    Selective Debridement (non-excisional) - Tissue Removed slough    Wound Therapy - Clinical Statement see below    Wound Therapy - Functional Problem List bathing    Factors Delaying/Impairing Wound Healing Vascular compromise  Hydrotherapy Plan Debridement;Dressing change;Electrical stimulation;Patient/family education;Pulsatile lavage with suction;Ultrasonic wound therapy @35  KHz (+/- 3 KHz)    Wound Therapy - Frequency 2X / week    Wound Therapy - Current Recommendations PT    Wound Plan debride and dressing changes PRN    Dressing  vaseline to LLE, medihoney, 4x4, abd for shaping, profore lite             PATIENT EDUCATION: Education details: Patient educated on exam findings, POC, scope of PT, dressing changes if soiled. Person educated: Patient Education  method: Explanation Education comprehension: verbalized understanding   HOME EXERCISE PROGRAM:  Ankle pumps  GOALS: Goals reviewed with patient? Yes  SHORT TERM GOALS: Target date: 02/09/2023    Wound to decrease in size by half to demonstrate wound healing. Baseline: Goal status: IN PROGRESS  2.  Wound to be free from slough.  Baseline:  Goal status: IN PROGRESS    LONG TERM GOALS: Target date: 03/02/2023    Wound to be healed to reduce risk of infection Baseline:  Goal status: IN PROGRESS   ASSESSMENT:  CLINICAL IMPRESSION: Wound presents with improved approximation and increased granulation tissues.  Selective debridement for removal of adherent slough that is removing slowly with scalpel.  Pt limited by pain, limited debridement this session.  Continued with medihoney, vaseline and profore for edema control.  Encouraged pt to bring in compression garments next session.    OBJECTIVE IMPAIRMENTS: Abnormal gait, decreased activity tolerance, decreased balance, decreased endurance, increased edema, improper body mechanics, and pain.   ACTIVITY LIMITATIONS: bending, standing, squatting, bathing, hygiene/grooming, locomotion level, and caring for others  PARTICIPATION LIMITATIONS: meal prep, cleaning, laundry, shopping, community activity, and yard work  PERSONAL FACTORS: Fitness, Past/current experiences, Time since onset of injury/illness/exacerbation, and 3+ comorbidities: varicose veins, HTN , venous insufficiency  are also affecting patient's functional outcome.   REHAB POTENTIAL: Good  CLINICAL DECISION MAKING: Stable/uncomplicated  EVALUATION COMPLEXITY: Low  PLAN: PT FREQUENCY: 2x/week  PT DURATION: 6 weeks  PLANNED INTERVENTIONS: Therapeutic exercises, Therapeutic activity, Neuromuscular re-education, Balance training, Gait training, Patient/Family education, Joint manipulation, Joint mobilization, Stair training, Orthotic/Fit training, DME instructions,  Aquatic Therapy, Dry Needling, Electrical stimulation, Spinal manipulation, Spinal mobilization, Cryotherapy, Moist heat, Compression bandaging, scar mobilization, Splintting, Taping, Traction, Ultrasound, Ionotophoresis 4mg /ml Dexamethasone, and Manual therapy   PLAN FOR NEXT SESSION: debride and dressing changes PRN.  Measure/photo weekly.    Becky Sax, LPTA/CLT; CBIS [phone]  Juel Burrow, PTA 02/20/2023, 11:19 AM  02/20/2023, 11:19 AM

## 2023-02-23 ENCOUNTER — Encounter (HOSPITAL_COMMUNITY): Payer: Self-pay

## 2023-02-23 ENCOUNTER — Ambulatory Visit (HOSPITAL_COMMUNITY): Payer: Medicare PPO

## 2023-02-23 DIAGNOSIS — R2689 Other abnormalities of gait and mobility: Secondary | ICD-10-CM | POA: Diagnosis not present

## 2023-02-23 DIAGNOSIS — I872 Venous insufficiency (chronic) (peripheral): Secondary | ICD-10-CM | POA: Diagnosis not present

## 2023-03-01 ENCOUNTER — Ambulatory Visit (HOSPITAL_COMMUNITY): Payer: Medicare PPO | Attending: Internal Medicine

## 2023-03-01 ENCOUNTER — Encounter (HOSPITAL_COMMUNITY): Payer: Self-pay

## 2023-03-01 DIAGNOSIS — I872 Venous insufficiency (chronic) (peripheral): Secondary | ICD-10-CM | POA: Diagnosis not present

## 2023-03-01 DIAGNOSIS — R2689 Other abnormalities of gait and mobility: Secondary | ICD-10-CM | POA: Diagnosis not present

## 2023-03-01 NOTE — Therapy (Signed)
OUTPATIENT PHYSICAL THERAPY WOUND TREATMENT  Patient Name: Jody Hernandez MRN: [id number] DOB:03/26/1951, 72 y.o., female Today's Date: 03/01/2023   PCP: Elfredia Nevins, MD REFERRING PROVIDER: Elfredia Nevins, MD  END OF SESSION:  PT End of Session - 03/01/23 0802     Visit Number 11    Number of Visits 12    Date for PT Re-Evaluation 03/02/23    Authorization Type [employer]    Authorization Time Period 12 visits requested -12 8/22/thru 10/3; [employer] authorization requested on 03/01/23 for 12 visits 10/4-->04/14/23    Authorization - Visit Number 11    Authorization - Number of Visits 12    Progress Note Due on Visit 20    PT Start Time 0732    PT Stop Time 0800    PT Time Calculation (min) 28 min    Activity Tolerance Patient tolerated treatment well    Behavior During Therapy WFL for tasks assessed/performed               Past Medical History:  Diagnosis Date   Hypertension    Past Surgical History:  Procedure Laterality Date   BREAST SURGERY     lumpectomy   CYSTOSCOPY WITH STENT PLACEMENT Left 03/09/2017   Procedure: CYSTOSCOPY, RETROGRADE PYELOGRAM WITH STENT PLACEMENT;  Surgeon: Alfredo Martinez, MD;  Location: [facility];  Service: Urology;  Laterality: Left;   Patient Active Problem List   Diagnosis Date Noted   Sepsis (HCC) 03/09/2017   Ureteral stone with hydronephrosis    Hypertension 03/08/2017   Sepsis secondary to UTI (HCC) 03/08/2017   Urinary tract obstruction by kidney stone 03/08/2017    ONSET DATE: 1 month approx.  REFERRING DIAG: L03.119 (ICD-10-CM) - Cellulitis of unspecified part of limb I83.009 (ICD-10-CM) - Varicose veins of unspecified lower extremity with ulcer of unspecified site I87.2 (ICD-10-CM) - Venous insufficiency (chronic) (peripheral)  THERAPY DIAG:  Other abnormalities of gait and mobility  Peripheral venous insufficiency  Rationale for Evaluation and Treatment: Rehabilitation   Wound Therapy - 03/01/23 0001      Subjective Pt arrived with dressings intact, no reports of pain.  Reports she has been itching.    Patient and Family Stated Goals wound to heal    Date of Onset 12/19/22    Prior Treatments antibiotics    Pain Scale 0-10    Pain Score 0-No pain    Evaluation and Treatment Procedures Explained to Patient/Family Yes    Evaluation and Treatment Procedures agreed to    Wound Properties Date First Assessed: 01/19/23 Time First Assessed: 1345 Wound Type: Venous stasis ulcer Location: Pretibial Location Orientation: Left Wound Description (Comments): anterior tibial wound Present on Admission: Yes   Wound Image Images linked: 1    Dressing Type Gauze (Comment);Compression wrap    Dressing Changed Changed    Dressing Status Old drainage    Dressing Change Frequency PRN    Site / Wound Assessment Yellow;Red;Painful    % Wound base Red or Granulating 90%    % Wound base Yellow/Fibrinous Exudate 10%    Wound Length (cm) 2.7 cm    Wound Width (cm) 2 cm    Wound Depth (cm) 0.2 cm    Wound Volume (cm^3) 1.08 cm^3    Wound Surface Area (cm^2) 5.4 cm^2    Drainage Amount Minimal    Drainage Description Serous    Treatment Cleansed;Debridement (Selective)    Selective Debridement (non-excisional) - Location anterior tibial region wound    Selective Debridement (non-excisional) - Tools  Used Forceps;Scalpel    Selective Debridement (non-excisional) - Tissue Removed slough    Wound Therapy - Clinical Statement see below    Wound Therapy - Functional Problem List bathing    Factors Delaying/Impairing Wound Healing Vascular compromise    Hydrotherapy Plan Debridement;Dressing change;Electrical stimulation;Patient/family education;Pulsatile lavage with suction;Ultrasonic wound therapy @35  KHz (+/- 3 KHz)    Wound Therapy - Frequency 2X / week    Wound Therapy - Current Recommendations PT    Wound Plan debride and dressing changes PRN    Dressing  vaseline to LLE, medihoney, 4x4, cotton for shape, kerlix  and coban with netting             PATIENT EDUCATION: Education details: Patient educated on exam findings, POC, scope of PT, dressing changes if soiled. Person educated: Patient Education method: Explanation Education comprehension: verbalized understanding   HOME EXERCISE PROGRAM:  Ankle pumps  GOALS: Goals reviewed with patient? Yes  SHORT TERM GOALS: Target date: 02/09/2023    Wound to decrease in size by half to demonstrate wound healing. Baseline: Goal status: IN PROGRESS  2.  Wound to be free from slough.  Baseline:  Goal status: IN PROGRESS    LONG TERM GOALS: Target date: 03/02/2023    Wound to be healed to reduce risk of infection Baseline:  Goal status: IN PROGRESS   ASSESSMENT:  CLINICAL IMPRESSION: Wound is approximating well with increased granulation tissue.  Continued with medihoney to address slough adherent to wound bed, kerlix and coban for edema control.  Reports of comfort at EOS.  OBJECTIVE IMPAIRMENTS: Abnormal gait, decreased activity tolerance, decreased balance, decreased endurance, increased edema, improper body mechanics, and pain.   ACTIVITY LIMITATIONS: bending, standing, squatting, bathing, hygiene/grooming, locomotion level, and caring for others  PARTICIPATION LIMITATIONS: meal prep, cleaning, laundry, shopping, community activity, and yard work  PERSONAL FACTORS: Fitness, Past/current experiences, Time since onset of injury/illness/exacerbation, and 3+ comorbidities: varicose veins, HTN , venous insufficiency  are also affecting patient's functional outcome.   REHAB POTENTIAL: Good  CLINICAL DECISION MAKING: Stable/uncomplicated  EVALUATION COMPLEXITY: Low  PLAN: PT FREQUENCY: 2x/week  PT DURATION: 6 weeks  PLANNED INTERVENTIONS: Therapeutic exercises, Therapeutic activity, Neuromuscular re-education, Balance training, Gait training, Patient/Family education, Joint manipulation, Joint mobilization, Stair training,  Orthotic/Fit training, DME instructions, Aquatic Therapy, Dry Needling, Electrical stimulation, Spinal manipulation, Spinal mobilization, Cryotherapy, Moist heat, Compression bandaging, scar mobilization, Splintting, Taping, Traction, Ultrasound, Ionotophoresis 4mg /ml Dexamethasone, and Manual therapy   PLAN FOR NEXT SESSION: debride and dressing changes PRN.  Measure/photo weekly.    Becky Sax, LPTA/CLT; CBIS [phone]  Juel Burrow, PTA 03/01/2023, 9:32 AM  03/01/2023, 9:32 AM

## 2023-03-03 ENCOUNTER — Ambulatory Visit (HOSPITAL_COMMUNITY): Payer: Medicare PPO | Admitting: Physical Therapy

## 2023-03-03 DIAGNOSIS — I872 Venous insufficiency (chronic) (peripheral): Secondary | ICD-10-CM | POA: Diagnosis not present

## 2023-03-03 DIAGNOSIS — R2689 Other abnormalities of gait and mobility: Secondary | ICD-10-CM | POA: Diagnosis not present

## 2023-03-03 DIAGNOSIS — S81802D Unspecified open wound, left lower leg, subsequent encounter: Secondary | ICD-10-CM

## 2023-03-03 NOTE — Therapy (Signed)
OUTPATIENT PHYSICAL THERAPY WOUND TREATMENT  Patient Name: Jody Hernandez MRN: [id number] DOB:09/10/1950, 72 y.o., female Today's Date: 03/03/2023   PCP: Elfredia Nevins, MD REFERRING PROVIDER: Elfredia Nevins, MD  END OF SESSION:  PT End of Session - 03/03/23 1350     Visit Number 12    Number of Visits 23    Date for PT Re-Evaluation 04/14/23    Authorization Type [employer]    Authorization Time Period 12 visits approved from 10/4 thr 11/15    Authorization - Visit Number 1    Authorization - Number of Visits 12    Progress Note Due on Visit 23    PT Start Time 0841    PT Stop Time 0920    PT Time Calculation (min) 39 min    Activity Tolerance Patient tolerated treatment well    Behavior During Therapy WFL for tasks assessed/performed               Past Medical History:  Diagnosis Date   Hypertension    Past Surgical History:  Procedure Laterality Date   BREAST SURGERY     lumpectomy   CYSTOSCOPY WITH STENT PLACEMENT Left 03/09/2017   Procedure: CYSTOSCOPY, RETROGRADE PYELOGRAM WITH STENT PLACEMENT;  Surgeon: Alfredo Martinez, MD;  Location: [facility];  Service: Urology;  Laterality: Left;   Patient Active Problem List   Diagnosis Date Noted   Sepsis (HCC) 03/09/2017   Ureteral stone with hydronephrosis    Hypertension 03/08/2017   Sepsis secondary to UTI (HCC) 03/08/2017   Urinary tract obstruction by kidney stone 03/08/2017    ONSET DATE: 1 month approx.  REFERRING DIAG: L03.119 (ICD-10-CM) - Cellulitis of unspecified part of limb I83.009 (ICD-10-CM) - Varicose veins of unspecified lower extremity with ulcer of unspecified site I87.2 (ICD-10-CM) - Venous insufficiency (chronic) (peripheral)  THERAPY DIAG:  Other abnormalities of gait and mobility  Peripheral venous insufficiency  Rationale for Evaluation and Treatment: Rehabilitation   Wound Therapy - 03/03/23 0001     Subjective PT states that she occasionally has discomfort but not at this  time.    Patient and Family Stated Goals wound to heal    Date of Onset 12/19/22    Prior Treatments antibiotics    Pain Score 0-No pain    Evaluation and Treatment Procedures Explained to Patient/Family Yes    Evaluation and Treatment Procedures agreed to    Wound Properties Date First Assessed: 01/19/23 Time First Assessed: 1345 Wound Type: Venous stasis ulcer Location: Pretibial Location Orientation: Left Wound Description (Comments): anterior tibial wound Present on Admission: Yes   Dressing Type Gauze (Comment);Compression wrap    Dressing Changed Changed    Dressing Status Old drainage    Dressing Change Frequency PRN    Site / Wound Assessment Yellow;Red;Painful    % Wound base Red or Granulating 90%   100% granulated following debridement.   % Wound base Yellow/Fibrinous Exudate 10%    Peri-wound Assessment Erythema (non-blanchable)    Drainage Amount Minimal    Drainage Description Serous    Treatment Cleansed;Debridement (Selective)    Selective Debridement (non-excisional) - Location anterior tibial region wound    Selective Debridement (non-excisional) - Tools Used Forceps;Scalpel    Selective Debridement (non-excisional) - Tissue Removed slough    Wound Therapy - Clinical Statement see below    Wound Therapy - Functional Problem List bathing    Factors Delaying/Impairing Wound Healing Vascular compromise    Hydrotherapy Plan Debridement;Dressing change;Electrical stimulation;Patient/family education;Pulsatile lavage with suction;Ultrasonic wound  therapy @35  KHz (+/- 3 KHz)    Wound Therapy - Frequency 2X / week    Wound Therapy - Current Recommendations PT    Wound Plan debride and dressing changes PRN    Dressing  vaseline to LLE, medihoney, 4x4, profore lite.             PATIENT EDUCATION: Education details: Patient educated on exam findings, POC, scope of PT, dressing changes if soiled. Person educated: Patient Education method: Explanation Education  comprehension: verbalized understanding   HOME EXERCISE PROGRAM:  Ankle pumps  GOALS: Goals reviewed with patient? Yes  SHORT TERM GOALS: Target date: 02/09/2023    Wound to decrease in size by half to demonstrate wound healing. Baseline: Goal status: IN PROGRESS  2.  Wound to be free from slough.  Baseline:  Goal status: MET    LONG TERM GOALS: Target date: 03/02/2023    Wound to be healed to reduce risk of infection Baseline:  Goal status: IN PROGRESS   ASSESSMENT:  CLINICAL IMPRESSION: Today's treatment is the first time pt wound has been able to be debrided to reveal 100% granulation.  Continued dressing with medihoney and profore lite.   OBJECTIVE IMPAIRMENTS: Abnormal gait, decreased activity tolerance, decreased balance, decreased endurance, increased edema, improper body mechanics, and pain.   ACTIVITY LIMITATIONS: bending, standing, squatting, bathing, hygiene/grooming, locomotion level, and caring for others  PARTICIPATION LIMITATIONS: meal prep, cleaning, laundry, shopping, community activity, and yard work  PERSONAL FACTORS: Fitness, Past/current experiences, Time since onset of injury/illness/exacerbation, and 3+ comorbidities: varicose veins, HTN , venous insufficiency  are also affecting patient's functional outcome.   REHAB POTENTIAL: Good  CLINICAL DECISION MAKING: Stable/uncomplicated  EVALUATION COMPLEXITY: Low  PLAN: PT FREQUENCY: 2x/week  PT DURATION: 6 weeks  PLANNED INTERVENTIONS: Therapeutic exercises, Therapeutic activity, Neuromuscular re-education, Balance training, Gait training, Patient/Family education, Joint manipulation, Joint mobilization, Stair training, Orthotic/Fit training, DME instructions, Aquatic Therapy, Dry Needling, Electrical stimulation, Spinal manipulation, Spinal mobilization, Cryotherapy, Moist heat, Compression bandaging, scar mobilization, Splintting, Taping, Traction, Ultrasound, Ionotophoresis 4mg /ml  Dexamethasone, and Manual therapy   PLAN FOR NEXT SESSION: debride and dressing changes PRN.  Measure/photo weekly.    Virgina Organ, PT CLT [phone]  03/03/2023, 1:54 PM

## 2023-03-07 ENCOUNTER — Ambulatory Visit (HOSPITAL_COMMUNITY): Payer: Medicare PPO | Admitting: Physical Therapy

## 2023-03-07 DIAGNOSIS — I872 Venous insufficiency (chronic) (peripheral): Secondary | ICD-10-CM

## 2023-03-07 DIAGNOSIS — R2689 Other abnormalities of gait and mobility: Secondary | ICD-10-CM | POA: Diagnosis not present

## 2023-03-07 NOTE — Therapy (Signed)
OUTPATIENT PHYSICAL THERAPY WOUND TREATMENT  Patient Name: Jody Hernandez MRN: [id number] DOB:[birth year], 72 y.o., female Today's Date: 03/07/2023   PCP: Elfredia Nevins, MD REFERRING PROVIDER: Elfredia Nevins, MD  END OF SESSION:  PT End of Session - 03/07/23 1047     Visit Number 13    Number of Visits 23    Date for PT Re-Evaluation 04/14/23    Authorization Type [employer]    Authorization Time Period 12 visits approved from 10/4 thr 11/15    Authorization - Visit Number 2    Authorization - Number of Visits 12    Progress Note Due on Visit 23    PT Start Time 1018    PT Stop Time 1045    PT Time Calculation (min) 27 min    Activity Tolerance Patient tolerated treatment well    Behavior During Therapy WFL for tasks assessed/performed               Past Medical History:  Diagnosis Date   Hypertension    Past Surgical History:  Procedure Laterality Date   BREAST SURGERY     lumpectomy   CYSTOSCOPY WITH STENT PLACEMENT Left 03/09/2017   Procedure: CYSTOSCOPY, RETROGRADE PYELOGRAM WITH STENT PLACEMENT;  Surgeon: Alfredo Martinez, MD;  Location: [facility];  Service: Urology;  Laterality: Left;   Patient Active Problem List   Diagnosis Date Noted   Sepsis (HCC) 03/09/2017   Ureteral stone with hydronephrosis    Hypertension 03/08/2017   Sepsis secondary to UTI (HCC) 03/08/2017   Urinary tract obstruction by kidney stone 03/08/2017    ONSET DATE: 1 month approx.  REFERRING DIAG: L03.119 (ICD-10-CM) - Cellulitis of unspecified part of limb I83.009 (ICD-10-CM) - Varicose veins of unspecified lower extremity with ulcer of unspecified site I87.2 (ICD-10-CM) - Venous insufficiency (chronic) (peripheral)  THERAPY DIAG:  Other abnormalities of gait and mobility  Peripheral venous insufficiency  Rationale for Evaluation and Treatment: Rehabilitation   Wound Therapy - 03/07/23 1048     Subjective pt states it's taking a while to heal.    Patient and Family  Stated Goals wound to heal    Date of Onset 12/19/22    Prior Treatments antibiotics    Pain Score 0-No pain    Evaluation and Treatment Procedures Explained to Patient/Family Yes    Evaluation and Treatment Procedures agreed to    Wound Properties Date First Assessed: 01/19/23 Time First Assessed: 1345 Wound Type: Venous stasis ulcer Location: Pretibial Location Orientation: Left Wound Description (Comments): anterior tibial wound Present on Admission: Yes   Dressing Type Gauze (Comment);Compression wrap    Dressing Changed Changed    Dressing Status Old drainage    Dressing Change Frequency PRN    Site / Wound Assessment Yellow;Red;Painful    % Wound base Red or Granulating 90%    % Wound base Yellow/Fibrinous Exudate 10%    Peri-wound Assessment Erythema (non-blanchable)    Drainage Amount Minimal    Drainage Description Serous    Treatment Cleansed;Debridement (Selective)    Selective Debridement (non-excisional) - Location anterior tibial region wound    Selective Debridement (non-excisional) - Tools Used Forceps;Scalpel    Selective Debridement (non-excisional) - Tissue Removed slough    Wound Therapy - Clinical Statement see below    Wound Therapy - Functional Problem List bathing    Factors Delaying/Impairing Wound Healing Vascular compromise    Hydrotherapy Plan Debridement;Dressing change;Electrical stimulation;Patient/family education;Pulsatile lavage with suction;Ultrasonic wound therapy @35  KHz (+/- 3 KHz)  Wound Therapy - Frequency 2X / week    Wound Therapy - Current Recommendations PT    Wound Plan debride and dressing changes PRN    Dressing  vaseline to LLE, xeroform, kerlix, cotton at ankle, coban and #5 netting.                PATIENT EDUCATION: Education details: Patient educated on exam findings, POC, scope of PT, dressing changes if soiled. Person educated: Patient Education method: Explanation Education comprehension: verbalized  understanding   HOME EXERCISE PROGRAM:  Ankle pumps  GOALS: Goals reviewed with patient? Yes  SHORT TERM GOALS: Target date: 02/09/2023    Wound to decrease in size by half to demonstrate wound healing. Baseline: Goal status: IN PROGRESS  2.  Wound to be free from slough.  Baseline:  Goal status: MET    LONG TERM GOALS: Target date: 03/02/2023    Wound to be healed to reduce risk of infection Baseline:  Goal status: IN PROGRESS   ASSESSMENT:  CLINICAL IMPRESSION: Pt required additional debridement to remove devitalized tissue perimeter and some slough from wound bed.  Erythema continues to be present perimeter of wound but without signs/symptoms of infection.  Changed dressing today to xeroform since granulation is now above 90%.  Kerlix and cotton used prior to coban for compression.  Pt reported overall comfort with dressing.    OBJECTIVE IMPAIRMENTS: Abnormal gait, decreased activity tolerance, decreased balance, decreased endurance, increased edema, improper body mechanics, and pain.   ACTIVITY LIMITATIONS: bending, standing, squatting, bathing, hygiene/grooming, locomotion level, and caring for others  PARTICIPATION LIMITATIONS: meal prep, cleaning, laundry, shopping, community activity, and yard work  PERSONAL FACTORS: Fitness, Past/current experiences, Time since onset of injury/illness/exacerbation, and 3+ comorbidities: varicose veins, HTN , venous insufficiency  are also affecting patient's functional outcome.   REHAB POTENTIAL: Good  CLINICAL DECISION MAKING: Stable/uncomplicated  EVALUATION COMPLEXITY: Low  PLAN: PT FREQUENCY: 2x/week  PT DURATION: 6 weeks  PLANNED INTERVENTIONS: Therapeutic exercises, Therapeutic activity, Neuromuscular re-education, Balance training, Gait training, Patient/Family education, Joint manipulation, Joint mobilization, Stair training, Orthotic/Fit training, DME instructions, Aquatic Therapy, Dry Needling, Electrical  stimulation, Spinal manipulation, Spinal mobilization, Cryotherapy, Moist heat, Compression bandaging, scar mobilization, Splintting, Taping, Traction, Ultrasound, Ionotophoresis 4mg /ml Dexamethasone, and Manual therapy   PLAN FOR NEXT SESSION: debride and dressing changes PRN.  Measure/photo weekly.  See how wound responds to xeroform.  Lurena Nida, PTA/CLT [facility] Ph: [phone]  03/07/2023, 10:50 AM

## 2023-03-09 ENCOUNTER — Ambulatory Visit (HOSPITAL_COMMUNITY): Payer: Medicare PPO | Admitting: Physical Therapy

## 2023-03-09 DIAGNOSIS — I872 Venous insufficiency (chronic) (peripheral): Secondary | ICD-10-CM

## 2023-03-09 DIAGNOSIS — R2689 Other abnormalities of gait and mobility: Secondary | ICD-10-CM

## 2023-03-09 NOTE — Therapy (Signed)
OUTPATIENT PHYSICAL THERAPY WOUND TREATMENT  Patient Name: Jody Hernandez MRN: [id number] DOB:1951-01-22, 72 y.o., female Today's Date: 03/09/2023   PCP: Elfredia Nevins, MD REFERRING PROVIDER: Elfredia Nevins, MD  END OF SESSION:  PT End of Session - 03/09/23 1222     Visit Number 14    Number of Visits 23    Date for PT Re-Evaluation 04/14/23    Authorization Type [employer]    Authorization Time Period 12 visits approved from 10/4 thr 11/15    Authorization - Visit Number 3    Authorization - Number of Visits 12    Progress Note Due on Visit 23    PT Start Time 1145    PT Stop Time 1220    PT Time Calculation (min) 35 min    Activity Tolerance Patient tolerated treatment well    Behavior During Therapy WFL for tasks assessed/performed               Past Medical History:  Diagnosis Date   Hypertension    Past Surgical History:  Procedure Laterality Date   BREAST SURGERY     lumpectomy   CYSTOSCOPY WITH STENT PLACEMENT Left 03/09/2017   Procedure: CYSTOSCOPY, RETROGRADE PYELOGRAM WITH STENT PLACEMENT;  Surgeon: Alfredo Martinez, MD;  Location: [facility];  Service: Urology;  Laterality: Left;   Patient Active Problem List   Diagnosis Date Noted   Sepsis (HCC) 03/09/2017   Ureteral stone with hydronephrosis    Hypertension 03/08/2017   Sepsis secondary to UTI (HCC) 03/08/2017   Urinary tract obstruction by kidney stone 03/08/2017    ONSET DATE: 1 month approx.  REFERRING DIAG: L03.119 (ICD-10-CM) - Cellulitis of unspecified part of limb I83.009 (ICD-10-CM) - Varicose veins of unspecified lower extremity with ulcer of unspecified site I87.2 (ICD-10-CM) - Venous insufficiency (chronic) (peripheral)  THERAPY DIAG:  Other abnormalities of gait and mobility  Peripheral venous insufficiency  Rationale for Evaluation and Treatment: Rehabilitation   Wound Therapy - 03/09/23 0001     Subjective PT has no concerns or pain.    Patient and Family Stated Goals  wound to heal    Date of Onset 12/19/22    Prior Treatments antibiotics    Pain Score 0-No pain    Evaluation and Treatment Procedures Explained to Patient/Family Yes    Evaluation and Treatment Procedures agreed to    Wound Properties Date First Assessed: 01/19/23 Time First Assessed: 1345 Wound Type: Venous stasis ulcer Location: Pretibial Location Orientation: Left Wound Description (Comments): anterior tibial wound Present on Admission: Yes   Dressing Type Gauze (Comment);Compression wrap    Dressing Changed Changed    Dressing Status Old drainage    Dressing Change Frequency PRN    Site / Wound Assessment Yellow;Red;Painful    % Wound base Red or Granulating 95%    % Wound base Yellow/Fibrinous Exudate 5%    Peri-wound Assessment Erythema (non-blanchable)    Wound Length (cm) 2 cm    Wound Width (cm) --   varies   Wound Depth (cm) 0 cm    Drainage Amount Scant    Drainage Description Serous    Treatment Cleansed;Debridement (Selective)    Selective Debridement (non-excisional) - Location anterior tibial region wound    Selective Debridement (non-excisional) - Tools Used Scalpel    Selective Debridement (non-excisional) - Tissue Removed slough    Wound Therapy - Clinical Statement see below    Wound Therapy - Functional Problem List bathing    Factors Delaying/Impairing Wound Healing Vascular  compromise    Hydrotherapy Plan Debridement;Dressing change;Electrical stimulation;Patient/family education;Pulsatile lavage with suction;Ultrasonic wound therapy @35  KHz (+/- 3 KHz)    Wound Therapy - Frequency 2X / week    Wound Therapy - Current Recommendations PT    Wound Plan debride and dressing changes PRN    Dressing  lotion to LLE, xeroform,  cotton, elastic bandage f/b coban and netting.  n and #5 netting.                PATIENT EDUCATION: Education details: Patient educated on exam findings, POC, scope of PT, dressing changes if soiled. Person educated:  Patient Education method: Explanation Education comprehension: verbalized understanding   HOME EXERCISE PROGRAM:  Ankle pumps  GOALS: Goals reviewed with patient? Yes  SHORT TERM GOALS: Target date: 02/09/2023    Wound to decrease in size by half to demonstrate wound healing. Baseline: Goal status: MET  2.  Wound to be free from slough.  Baseline:  Goal status: MET    LONG TERM GOALS: Target date: 03/02/2023    Wound to be healed to reduce risk of infection Baseline:  Goal status: IN PROGRESS   ASSESSMENT:  CLINICAL IMPRESSION: Wound much improved with noted improved granulation.  Therapist requested pt to bring compression garment with her next treatment in case pt is able to be discharged.  .  Pt reported overall comfort with dressing.    OBJECTIVE IMPAIRMENTS: Abnormal gait, decreased activity tolerance, decreased balance, decreased endurance, increased edema, improper body mechanics, and pain.   ACTIVITY LIMITATIONS: bending, standing, squatting, bathing, hygiene/grooming, locomotion level, and caring for others  PARTICIPATION LIMITATIONS: meal prep, cleaning, laundry, shopping, community activity, and yard work  PERSONAL FACTORS: Fitness, Past/current experiences, Time since onset of injury/illness/exacerbation, and 3+ comorbidities: varicose veins, HTN , venous insufficiency  are also affecting patient's functional outcome.   REHAB POTENTIAL: Good  CLINICAL DECISION MAKING: Stable/uncomplicated  EVALUATION COMPLEXITY: Low  PLAN: PT FREQUENCY: 2x/week  PT DURATION: 6 weeks  PLANNED INTERVENTIONS: Therapeutic exercises, Therapeutic activity, Neuromuscular re-education, Balance training, Gait training, Patient/Family education, Joint manipulation, Joint mobilization, Stair training, Orthotic/Fit training, DME instructions, Aquatic Therapy, Dry Needling, Electrical stimulation, Spinal manipulation, Spinal mobilization, Cryotherapy, Moist heat, Compression  bandaging, scar mobilization, Splintting, Taping, Traction, Ultrasound, Ionotophoresis 4mg /ml Dexamethasone, and Manual therapy   PLAN FOR NEXT SESSION: debride and dressing changes PRN.  Measure/photo weekly.  See how wound responds to xeroform. Virgina Organ, PT CLT [phone]  03/09/2023, 12:26 PM

## 2023-03-14 ENCOUNTER — Ambulatory Visit (HOSPITAL_COMMUNITY): Payer: Medicare PPO | Admitting: Physical Therapy

## 2023-03-14 DIAGNOSIS — I872 Venous insufficiency (chronic) (peripheral): Secondary | ICD-10-CM | POA: Diagnosis not present

## 2023-03-14 DIAGNOSIS — R2689 Other abnormalities of gait and mobility: Secondary | ICD-10-CM

## 2023-03-14 NOTE — Therapy (Signed)
OUTPATIENT PHYSICAL THERAPY WOUND TREATMENT  Patient Name: Jody Hernandez MRN: [id number] DOB:Oct 25, 1950, 72 y.o., female Today's Date: 03/14/2023   PCP: Elfredia Nevins, MD REFERRING PROVIDER: Elfredia Nevins, MD  END OF SESSION:  PT End of Session - 03/14/23 0839     Visit Number 15    Number of Visits 23    Date for PT Re-Evaluation 04/14/23    Authorization Type [employer]    Authorization Time Period 12 visits approved from 10/4 thr 11/15    Authorization - Visit Number 4    Authorization - Number of Visits 12    Progress Note Due on Visit 23    PT Start Time 0813    PT Stop Time 0840    PT Time Calculation (min) 27 min    Activity Tolerance Patient tolerated treatment well    Behavior During Therapy WFL for tasks assessed/performed               Past Medical History:  Diagnosis Date   Hypertension    Past Surgical History:  Procedure Laterality Date   BREAST SURGERY     lumpectomy   CYSTOSCOPY WITH STENT PLACEMENT Left 03/09/2017   Procedure: CYSTOSCOPY, RETROGRADE PYELOGRAM WITH STENT PLACEMENT;  Surgeon: Alfredo Martinez, MD;  Location: [facility];  Service: Urology;  Laterality: Left;   Patient Active Problem List   Diagnosis Date Noted   Sepsis (HCC) 03/09/2017   Ureteral stone with hydronephrosis    Hypertension 03/08/2017   Sepsis secondary to UTI (HCC) 03/08/2017   Urinary tract obstruction by kidney stone 03/08/2017    ONSET DATE: 1 month approx.  REFERRING DIAG: L03.119 (ICD-10-CM) - Cellulitis of unspecified part of limb I83.009 (ICD-10-CM) - Varicose veins of unspecified lower extremity with ulcer of unspecified site I87.2 (ICD-10-CM) - Venous insufficiency (chronic) (peripheral)  THERAPY DIAG:  Other abnormalities of gait and mobility  Peripheral venous insufficiency  Rationale for Evaluation and Treatment: Rehabilitation   Wound Therapy - 03/14/23 0001     Subjective PT has no concerns or pain.    Patient and Family Stated Goals  wound to heal    Date of Onset 12/19/22    Prior Treatments antibiotics    Pain Score 0-No pain    Evaluation and Treatment Procedures Explained to Patient/Family Yes    Evaluation and Treatment Procedures agreed to    Wound Properties Date First Assessed: 01/19/23 Time First Assessed: 1345 Wound Type: Venous stasis ulcer Location: Pretibial Location Orientation: Left Wound Description (Comments): anterior tibial wound Present on Admission: Yes   Dressing Type Gauze (Comment);Compression wrap    Dressing Changed Changed    Dressing Status Old drainage    Dressing Change Frequency PRN    Site / Wound Assessment Yellow;Red;Painful    % Wound base Red or Granulating --   97   % Wound base Yellow/Fibrinous Exudate --   3   Peri-wound Assessment Erythema (non-blanchable)    Wound Length (cm) 1.5 cm    Wound Width (cm) --   varies   Drainage Amount Scant    Drainage Description Serous    Treatment Debridement (Selective);Cleansed    Selective Debridement (non-excisional) - Location anterior tibial region wound    Selective Debridement (non-excisional) - Tools Used Scalpel    Selective Debridement (non-excisional) - Tissue Removed slough    Wound Therapy - Clinical Statement see below    Wound Therapy - Functional Problem List bathing    Factors Delaying/Impairing Wound Healing Vascular compromise  Hydrotherapy Plan Debridement;Dressing change;Electrical stimulation;Patient/family education;Pulsatile lavage with suction;Ultrasonic wound therapy @35  KHz (+/- 3 KHz)    Wound Therapy - Frequency 2X / week    Wound Therapy - Current Recommendations PT    Wound Plan debride and dressing changes PRN    Dressing  lotion to LLE, xeroform,  cotton, elastic bandage f/b coban and netting.  n and #5 netting.                PATIENT EDUCATION: Education details: Patient educated on exam findings, POC, scope of PT, dressing changes if soiled. Person educated: Patient Education method:  Explanation Education comprehension: verbalized understanding   HOME EXERCISE PROGRAM:  Ankle pumps  GOALS: Goals reviewed with patient? Yes  SHORT TERM GOALS: Target date: 02/09/2023    Wound to decrease in size by half to demonstrate wound healing. Baseline: Goal status: MET  2.  Wound to be free from slough.  Baseline:  Goal status: MET    LONG TERM GOALS: Target date: 03/02/2023    Wound to be healed to reduce risk of infection Baseline:  Goal status: IN PROGRESS   ASSESSMENT:  CLINICAL IMPRESSION:  PT comes to department with compression on her RT leg.  Wound continues to approximate but still has several open areas.  Significant improved granulation.    Pt reported overall comfort with dressing.    OBJECTIVE IMPAIRMENTS: Abnormal gait, decreased activity tolerance, decreased balance, decreased endurance, increased edema, improper body mechanics, and pain.   ACTIVITY LIMITATIONS: bending, standing, squatting, bathing, hygiene/grooming, locomotion level, and caring for others  PARTICIPATION LIMITATIONS: meal prep, cleaning, laundry, shopping, community activity, and yard work  PERSONAL FACTORS: Fitness, Past/current experiences, Time since onset of injury/illness/exacerbation, and 3+ comorbidities: varicose veins, HTN , venous insufficiency  are also affecting patient's functional outcome.   REHAB POTENTIAL: Good  CLINICAL DECISION MAKING: Stable/uncomplicated  EVALUATION COMPLEXITY: Low  PLAN: PT FREQUENCY: 2x/week  PT DURATION: 6 weeks  PLANNED INTERVENTIONS: Therapeutic exercises, Therapeutic activity, Neuromuscular re-education, Balance training, Gait training, Patient/Family education, Joint manipulation, Joint mobilization, Stair training, Orthotic/Fit training, DME instructions, Aquatic Therapy, Dry Needling, Electrical stimulation, Spinal manipulation, Spinal mobilization, Cryotherapy, Moist heat, Compression bandaging, scar mobilization, Splintting,  Taping, Traction, Ultrasound, Ionotophoresis 4mg /ml Dexamethasone, and Manual therapy   PLAN FOR NEXT SESSION: Wound is almost healed.  PT will bring compression garment in each visit as pt will most probably be discharged within a week.  Virgina Organ, PT CLT [phone]  03/14/2023, 8:43 AM

## 2023-03-17 ENCOUNTER — Ambulatory Visit (HOSPITAL_COMMUNITY): Payer: Medicare PPO | Admitting: Physical Therapy

## 2023-03-17 DIAGNOSIS — I872 Venous insufficiency (chronic) (peripheral): Secondary | ICD-10-CM

## 2023-03-17 DIAGNOSIS — R2689 Other abnormalities of gait and mobility: Secondary | ICD-10-CM

## 2023-03-17 NOTE — Therapy (Signed)
OUTPATIENT PHYSICAL THERAPY WOUND TREATMENT  Patient Name: Jody Hernandez MRN: [id number] DOB:[birth year], 72 y.o., female Today's Date: 03/17/2023   PCP: Elfredia Nevins, MD REFERRING PROVIDER: Elfredia Nevins, MD  END OF SESSION:  PT End of Session - 03/17/23 0928     Visit Number 16    Number of Visits 23    Date for PT Re-Evaluation 04/14/23    Authorization Type [employer]    Authorization Time Period 12 visits approved from 10/4 thr 11/15    Authorization - Visit Number 5    Authorization - Number of Visits 12    Progress Note Due on Visit 23    PT Start Time 0845    PT Stop Time 0920    PT Time Calculation (min) 35 min    Activity Tolerance Patient tolerated treatment well    Behavior During Therapy WFL for tasks assessed/performed               Past Medical History:  Diagnosis Date   Hypertension    Past Surgical History:  Procedure Laterality Date   BREAST SURGERY     lumpectomy   CYSTOSCOPY WITH STENT PLACEMENT Left 03/09/2017   Procedure: CYSTOSCOPY, RETROGRADE PYELOGRAM WITH STENT PLACEMENT;  Surgeon: Alfredo Martinez, MD;  Location: [facility];  Service: Urology;  Laterality: Left;   Patient Active Problem List   Diagnosis Date Noted   Sepsis (HCC) 03/09/2017   Ureteral stone with hydronephrosis    Hypertension 03/08/2017   Sepsis secondary to UTI (HCC) 03/08/2017   Urinary tract obstruction by kidney stone 03/08/2017    ONSET DATE: 1 month approx.  REFERRING DIAG: L03.119 (ICD-10-CM) - Cellulitis of unspecified part of limb I83.009 (ICD-10-CM) - Varicose veins of unspecified lower extremity with ulcer of unspecified site I87.2 (ICD-10-CM) - Venous insufficiency (chronic) (peripheral)  THERAPY DIAG:  Other abnormalities of gait and mobility  Peripheral venous insufficiency  Rationale for Evaluation and Treatment: Rehabilitation   Wound Therapy - 03/17/23 0001     Subjective PT states that the dressing was a  little snug.    Patient and  Family Stated Goals wound to heal    Date of Onset 12/19/22    Prior Treatments antibiotics    Pain Score 0-No pain    Evaluation and Treatment Procedures Explained to Patient/Family Yes    Evaluation and Treatment Procedures agreed to    Wound Properties Date First Assessed: 01/19/23 Time First Assessed: 1345 Wound Type: Venous stasis ulcer Location: Pretibial Location Orientation: Left Wound Description (Comments): anterior tibial wound Present on Admission: Yes   Wound Image Images linked: 1    Dressing Type Gauze (Comment);Compression wrap    Dressing Changed Changed    Dressing Status Old drainage    Dressing Change Frequency PRN    Site / Wound Assessment Yellow;Red;Painful    % Wound base Red or Granulating --   98   % Wound base Yellow/Fibrinous Exudate --   2   Peri-wound Assessment Erythema (non-blanchable)    Wound Length (cm) 2.3 cm    Wound Width (cm) 1 cm    Wound Surface Area (cm^2) 2.3 cm^2    Drainage Amount Scant    Drainage Description Serous    Treatment Cleansed;Debridement (Selective)    Selective Debridement (non-excisional) - Location anterior tibial region wound    Selective Debridement (non-excisional) - Tools Used Scalpel    Selective Debridement (non-excisional) - Tissue Removed slough    Wound Therapy - Clinical Statement see below  Wound Therapy - Functional Problem List bathing    Factors Delaying/Impairing Wound Healing Vascular compromise    Hydrotherapy Plan Debridement;Dressing change;Electrical stimulation;Patient/family education;Pulsatile lavage with suction;Ultrasonic wound therapy @35  KHz (+/- 3 KHz)    Wound Therapy - Frequency 2X / week    Wound Therapy - Current Recommendations PT    Wound Plan debride and dressing changes PRN    Dressing  medihoney 2x2 and profore lite followed by netting.                PATIENT EDUCATION: Education details: Patient educated on exam findings, POC, scope of PT, dressing changes if  soiled. Person educated: Patient Education method: Explanation Education comprehension: verbalized understanding   HOME EXERCISE PROGRAM:  Ankle pumps  GOALS: Goals reviewed with patient? Yes  SHORT TERM GOALS: Target date: 02/09/2023    Wound to decrease in size by half to demonstrate wound healing. Baseline: Goal status: MET  2.  Wound to be free from slough.  Baseline:  Goal status: MET    LONG TERM GOALS: Target date: 03/02/2023    Wound to be healed to reduce risk of infection Baseline:  Goal status: IN PROGRESS   ASSESSMENT:  CLINICAL IMPRESSION:  Pt currently only has one main wound.  Area around wound continues to be red and the size appears to be larger even though granulation continues to improve.  Therapist returned to profore lite and urged pt to make more appointments.  Pt has had difficulty getting this wound to heal and therapist desires wound to be completely healed prior to discharge.  .    OBJECTIVE IMPAIRMENTS: Abnormal gait, decreased activity tolerance, decreased balance, decreased endurance, increased edema, improper body mechanics, and pain.   ACTIVITY LIMITATIONS: bending, standing, squatting, bathing, hygiene/grooming, locomotion level, and caring for others  PARTICIPATION LIMITATIONS: meal prep, cleaning, laundry, shopping, community activity, and yard work  PERSONAL FACTORS: Fitness, Past/current experiences, Time since onset of injury/illness/exacerbation, and 3+ comorbidities: varicose veins, HTN , venous insufficiency  are also affecting patient's functional outcome.   REHAB POTENTIAL: Good  CLINICAL DECISION MAKING: Stable/uncomplicated  EVALUATION COMPLEXITY: Low  PLAN: PT FREQUENCY: 2x/week  PT DURATION: 6 weeks  PLANNED INTERVENTIONS: Therapeutic exercises, Therapeutic activity, Neuromuscular re-education, Balance training, Gait training, Patient/Family education, Joint manipulation, Joint mobilization, Stair training,  Orthotic/Fit training, DME instructions, Aquatic Therapy, Dry Needling, Electrical stimulation, Spinal manipulation, Spinal mobilization, Cryotherapy, Moist heat, Compression bandaging, scar mobilization, Splintting, Taping, Traction, Ultrasound, Ionotophoresis 4mg /ml Dexamethasone, and Manual therapy   PLAN FOR NEXT SESSION: Wound is almost healed.  PT will bring compression garment in each visit as pt will most probably be discharged within a week.  Virgina Organ, PT CLT [phone]  03/17/2023, 9:31 AM

## 2023-03-21 ENCOUNTER — Ambulatory Visit (HOSPITAL_COMMUNITY): Payer: Medicare PPO | Admitting: Physical Therapy

## 2023-03-21 DIAGNOSIS — R2689 Other abnormalities of gait and mobility: Secondary | ICD-10-CM

## 2023-03-21 DIAGNOSIS — I872 Venous insufficiency (chronic) (peripheral): Secondary | ICD-10-CM | POA: Diagnosis not present

## 2023-03-21 NOTE — Therapy (Signed)
OUTPATIENT PHYSICAL THERAPY WOUND TREATMENT  Patient Name: Jody Hernandez MRN: [id number] DOB:17-Oct-1950, 72 y.o., female Today's Date: 03/21/2023   PCP: Elfredia Nevins, MD REFERRING PROVIDER: Elfredia Nevins, MD  END OF SESSION:  PT End of Session - 03/21/23 1347     Visit Number 17    Number of Visits 23    Date for PT Re-Evaluation 04/14/23    Authorization Type [employer]    Authorization Time Period 12 visits approved from 10/4 thr 11/15    Authorization - Number of Visits 12    Progress Note Due on Visit 23    PT Start Time 1150    PT Stop Time 1225    PT Time Calculation (min) 35 min    Activity Tolerance Patient tolerated treatment well    Behavior During Therapy WFL for tasks assessed/performed               Past Medical History:  Diagnosis Date   Hypertension    Past Surgical History:  Procedure Laterality Date   BREAST SURGERY     lumpectomy   CYSTOSCOPY WITH STENT PLACEMENT Left 03/09/2017   Procedure: CYSTOSCOPY, RETROGRADE PYELOGRAM WITH STENT PLACEMENT;  Surgeon: Alfredo Martinez, MD;  Location: [facility];  Service: Urology;  Laterality: Left;   Patient Active Problem List   Diagnosis Date Noted   Sepsis (HCC) 03/09/2017   Ureteral stone with hydronephrosis    Hypertension 03/08/2017   Sepsis secondary to UTI (HCC) 03/08/2017   Urinary tract obstruction by kidney stone 03/08/2017    ONSET DATE: 1 month approx.  REFERRING DIAG: L03.119 (ICD-10-CM) - Cellulitis of unspecified part of limb I83.009 (ICD-10-CM) - Varicose veins of unspecified lower extremity with ulcer of unspecified site I87.2 (ICD-10-CM) - Venous insufficiency (chronic) (peripheral)  THERAPY DIAG:  Other abnormalities of gait and mobility  Peripheral venous insufficiency  Rationale for Evaluation and Treatment: Rehabilitation   Wound Therapy - 03/21/23 1347     Subjective pt reports she is doing well today, kept the dressing on.  Itches sometimes    Patient and Family  Stated Goals wound to heal    Date of Onset 12/19/22    Prior Treatments antibiotics    Evaluation and Treatment Procedures Explained to Patient/Family Yes    Evaluation and Treatment Procedures agreed to    Wound Properties Date First Assessed: 01/19/23 Time First Assessed: 1345 Wound Type: Venous stasis ulcer Location: Pretibial Location Orientation: Left Wound Description (Comments): anterior tibial wound Present on Admission: Yes   Dressing Type Gauze (Comment);Compression wrap    Dressing Changed Changed    Dressing Status Old drainage    Dressing Change Frequency PRN    Site / Wound Assessment Yellow;Red;Painful    % Wound base Red or Granulating 95%    % Wound base Yellow/Fibrinous Exudate 5%    Peri-wound Assessment Erythema (non-blanchable)    Drainage Amount Minimal    Drainage Description Serous;Sanguineous    Treatment Cleansed;Debridement (Selective)    Selective Debridement (non-excisional) - Location anterior tibial region wound    Selective Debridement (non-excisional) - Tools Used Scalpel    Selective Debridement (non-excisional) - Tissue Removed slough    Wound Therapy - Clinical Statement see below    Wound Therapy - Functional Problem List bathing    Factors Delaying/Impairing Wound Healing Vascular compromise    Hydrotherapy Plan Debridement;Dressing change;Electrical stimulation;Patient/family education;Pulsatile lavage with suction;Ultrasonic wound therapy @35  KHz (+/- 3 KHz)    Wound Therapy - Frequency 2X / week  Wound Therapy - Current Recommendations PT    Wound Plan debride and dressing changes PRN    Dressing  xeroform, 4X4, kerlix, coban, #5 netting.  Lotion followed by vaseline.                PATIENT EDUCATION: Education details: Patient educated on exam findings, POC, scope of PT, dressing changes if soiled. Person educated: Patient Education method: Explanation Education comprehension: verbalized understanding   HOME EXERCISE  PROGRAM:  Ankle pumps  GOALS: Goals reviewed with patient? Yes  SHORT TERM GOALS: Target date: 02/09/2023    Wound to decrease in size by half to demonstrate wound healing. Baseline: Goal status: MET  2.  Wound to be free from slough.  Baseline:  Goal status: MET    LONG TERM GOALS: Target date: 03/02/2023    Wound to be healed to reduce risk of infection Baseline:  Goal status: IN PROGRESS   ASSESSMENT:  CLINICAL IMPRESSION:  Small eruptions disbursed around larger wound in general area with some purulent drainage present.  One area of depth within main wound with all others being superficial.  Cleansed and debrided all these areas.  Applied vaseline to keep area moist and changed dressing to xerform for all today.  Moisturized well with lotion prior to vaseline.  Continued with lite compression using kerlix and coban. Pt will continue to benefit from skilled therapy to assist with complete wound healing.     OBJECTIVE IMPAIRMENTS: Abnormal gait, decreased activity tolerance, decreased balance, decreased endurance, increased edema, improper body mechanics, and pain.   ACTIVITY LIMITATIONS: bending, standing, squatting, bathing, hygiene/grooming, locomotion level, and caring for others  PARTICIPATION LIMITATIONS: meal prep, cleaning, laundry, shopping, community activity, and yard work  PERSONAL FACTORS: Fitness, Past/current experiences, Time since onset of injury/illness/exacerbation, and 3+ comorbidities: varicose veins, HTN , venous insufficiency  are also affecting patient's functional outcome.   REHAB POTENTIAL: Good  CLINICAL DECISION MAKING: Stable/uncomplicated  EVALUATION COMPLEXITY: Low  PLAN: PT FREQUENCY: 2x/week  PT DURATION: 6 weeks  PLANNED INTERVENTIONS: Therapeutic exercises, Therapeutic activity, Neuromuscular re-education, Balance training, Gait training, Patient/Family education, Joint manipulation, Joint mobilization, Stair training,  Orthotic/Fit training, DME instructions, Aquatic Therapy, Dry Needling, Electrical stimulation, Spinal manipulation, Spinal mobilization, Cryotherapy, Moist heat, Compression bandaging, scar mobilization, Splintting, Taping, Traction, Ultrasound, Ionotophoresis 4mg /ml Dexamethasone, and Manual therapy   PLAN FOR NEXT SESSION: Wound is slowly healing; instructed to bring compression garment in each visit to transition when ready for discharge.    Lurena Nida, PTA/CLT [facility] Ph: [phone] 03/21/2023, 2:44 PM

## 2023-03-24 ENCOUNTER — Encounter (HOSPITAL_COMMUNITY): Payer: Self-pay

## 2023-03-24 ENCOUNTER — Ambulatory Visit (HOSPITAL_COMMUNITY): Payer: Medicare PPO

## 2023-03-24 DIAGNOSIS — R2689 Other abnormalities of gait and mobility: Secondary | ICD-10-CM | POA: Diagnosis not present

## 2023-03-24 DIAGNOSIS — I872 Venous insufficiency (chronic) (peripheral): Secondary | ICD-10-CM

## 2023-03-24 NOTE — Therapy (Signed)
OUTPATIENT PHYSICAL THERAPY WOUND TREATMENT  Patient Name: Jody Hernandez MRN: [id number] DOB:[birth year], 72 y.o., female Today's Date: 03/24/2023   PCP: Elfredia Nevins, MD REFERRING PROVIDER: Elfredia Nevins, MD  END OF SESSION:  PT End of Session - 03/24/23 1111     Visit Number 18    Number of Visits 23    Date for PT Re-Evaluation 04/14/23    Authorization Type [employer]    Authorization Time Period 12 visits approved from 10/4 thr 11/15    Authorization - Visit Number 7    Authorization - Number of Visits 12    Progress Note Due on Visit 23    PT Start Time 1035    PT Stop Time 1108    PT Time Calculation (min) 33 min    Activity Tolerance Patient tolerated treatment well    Behavior During Therapy WFL for tasks assessed/performed               Past Medical History:  Diagnosis Date   Hypertension    Past Surgical History:  Procedure Laterality Date   BREAST SURGERY     lumpectomy   CYSTOSCOPY WITH STENT PLACEMENT Left 03/09/2017   Procedure: CYSTOSCOPY, RETROGRADE PYELOGRAM WITH STENT PLACEMENT;  Surgeon: Alfredo Martinez, MD;  Location: [facility];  Service: Urology;  Laterality: Left;   Patient Active Problem List   Diagnosis Date Noted   Sepsis (HCC) 03/09/2017   Ureteral stone with hydronephrosis    Hypertension 03/08/2017   Sepsis secondary to UTI (HCC) 03/08/2017   Urinary tract obstruction by kidney stone 03/08/2017    ONSET DATE: 1 month approx.  REFERRING DIAG: L03.119 (ICD-10-CM) - Cellulitis of unspecified part of limb I83.009 (ICD-10-CM) - Varicose veins of unspecified lower extremity with ulcer of unspecified site I87.2 (ICD-10-CM) - Venous insufficiency (chronic) (peripheral)  THERAPY DIAG:  Other abnormalities of gait and mobility  Peripheral venous insufficiency  Rationale for Evaluation and Treatment: Rehabilitation   Wound Therapy - 03/24/23 0001     Subjective Pt arrived with dressings intact.  Reports of comfort  currently.    Patient and Family Stated Goals wound to heal    Date of Onset 12/19/22    Prior Treatments antibiotics    Wound Properties Date First Assessed: 01/19/23 Time First Assessed: 1345 Wound Type: Venous stasis ulcer Location: Pretibial Location Orientation: Left Wound Description (Comments): anterior tibial wound Present on Admission: Yes   Wound Image Images linked: 1    Dressing Type Impregnated gauze (bismuth);Gauze (Comment);Compression wrap    Dressing Changed Changed    Dressing Status Old drainage    Dressing Change Frequency PRN    Site / Wound Assessment Yellow;Red;Painful    % Wound base Red or Granulating 95%    % Wound base Yellow/Fibrinous Exudate 5%    Peri-wound Assessment Erythema (non-blanchable)    Drainage Amount Minimal    Drainage Description Serous;Sanguineous    Treatment Cleansed;Debridement (Selective)    Selective Debridement (non-excisional) - Location anterior tibial region wound    Selective Debridement (non-excisional) - Tools Used Scalpel    Selective Debridement (non-excisional) - Tissue Removed slough    Wound Therapy - Clinical Statement see below    Wound Therapy - Functional Problem List bathing    Factors Delaying/Impairing Wound Healing Vascular compromise    Hydrotherapy Plan Debridement;Dressing change;Electrical stimulation;Patient/family education;Pulsatile lavage with suction;Ultrasonic wound therapy @35  KHz (+/- 3 KHz)    Wound Therapy - Frequency 2X / week    Wound Therapy - Current Recommendations  PT    Wound Plan debride and dressing changes PRN    Dressing  xeroform, 4X4, kerlix, coban, #5 netting.  Lotion followed by vaseline.                 PATIENT EDUCATION: Education details: Patient educated on exam findings, POC, scope of PT, dressing changes if soiled. Person educated: Patient Education method: Explanation Education comprehension: verbalized understanding   HOME EXERCISE PROGRAM:  Ankle  pumps  GOALS: Goals reviewed with patient? Yes  SHORT TERM GOALS: Target date: 02/09/2023    Wound to decrease in size by half to demonstrate wound healing. Baseline: Goal status: MET  2.  Wound to be free from slough.  Baseline:  Goal status: MET    LONG TERM GOALS: Target date: 03/02/2023    Wound to be healed to reduce risk of infection Baseline:  Goal status: IN PROGRESS   ASSESSMENT:  CLINICAL IMPRESSION:   Wound progressing well with decreased redness to area.  Continues to have one area of depth within main wound with all others being superficial.  Selective debridement complete to remove slough and dry skin perimeter to promote healing.  Continued with xeroform and light compression.  No reports of pain through session.    OBJECTIVE IMPAIRMENTS: Abnormal gait, decreased activity tolerance, decreased balance, decreased endurance, increased edema, improper body mechanics, and pain.   ACTIVITY LIMITATIONS: bending, standing, squatting, bathing, hygiene/grooming, locomotion level, and caring for others  PARTICIPATION LIMITATIONS: meal prep, cleaning, laundry, shopping, community activity, and yard work  PERSONAL FACTORS: Fitness, Past/current experiences, Time since onset of injury/illness/exacerbation, and 3+ comorbidities: varicose veins, HTN , venous insufficiency  are also affecting patient's functional outcome.   REHAB POTENTIAL: Good  CLINICAL DECISION MAKING: Stable/uncomplicated  EVALUATION COMPLEXITY: Low  PLAN: PT FREQUENCY: 2x/week  PT DURATION: 6 weeks  PLANNED INTERVENTIONS: Therapeutic exercises, Therapeutic activity, Neuromuscular re-education, Balance training, Gait training, Patient/Family education, Joint manipulation, Joint mobilization, Stair training, Orthotic/Fit training, DME instructions, Aquatic Therapy, Dry Needling, Electrical stimulation, Spinal manipulation, Spinal mobilization, Cryotherapy, Moist heat, Compression bandaging, scar  mobilization, Splintting, Taping, Traction, Ultrasound, Ionotophoresis 4mg /ml Dexamethasone, and Manual therapy   PLAN FOR NEXT SESSION: Wound is slowly healing; instructed to bring compression garment in each visit to transition when ready for discharge.    Becky Sax, LPTA/CLT; CBIS [phone]  Juel Burrow, PTA 03/24/2023, 4:28 PM  03/24/2023, 4:28 PM

## 2023-03-28 ENCOUNTER — Ambulatory Visit (HOSPITAL_COMMUNITY): Payer: Medicare PPO | Admitting: Physical Therapy

## 2023-03-28 DIAGNOSIS — I872 Venous insufficiency (chronic) (peripheral): Secondary | ICD-10-CM

## 2023-03-28 DIAGNOSIS — R2689 Other abnormalities of gait and mobility: Secondary | ICD-10-CM | POA: Diagnosis not present

## 2023-03-28 NOTE — Therapy (Signed)
OUTPATIENT PHYSICAL THERAPY WOUND TREATMENT  Patient Name: Jody Hernandez MRN: [id number] DOB:[birth year], 72 y.o., female Today's Date: 03/28/2023   PCP: Elfredia Nevins, MD REFERRING PROVIDER: Elfredia Nevins, MD  END OF SESSION:  PT End of Session - 03/28/23 1051     Visit Number 19    Number of Visits 23    Date for PT Re-Evaluation 04/14/23    Authorization Type [employer]    Authorization Time Period 12 visits approved from 10/4 thr 11/15    Authorization - Visit Number 8    Authorization - Number of Visits 12    Progress Note Due on Visit 23    Activity Tolerance Patient tolerated treatment well    Behavior During Therapy [facility] for tasks assessed/performed               Past Medical History:  Diagnosis Date   Hypertension    Past Surgical History:  Procedure Laterality Date   BREAST SURGERY     lumpectomy   CYSTOSCOPY WITH STENT PLACEMENT Left 03/09/2017   Procedure: CYSTOSCOPY, RETROGRADE PYELOGRAM WITH STENT PLACEMENT;  Surgeon: Alfredo Martinez, MD;  Location: [facility];  Service: Urology;  Laterality: Left;   Patient Active Problem List   Diagnosis Date Noted   Sepsis (HCC) 03/09/2017   Ureteral stone with hydronephrosis    Hypertension 03/08/2017   Sepsis secondary to UTI (HCC) 03/08/2017   Urinary tract obstruction by kidney stone 03/08/2017    ONSET DATE: 1 month approx.  REFERRING DIAG: L03.119 (ICD-10-CM) - Cellulitis of unspecified part of limb I83.009 (ICD-10-CM) - Varicose veins of unspecified lower extremity with ulcer of unspecified site I87.2 (ICD-10-CM) - Venous insufficiency (chronic) (peripheral)  THERAPY DIAG:  Other abnormalities of gait and mobility  Peripheral venous insufficiency  Rationale for Evaluation and Treatment: Rehabilitation   Wound Therapy - 03/28/23 0001     Subjective Pt does not have compression garment donned on Rt LE therapist inquired and pt sates she is unable to get it on by herself and her daughter did  not have time.    Patient and Family Stated Goals wound to heal    Date of Onset 12/19/22    Prior Treatments antibiotics    Pain Score 0-No pain    Evaluation and Treatment Procedures Explained to Patient/Family Yes    Evaluation and Treatment Procedures agreed to    Wound Properties Date First Assessed: 01/19/23 Time First Assessed: 1345 Wound Type: Venous stasis ulcer Location: Pretibial Location Orientation: Left Wound Description (Comments): anterior tibial wound Present on Admission: Yes   Dressing Type Impregnated gauze (bismuth);Gauze (Comment);Compression wrap    Dressing Changed Changed    Dressing Status Old drainage    Dressing Change Frequency PRN    Site / Wound Assessment Yellow;Red;Painful    % Wound base Red or Granulating 100%    % Wound base Yellow/Fibrinous Exudate 0%    Peri-wound Assessment Erythema (non-blanchable)    Wound Length (cm) 2 cm    Wound Width (cm) 0.5 cm    Wound Surface Area (cm^2) 1 cm^2    Drainage Amount Scant    Drainage Description Serous    Treatment Cleansed    Selective Debridement (non-excisional) - Location anterior tibial region wound    Selective Debridement (non-excisional) - Tools Used Forceps    Selective Debridement (non-excisional) - Tissue Removed devitalized tissue    Wound Therapy - Clinical Statement see below    Wound Therapy - Functional Problem List bathing    Factors  Delaying/Impairing Wound Healing Vascular compromise    Hydrotherapy Plan Debridement;Dressing change;Electrical stimulation;Patient/family education;Pulsatile lavage with suction;Ultrasonic wound therapy @35  KHz (+/- 3 KHz)    Wound Therapy - Frequency 2X / week    Wound Therapy - Current Recommendations PT    Wound Plan debride and dressing changes PRN    Dressing  xeroform, 4X4, kerlix, coban, #5 netting.  Lotion followed by vaseline.                 PATIENT EDUCATION: Education details: Patient educated on exam findings, POC, scope of PT,  dressing changes if soiled. Person educated: Patient Education method: Explanation Education comprehension: verbalized understanding   HOME EXERCISE PROGRAM:  Ankle pumps  GOALS: Goals reviewed with patient? Yes  SHORT TERM GOALS: Target date: 02/09/2023    Wound to decrease in size by half to demonstrate wound healing. Baseline: Goal status: MET  2.  Wound to be free from slough.  Baseline:  Goal status: MET    LONG TERM GOALS: Target date: 03/02/2023    Wound to be healed to reduce risk of infection Baseline:  Goal status: IN PROGRESS   ASSESSMENT:  CLINICAL IMPRESSION:   Pt wound depth has improved significantly.  Wound is granulated and continues to approximate.  Significant dead skin removed to allow healthy tissue stay healthy.  Pt has no pain today.      Selective debridement complete to remove slough and dry skin perimeter to promote healing.  Continued with xeroform and light compression.  No reports of pain through session.    OBJECTIVE IMPAIRMENTS: Abnormal gait, decreased activity tolerance, decreased balance, decreased endurance, increased edema, improper body mechanics, and pain.   ACTIVITY LIMITATIONS: bending, standing, squatting, bathing, hygiene/grooming, locomotion level, and caring for others  PARTICIPATION LIMITATIONS: meal prep, cleaning, laundry, shopping, community activity, and yard work  PERSONAL FACTORS: Fitness, Past/current experiences, Time since onset of injury/illness/exacerbation, and 3+ comorbidities: varicose veins, HTN , venous insufficiency  are also affecting patient's functional outcome.   REHAB POTENTIAL: Good  CLINICAL DECISION MAKING: Stable/uncomplicated  EVALUATION COMPLEXITY: Low  PLAN: PT FREQUENCY: 2x/week  PT DURATION: 6 weeks  PLANNED INTERVENTIONS: Therapeutic exercises, Therapeutic activity, Neuromuscular re-education, Balance training, Gait training, Patient/Family education, Joint manipulation, Joint  mobilization, Stair training, Orthotic/Fit training, DME instructions, Aquatic Therapy, Dry Needling, Electrical stimulation, Spinal manipulation, Spinal mobilization, Cryotherapy, Moist heat, Compression bandaging, scar mobilization, Splintting, Taping, Traction, Ultrasound, Ionotophoresis 4mg /ml Dexamethasone, and Manual therapy   PLAN FOR NEXT SESSION: Instruct pt how to don and doff compression stocking.   instructed to bring compression garment in each visit to transition when ready for discharge.   Virgina Organ, PT CLT [phone]  03/28/2023, 10:55 AM

## 2023-03-31 ENCOUNTER — Ambulatory Visit (HOSPITAL_COMMUNITY): Payer: Medicare PPO | Attending: Internal Medicine | Admitting: Physical Therapy

## 2023-03-31 DIAGNOSIS — S81802D Unspecified open wound, left lower leg, subsequent encounter: Secondary | ICD-10-CM | POA: Diagnosis not present

## 2023-03-31 DIAGNOSIS — I872 Venous insufficiency (chronic) (peripheral): Secondary | ICD-10-CM

## 2023-03-31 DIAGNOSIS — R2689 Other abnormalities of gait and mobility: Secondary | ICD-10-CM

## 2023-03-31 NOTE — Therapy (Signed)
OUTPATIENT PHYSICAL THERAPY WOUND TREATMENT/prog  Patient Name: Jody Hernandez MRN: [id number] DOB:1950/09/29, 72 y.o., female Today's Date: 03/31/2023  Progress Note Reporting Period 02/23/23 to 03/31/23  See note below for Objective Data and Assessment of Progress/Goals.     PCP: Elfredia Nevins, MD REFERRING PROVIDER: Elfredia Nevins, MD  END OF SESSION:  PT End of Session - 03/31/23 1015     Visit Number 20    Number of Visits 23    Date for PT Re-Evaluation 04/14/23    Authorization Type [employer]    Authorization Time Period 12 visits approved from 10/4 thr 11/15    Authorization - Visit Number 9    Authorization - Number of Visits 12    Progress Note Due on Visit 23    PT Start Time 0930    PT Stop Time 1015    PT Time Calculation (min) 45 min    Activity Tolerance Patient tolerated treatment well    Behavior During Therapy WFL for tasks assessed/performed               Past Medical History:  Diagnosis Date   Hypertension    Past Surgical History:  Procedure Laterality Date   BREAST SURGERY     lumpectomy   CYSTOSCOPY WITH STENT PLACEMENT Left 03/09/2017   Procedure: CYSTOSCOPY, RETROGRADE PYELOGRAM WITH STENT PLACEMENT;  Surgeon: Alfredo Martinez, MD;  Location: [facility];  Service: Urology;  Laterality: Left;   Patient Active Problem List   Diagnosis Date Noted   Sepsis (HCC) 03/09/2017   Ureteral stone with hydronephrosis    Hypertension 03/08/2017   Sepsis secondary to UTI (HCC) 03/08/2017   Urinary tract obstruction by kidney stone 03/08/2017    ONSET DATE: 1 month approx.  REFERRING DIAG: L03.119 (ICD-10-CM) - Cellulitis of unspecified part of limb I83.009 (ICD-10-CM) - Varicose veins of unspecified lower extremity with ulcer of unspecified site I87.2 (ICD-10-CM) - Venous insufficiency (chronic) (peripheral)  THERAPY DIAG:  Other abnormalities of gait and mobility  Peripheral venous insufficiency  Rationale for Evaluation and  Treatment: Rehabilitation   Wound Therapy - 03/31/23 0001     Subjective Pt does not have compression garment donned on Rt LE therapist inquired and pt sates she is unable to get it on by herself and her daughter did not have time.    Patient and Family Stated Goals wound to heal    Date of Onset 12/19/22    Prior Treatments antibiotics    Pain Score 0-No pain    Evaluation and Treatment Procedures Explained to Patient/Family Yes    Evaluation and Treatment Procedures agreed to    Wound Properties Date First Assessed: 01/19/23 Time First Assessed: 1345 Wound Type: Venous stasis ulcer Location: Pretibial Location Orientation: Left Wound Description (Comments): anterior tibial wound Present on Admission: Yes   Wound Image Images linked: 1    Dressing Type Impregnated gauze (bismuth);Gauze (Comment);Compression wrap    Dressing Status Old drainage    Dressing Change Frequency PRN    Site / Wound Assessment Yellow;Red;Painful    % Wound base Red or Granulating 100%   was 80%   % Wound base Yellow/Fibrinous Exudate 0%    Peri-wound Assessment Erythema (non-blanchable)    Wound Length (cm) 2 cm   was 2.7   Wound Width (cm) 0.6 cm   was 20%was  2   Wound Surface Area (cm^2) 1.2 cm^2    Drainage Amount Scant    Drainage Description Serous    Treatment Cleansed  Selective Debridement (non-excisional) - Location anterior tibial region wound    Selective Debridement (non-excisional) - Tools Used Forceps    Selective Debridement (non-excisional) - Tissue Removed devitalized tissue    Wound Therapy - Clinical Statement see below    Wound Therapy - Functional Problem List bathing    Factors Delaying/Impairing Wound Healing Vascular compromise    Hydrotherapy Plan Debridement;Dressing change;Electrical stimulation;Patient/family education;Pulsatile lavage with suction;Ultrasonic wound therapy @35  KHz (+/- 3 KHz)    Wound Therapy - Frequency 1X / week    Wound Therapy - Current Recommendations PT     Wound Plan debride and dressing changes PRN    Dressing  medihoney , 4X4, kerlix, coban, #5 netting.  Lotion followed by vaseline.             Therapist reviewed donning and doffing compression garment with pt multiple times including using a butler as pt is still having difficulty  Completing this at home.  Therapist advised pt to bring her butler in next treatment as pt states that her butler is slightly different than the  One we have at the clinic.     PATIENT EDUCATION: Education details: Patient educated on exam findings, POC, scope of PT, dressing changes if soiled. Person educated: Patient Education method: Explanation Education comprehension: verbalized understanding   HOME EXERCISE PROGRAM:  Ankle pumps  GOALS: Goals reviewed with patient? Yes  SHORT TERM GOALS: Target date: 02/09/2023    Wound to decrease in size by half to demonstrate wound healing. Baseline: Goal status: MET  2.  Wound to be free from slough.  Baseline:  Goal status: MET    LONG TERM GOALS: Target date: 03/02/2023    Wound to be healed to reduce risk of infection Baseline:  Goal status: IN PROGRESS   ASSESSMENT:  CLINICAL IMPRESSION:   PT wound remains granulated and has not need for debridement.  PT now has an abrasion area along the anterior aspect of her tibia measuring 3x1.2 cm.  Pt will be decreased to one time a week until wound is totally healed.  PT will need continued skilled therapy for educating pt on self care as well as keeping compression on LE  OBJECTIVE IMPAIRMENTS: Abnormal gait, decreased activity tolerance, decreased balance, decreased endurance, increased edema, improper body mechanics, and pain.   ACTIVITY LIMITATIONS: bending, standing, squatting, bathing, hygiene/grooming, locomotion level, and caring for others  PARTICIPATION LIMITATIONS: meal prep, cleaning, laundry, shopping, community activity, and yard work  PERSONAL FACTORS: Fitness, Past/current  experiences, Time since onset of injury/illness/exacerbation, and 3+ comorbidities: varicose veins, HTN , venous insufficiency  are also affecting patient's functional outcome.   REHAB POTENTIAL: Good  CLINICAL DECISION MAKING: Stable/uncomplicated  EVALUATION COMPLEXITY: Low  PLAN: PT FREQUENCY: 2x/week  PT DURATION: 6 weeks  PLANNED INTERVENTIONS: Therapeutic exercises, Therapeutic activity, Neuromuscular re-education, Balance training, Gait training, Patient/Family education, Joint manipulation, Joint mobilization, Stair training, Orthotic/Fit training, DME instructions, Aquatic Therapy, Dry Needling, Electrical stimulation, Spinal manipulation, Spinal mobilization, Cryotherapy, Moist heat, Compression bandaging, scar mobilization, Splintting, Taping, Traction, Ultrasound, Ionotophoresis 4mg /ml Dexamethasone, and Manual therapy   PLAN FOR NEXT SESSION: Continues to Instruct pt how to don and doff compression stocking.   instructed to bring [facility] in for instruction.  Virgina Organ, PT CLT [phone]  03/31/2023, 10:59 AM

## 2023-04-06 ENCOUNTER — Telehealth (HOSPITAL_COMMUNITY): Payer: Self-pay | Admitting: Physical Therapy

## 2023-04-06 ENCOUNTER — Ambulatory Visit (HOSPITAL_COMMUNITY): Payer: Medicare PPO | Admitting: Physical Therapy

## 2023-04-06 DIAGNOSIS — S81802D Unspecified open wound, left lower leg, subsequent encounter: Secondary | ICD-10-CM

## 2023-04-06 DIAGNOSIS — I872 Venous insufficiency (chronic) (peripheral): Secondary | ICD-10-CM

## 2023-04-06 DIAGNOSIS — R2689 Other abnormalities of gait and mobility: Secondary | ICD-10-CM | POA: Diagnosis not present

## 2023-04-06 NOTE — Addendum Note (Signed)
Addended by: Bella Kennedy on: 04/06/2023 02:45 PM   Modules accepted: Orders

## 2023-04-06 NOTE — Telephone Encounter (Signed)
Pt did not show for appt.  Called both numbers listed for patient and was informed "call cannot be completed at this time".  Called spouses number and left a message regarding missed appt and need to reschedule as she has no visits remaining.  Lurena Nida, PTA/CLT [facility] Ph: [phone]

## 2023-04-06 NOTE — Therapy (Signed)
OUTPATIENT PHYSICAL THERAPY WOUND TREATMENT Patient Name: Jody Hernandez MRN: [id number] DOB:[birth year], 72 y.o., female Today's Date: 04/06/2023    PCP: Elfredia Nevins, MD REFERRING PROVIDER: Elfredia Nevins, MD  END OF SESSION:  PT End of Session - 04/06/23 1505     Visit Number 21    Number of Visits 30    Date for PT Re-Evaluation 04/14/23    Authorization Type [employer]    Authorization Time Period 12 visits approved from 10/4 thr 11/15    Authorization - Visit Number 10    Authorization - Number of Visits 12    Progress Note Due on Visit 23    PT Start Time 1505    Activity Tolerance Patient tolerated treatment well    Behavior During Therapy [facility] for tasks assessed/performed               Past Medical History:  Diagnosis Date   Hypertension    Past Surgical History:  Procedure Laterality Date   BREAST SURGERY     lumpectomy   CYSTOSCOPY WITH STENT PLACEMENT Left 03/09/2017   Procedure: CYSTOSCOPY, RETROGRADE PYELOGRAM WITH STENT PLACEMENT;  Surgeon: Alfredo Martinez, MD;  Location: [facility];  Service: Urology;  Laterality: Left;   Patient Active Problem List   Diagnosis Date Noted   Sepsis (HCC) 03/09/2017   Ureteral stone with hydronephrosis    Hypertension 03/08/2017   Sepsis secondary to UTI (HCC) 03/08/2017   Urinary tract obstruction by kidney stone 03/08/2017    ONSET DATE: 1 month approx.  REFERRING DIAG: L03.119 (ICD-10-CM) - Cellulitis of unspecified part of limb I83.009 (ICD-10-CM) - Varicose veins of unspecified lower extremity with ulcer of unspecified site I87.2 (ICD-10-CM) - Venous insufficiency (chronic) (peripheral)  THERAPY DIAG:  Other abnormalities of gait and mobility  Peripheral venous insufficiency  Multiple open wounds of lower leg, left, subsequent encounter  Rationale for Evaluation and Treatment: Rehabilitation   Wound Therapy - 04/06/23 1554     Subjective pt did not show for earlier appt and came in pm requesting  to be seen.  Dressing still intact.    Patient and Family Stated Goals wound to heal    Date of Onset 12/19/22    Prior Treatments antibiotics    Pain Score 0-No pain    Evaluation and Treatment Procedures Explained to Patient/Family Yes    Evaluation and Treatment Procedures agreed to    Wound Properties Date First Assessed: 01/19/23 Time First Assessed: 1345 Wound Type: Venous stasis ulcer Location: Pretibial Location Orientation: Left Wound Description (Comments): anterior tibial wound Present on Admission: Yes   Wound Image Images linked: 1    Dressing Type Impregnated gauze (bismuth);Gauze (Comment);Compression wrap    Dressing Changed Changed    Dressing Status Old drainage    Dressing Change Frequency PRN    Site / Wound Assessment Yellow;Red;Painful    % Wound base Red or Granulating 100%    % Wound base Yellow/Fibrinous Exudate 0%    Peri-wound Assessment Erythema (non-blanchable)    Wound Length (cm) 1.5 cm    Wound Width (cm) 0.8 cm    Wound Depth (cm) 0.1 cm    Wound Volume (cm^3) 0.12 cm^3    Wound Surface Area (cm^2) 1.2 cm^2    Drainage Amount Scant    Drainage Description Serous    Treatment Cleansed;Debridement (Selective)    Selective Debridement (non-excisional) - Location anterior tibial region wound    Selective Debridement (non-excisional) - Tools Used Forceps    Selective Debridement (  non-excisional) - Tissue Removed devitalized tissue    Wound Therapy - Clinical Statement see below    Wound Therapy - Functional Problem List bathing    Factors Delaying/Impairing Wound Healing Vascular compromise    Hydrotherapy Plan Debridement;Dressing change;Electrical stimulation;Patient/family education;Pulsatile lavage with suction;Ultrasonic wound therapy @35  KHz (+/- 3 KHz)    Wound Therapy - Frequency 2X / week    Wound Therapy - Current Recommendations PT    Wound Plan debride and dressing changes PRN    Dressing  xeroform , 4X4, kerlix, coban, #5 netting.  Lotion  followed by vaseline.                PATIENT EDUCATION: Education details: Patient educated on exam findings, POC, scope of PT, dressing changes if soiled. Person educated: Patient Education method: Explanation Education comprehension: verbalized understanding   HOME EXERCISE PROGRAM:  Ankle pumps  GOALS: Goals reviewed with patient? Yes  SHORT TERM GOALS: Target date: 02/09/2023    Wound to decrease in size by half to demonstrate wound healing. Baseline: Goal status: MET  2.  Wound to be free from slough.  Baseline:  Goal status: IN PROGRESS    LONG TERM GOALS: Target date: 03/02/2023    Wound to be healed to reduce risk of infection Baseline:  Goal status: IN PROGRESS   ASSESSMENT:  CLINICAL IMPRESSION:   Wound measured and photographed today.  Wound is overall approximating, did have slough in wound bed that required debridement exposing some slight depth still. Cleansed and moisturized LE well and changed dressing to xeroform to encouraged moisture.  Continued with coban for compression (out of profore kits).  PT reported overall comfort.  Instructed to make 1X week appts for 2 more weeks and feel wound will be healed at this point.  PT will need continued skilled therapy for educating pt on self care as well as keeping compression on LE   OBJECTIVE IMPAIRMENTS: Abnormal gait, decreased activity tolerance, decreased balance, decreased endurance, increased edema, improper body mechanics, and pain.   ACTIVITY LIMITATIONS: bending, standing, squatting, bathing, hygiene/grooming, locomotion level, and caring for others  PARTICIPATION LIMITATIONS: meal prep, cleaning, laundry, shopping, community activity, and yard work  PERSONAL FACTORS: Fitness, Past/current experiences, Time since onset of injury/illness/exacerbation, and 3+ comorbidities: varicose veins, HTN , venous insufficiency  are also affecting patient's functional outcome.   REHAB POTENTIAL:  Good  CLINICAL DECISION MAKING: Stable/uncomplicated  EVALUATION COMPLEXITY: Low  PLAN: PT FREQUENCY: 2x/week  PT DURATION: 6 weeks  PLANNED INTERVENTIONS: Therapeutic exercises, Therapeutic activity, Neuromuscular re-education, Balance training, Gait training, Patient/Family education, Joint manipulation, Joint mobilization, Stair training, Orthotic/Fit training, DME instructions, Aquatic Therapy, Dry Needling, Electrical stimulation, Spinal manipulation, Spinal mobilization, Cryotherapy, Moist heat, Compression bandaging, scar mobilization, Splintting, Taping, Traction, Ultrasound, Ionotophoresis 4mg /ml Dexamethasone, and Manual therapy   PLAN FOR NEXT SESSION: Continue to Instruct pt how to don and doff compression stocking when she brings this as well as donner.      Lurena Nida, PTA/CLT [facility] Ph: [phone]  04/06/2023, 3:56 PM

## 2023-04-14 ENCOUNTER — Ambulatory Visit (HOSPITAL_COMMUNITY): Payer: Medicare PPO | Admitting: Physical Therapy

## 2023-04-14 DIAGNOSIS — I872 Venous insufficiency (chronic) (peripheral): Secondary | ICD-10-CM | POA: Diagnosis not present

## 2023-04-14 DIAGNOSIS — S81802D Unspecified open wound, left lower leg, subsequent encounter: Secondary | ICD-10-CM

## 2023-04-14 DIAGNOSIS — R2689 Other abnormalities of gait and mobility: Secondary | ICD-10-CM | POA: Diagnosis not present

## 2023-04-14 NOTE — Therapy (Signed)
OUTPATIENT PHYSICAL THERAPY WOUND TREATMENT/progress note Patient Name: Jody Hernandez MRN: [id number] DOB:04/18/1951, 72 y.o., female Today's Date: 04/14/2023  Progress Note Reporting Period 03/07/23 to 04/14/23  See note below for Objective Data and Assessment of Progress/Goals.     PCP: Elfredia Nevins, MD REFERRING PROVIDER: Elfredia Nevins, MD  END OF SESSION:  PT End of Session - 04/14/23 1135     Visit Number 22    Number of Visits 30    Date for PT Re-Evaluation 04/14/23    Authorization Type [employer]    Authorization Time Period 12 visits approved from 10/4 thr 11/15    Authorization - Visit Number 11    Authorization - Number of Visits 12    Progress Note Due on Visit 23    PT Start Time 1105    PT Stop Time 1130    PT Time Calculation (min) 25 min    Activity Tolerance Patient tolerated treatment well               Past Medical History:  Diagnosis Date   Hypertension    Past Surgical History:  Procedure Laterality Date   BREAST SURGERY     lumpectomy   CYSTOSCOPY WITH STENT PLACEMENT Left 03/09/2017   Procedure: CYSTOSCOPY, RETROGRADE PYELOGRAM WITH STENT PLACEMENT;  Surgeon: Alfredo Martinez, MD;  Location: [facility];  Service: Urology;  Laterality: Left;   Patient Active Problem List   Diagnosis Date Noted   Sepsis (HCC) 03/09/2017   Ureteral stone with hydronephrosis    Hypertension 03/08/2017   Sepsis secondary to UTI (HCC) 03/08/2017   Urinary tract obstruction by kidney stone 03/08/2017    ONSET DATE: 1 month approx.  REFERRING DIAG: L03.119 (ICD-10-CM) - Cellulitis of unspecified part of limb I83.009 (ICD-10-CM) - Varicose veins of unspecified lower extremity with ulcer of unspecified site I87.2 (ICD-10-CM) - Venous insufficiency (chronic) (peripheral)  THERAPY DIAG:  Peripheral venous insufficiency  Multiple open wounds of lower leg, left, subsequent encounter  Rationale for Evaluation and Treatment: Rehabilitation   Wound  Therapy - 04/14/23 0001     Subjective Pt has no complaint.  Dressing is comfortable.    Patient and Family Stated Goals wound to heal    Date of Onset 12/19/22    Prior Treatments antibiotics    Pain Score 0-No pain    Evaluation and Treatment Procedures Explained to Patient/Family Yes    Evaluation and Treatment Procedures agreed to    Wound Properties Date First Assessed: 01/19/23 Time First Assessed: 1345 Wound Type: Venous stasis ulcer Location: Pretibial Location Orientation: Left Wound Description (Comments): anterior tibial wound Present on Admission: Yes   Wound Image Images linked: 1    Dressing Type Impregnated gauze (bismuth);Gauze (Comment);Compression wrap    Dressing Status Old drainage    Dressing Change Frequency PRN    Site / Wound Assessment Red    % Wound base Red or Granulating 100%   was 90% on 03/07/23   % Wound base Yellow/Fibrinous Exudate 0%    Peri-wound Assessment Erythema (non-blanchable)    Wound Length (cm) 1.2 cm   was 2 on 03/07/23   Wound Width (cm) 0.7 cm   various witdth witn the widest 1.4   Wound Surface Area (cm^2) 0.84 cm^2    Drainage Amount Scant   was minimal   Drainage Description Serous    Treatment Cleansed;Other (Comment)   manual improve decongestion   Wound Therapy - Clinical Statement see below    Wound Therapy -  Functional Problem List bathing    Factors Delaying/Impairing Wound Healing Vascular compromise    Hydrotherapy Plan Debridement;Dressing change;Electrical stimulation;Patient/family education;Pulsatile lavage with suction;Ultrasonic wound therapy @35  KHz (+/- 3 KHz)    Wound Therapy - Frequency --   decrease to one time a week for 2 more weeks   Wound Therapy - Current Recommendations PT    Wound Plan debride and dressing changes PRN    Dressing  xeroform , 4X4, kerlix, coban, #5 netting.  Lotion followed by vaseline.                PATIENT EDUCATION: Education details: Patient educated on exam findings, POC, scope of  PT, dressing changes if soiled. Person educated: Patient Education method: Explanation Education comprehension: verbalized understanding   HOME EXERCISE PROGRAM:  Ankle pumps  GOALS: Goals reviewed with patient? Yes  SHORT TERM GOALS: Target date: 02/09/2023    Wound to decrease in size by half to demonstrate wound healing. Baseline: Goal status: MET  2.  Wound to be free from slough.  Baseline:  Goal status: IN PROGRESS    LONG TERM GOALS: Target date: 03/02/2023    Wound to be healed to reduce risk of infection Baseline:  Goal status: IN PROGRESS   ASSESSMENT:  CLINICAL IMPRESSION:   Wound measured and photographed today.  Wound is overall approximating, . Cleansed and moisturized LE well and changed dressing to xeroform to encouraged moisture.  Continued with coban for compression   PT reported overall comfort.  Instructed to make 1X week appts for 2 more weeks and feel wound will be healed at this point.  PT will need continued skilled therapy for educating pt on self care as well as keeping compression on LE   OBJECTIVE IMPAIRMENTS: Abnormal gait, decreased activity tolerance, decreased balance, decreased endurance, increased edema, improper body mechanics, and pain.   ACTIVITY LIMITATIONS: bending, standing, squatting, bathing, hygiene/grooming, locomotion level, and caring for others  PARTICIPATION LIMITATIONS: meal prep, cleaning, laundry, shopping, community activity, and yard work  PERSONAL FACTORS: Fitness, Past/current experiences, Time since onset of injury/illness/exacerbation, and 3+ comorbidities: varicose veins, HTN , venous insufficiency  are also affecting patient's functional outcome.   REHAB POTENTIAL: Good  CLINICAL DECISION MAKING: Stable/uncomplicated  EVALUATION COMPLEXITY: Low  PLAN: PT FREQUENCY: 2x/week decrease to one time a week for two more weeks   PT DURATION: 6 weeks  PLANNED INTERVENTIONS: Therapeutic exercises, Therapeutic  activity, Neuromuscular re-education, Balance training, Gait training, Patient/Family education, Joint manipulation, Joint mobilization, Stair training, Orthotic/Fit training, DME instructions, Aquatic Therapy, Dry Needling, Electrical stimulation, Spinal manipulation, Spinal mobilization, Cryotherapy, Moist heat, Compression bandaging, scar mobilization, Splintting, Taping, Traction, Ultrasound, Ionotophoresis 4mg /ml Dexamethasone, and Manual therapy   PLAN FOR NEXT SESSION: continue to promote a healthy environment for wound healing.     Virgina Organ, PT CLT [phone]   04/14/2023, 11:42 AM  [employer] Auth Request  Referring diagnosis code (ICD 10)? L03.119, 183.009 Treatment diagnosis codes (ICD 10)? (if different than referring diagnosis) 187.2, s81.802D What was this (referring dx) caused by? []  Surgery []  Fall []  Ongoing issue []  Arthritis [x]  Other: __CVI__________  Laterality: []  Rt [x]  Lt []  Both  Deficits: [x]  Pain []  Stiffness []  Weakness [x]  Edema []  Balance Deficits []  Coordination []  Gait Disturbance []  ROM [x]  Other open wounds    Functional Tool Score: N/A   CPT codes: See Planned Interventions listed in the Plan section of the Evaluation.

## 2023-04-21 ENCOUNTER — Ambulatory Visit (HOSPITAL_COMMUNITY): Payer: Medicare PPO | Admitting: Physical Therapy

## 2023-04-21 DIAGNOSIS — R2689 Other abnormalities of gait and mobility: Secondary | ICD-10-CM

## 2023-04-21 DIAGNOSIS — I872 Venous insufficiency (chronic) (peripheral): Secondary | ICD-10-CM | POA: Diagnosis not present

## 2023-04-21 DIAGNOSIS — S81802D Unspecified open wound, left lower leg, subsequent encounter: Secondary | ICD-10-CM

## 2023-04-21 NOTE — Therapy (Signed)
OUTPATIENT PHYSICAL THERAPY WOUND TREATMENT    PCP: Elfredia Nevins, MD REFERRING PROVIDER: Elfredia Nevins, MD  END OF SESSION:  PT End of Session - 04/21/23 1605     Visit Number 23    Number of Visits 30    Date for PT Re-Evaluation 04/30/23    Authorization Type [employer] requested more visits    Authorization - Visit Number 12    Authorization - Number of Visits 12    Progress Note Due on Visit 23    PT Start Time 1017    PT Stop Time 1100    PT Time Calculation (min) 43 min    Activity Tolerance Patient tolerated treatment well    Behavior During Therapy WFL for tasks assessed/performed               Past Medical History:  Diagnosis Date   Hypertension    Past Surgical History:  Procedure Laterality Date   BREAST SURGERY     lumpectomy   CYSTOSCOPY WITH STENT PLACEMENT Left 03/09/2017   Procedure: CYSTOSCOPY, RETROGRADE PYELOGRAM WITH STENT PLACEMENT;  Surgeon: Alfredo Martinez, MD;  Location: [facility];  Service: Urology;  Laterality: Left;   Patient Active Problem List   Diagnosis Date Noted   Sepsis (HCC) 03/09/2017   Ureteral stone with hydronephrosis    Hypertension 03/08/2017   Sepsis secondary to UTI (HCC) 03/08/2017   Urinary tract obstruction by kidney stone 03/08/2017    ONSET DATE: 1 month approx.  REFERRING DIAG: L03.119 (ICD-10-CM) - Cellulitis of unspecified part of limb I83.009 (ICD-10-CM) - Varicose veins of unspecified lower extremity with ulcer of unspecified site I87.2 (ICD-10-CM) - Venous insufficiency (chronic) (peripheral)  THERAPY DIAG:  Peripheral venous insufficiency  Multiple open wounds of lower leg, left, subsequent encounter  Other abnormalities of gait and mobility  Rationale for Evaluation and Treatment: Rehabilitation   Wound Therapy - 04/21/23 0001     Subjective PT comes in with her donning device states that she was able to don the compression garment on her Rt LE without using the donner.    Patient and  Family Stated Goals wound to heal    Date of Onset 12/19/22    Prior Treatments antibiotics    Pain Score 0-No pain    Evaluation and Treatment Procedures Explained to Patient/Family Yes    Evaluation and Treatment Procedures agreed to    Wound Properties Date First Assessed: 01/19/23 Time First Assessed: 1345 Wound Type: Venous stasis ulcer Location: Pretibial Location Orientation: Left Wound Description (Comments): anterior tibial wound Present on Admission: Yes   Wound Image Images linked: 1    Dressing Type Impregnated gauze (bismuth);Gauze (Comment);Compression wrap    Dressing Changed Changed    Dressing Status Old drainage    Dressing Change Frequency PRN    Site / Wound Assessment Red    % Wound base Red or Granulating 100%   was 90%   % Wound base Yellow/Fibrinous Exudate 0%    Peri-wound Assessment Erythema (non-blanchable)    Wound Length (cm) 1.4 cm   was 2.7 cm   Wound Width (cm) 1.3 cm   was 2 cm   Wound Depth (cm) 0 cm   was .2 cm   Wound Volume (cm^3) 0 cm^3    Wound Surface Area (cm^2) 1.82 cm^2    Drainage Amount Scant    Drainage Description Serous    Treatment Cleansed;Other (Comment)   manual   Wound Therapy - Clinical Statement see below  Wound Therapy - Functional Problem List bathing    Factors Delaying/Impairing Wound Healing Vascular compromise    Hydrotherapy Plan Debridement;Dressing change;Electrical stimulation;Patient/family education;Pulsatile lavage with suction;Ultrasonic wound therapy @35  KHz (+/- 3 KHz)    Wound Therapy - Frequency --   continue at  one time a week for 4 more weeks   Wound Therapy - Current Recommendations PT    Wound Plan debride and dressing changes PRN    Dressing  xeroform followed by profore-lite compression bandaging. Marland Kitchen                PATIENT EDUCATION: Education details: Patient educated on exam findings, POC, scope of PT, dressing changes if soiled. Person educated: Patient Education method:  Explanation Education comprehension: verbalized understanding   HOME EXERCISE PROGRAM:  Ankle pumps  GOALS: Goals reviewed with patient? Yes  SHORT TERM GOALS: Target date: 02/09/2023    Wound to decrease in size by half to demonstrate wound healing. Baseline: Goal status: MET  2.  Wound to be free from slough.  Baseline:  Goal status: IN PROGRESS    LONG TERM GOALS: Target date: 03/02/2023    Wound to be healed to reduce risk of infection Baseline:  Goal status: IN PROGRESS   ASSESSMENT:  CLINICAL IMPRESSION:  Upon taking dressing off wound is actually slightly larger than last treatment.  PT continues to have an abrasion on the anterior aspect of her LE measuring 4x2 with no depth.  Therapist placed 2 ply xeroform to this area to prevent irritation.   Requested more visits last treatment but as yet have not had authorization.   OBJECTIVE IMPAIRMENTS: Abnormal gait, decreased activity tolerance, decreased balance, decreased endurance, increased edema, improper body mechanics, and pain.   ACTIVITY LIMITATIONS: bending, standing, squatting, bathing, hygiene/grooming, locomotion level, and caring for others  PARTICIPATION LIMITATIONS: meal prep, cleaning, laundry, shopping, community activity, and yard work  PERSONAL FACTORS: Fitness, Past/current experiences, Time since onset of injury/illness/exacerbation, and 3+ comorbidities: varicose veins, HTN , venous insufficiency  are also affecting patient's functional outcome.   REHAB POTENTIAL: Good  CLINICAL DECISION MAKING: Stable/uncomplicated  EVALUATION COMPLEXITY: Low  PLAN: PT FREQUENCY: 2x/week decrease to one time a week for two more weeks   PT DURATION: 6 weeks  PLANNED INTERVENTIONS: Therapeutic exercises, Therapeutic activity, Neuromuscular re-education, Balance training, Gait training, Patient/Family education, Joint manipulation, Joint mobilization, Stair training, Orthotic/Fit training, DME instructions,  Aquatic Therapy, Dry Needling, Electrical stimulation, Spinal manipulation, Spinal mobilization, Cryotherapy, Moist heat, Compression bandaging, scar mobilization, Splintting, Taping, Traction, Ultrasound, Ionotophoresis 4mg /ml Dexamethasone, and Manual therapy   PLAN FOR NEXT SESSION: continue to promote a healthy environment for wound healing.     Virgina Organ, PT CLT [phone]   04/21/2023, 4:16 PM

## 2023-04-25 ENCOUNTER — Ambulatory Visit (HOSPITAL_COMMUNITY): Payer: Medicare PPO

## 2023-04-25 ENCOUNTER — Encounter (HOSPITAL_COMMUNITY): Payer: Self-pay

## 2023-04-25 DIAGNOSIS — I872 Venous insufficiency (chronic) (peripheral): Secondary | ICD-10-CM

## 2023-04-25 DIAGNOSIS — S81802D Unspecified open wound, left lower leg, subsequent encounter: Secondary | ICD-10-CM

## 2023-04-25 DIAGNOSIS — R2689 Other abnormalities of gait and mobility: Secondary | ICD-10-CM

## 2023-04-25 NOTE — Therapy (Signed)
OUTPATIENT PHYSICAL THERAPY WOUND TREATMENT    PCP: Elfredia Nevins, MD REFERRING PROVIDER: Elfredia Nevins, MD  END OF SESSION: END OF SESSION:   PT End of Session - 04/25/23 0756     Visit Number 24    Number of Visits 30    Date for PT Re-Evaluation 04/30/23    Authorization Type [employer] requested more visits    Authorization Time Period cohere approved 8 visits 04/15/23-->05/27/2023    Authorization - Visit Number 1    Authorization - Number of Visits 8    Progress Note Due on Visit 31    PT Start Time 0725   late arrival   PT Stop Time 0753    PT Time Calculation (min) 28 min    Activity Tolerance Patient tolerated treatment well    Behavior During Therapy WFL for tasks assessed/performed              Past Medical History:  Diagnosis Date   Hypertension    Past Surgical History:  Procedure Laterality Date   BREAST SURGERY     lumpectomy   CYSTOSCOPY WITH STENT PLACEMENT Left 03/09/2017   Procedure: CYSTOSCOPY, RETROGRADE PYELOGRAM WITH STENT PLACEMENT;  Surgeon: Alfredo Martinez, MD;  Location: [facility];  Service: Urology;  Laterality: Left;   Patient Active Problem List   Diagnosis Date Noted   Sepsis (HCC) 03/09/2017   Ureteral stone with hydronephrosis    Hypertension 03/08/2017   Sepsis secondary to UTI (HCC) 03/08/2017   Urinary tract obstruction by kidney stone 03/08/2017    ONSET DATE: 1 month approx.  REFERRING DIAG: L03.119 (ICD-10-CM) - Cellulitis of unspecified part of limb I83.009 (ICD-10-CM) - Varicose veins of unspecified lower extremity with ulcer of unspecified site I87.2 (ICD-10-CM) - Venous insufficiency (chronic) (peripheral)  THERAPY DIAG:  Peripheral venous insufficiency  Multiple open wounds of lower leg, left, subsequent encounter  Other abnormalities of gait and mobility  Rationale for Evaluation and Treatment: Rehabilitation   Wound Therapy - 04/25/23 0001     Subjective Pt arrived with dressings intact, no  reports of pain today.    Patient and Family Stated Goals wound to heal    Date of Onset 12/19/22    Prior Treatments antibiotics    Pain Scale 0-10    Pain Score 0-No pain    Evaluation and Treatment Procedures Explained to Patient/Family Yes    Evaluation and Treatment Procedures agreed to    Wound Properties Date First Assessed: 01/19/23 Time First Assessed: 1345 Wound Type: Venous stasis ulcer Location: Pretibial Location Orientation: Left Wound Description (Comments): anterior tibial wound Present on Admission: Yes   Wound Image Images linked: 1    Dressing Type Impregnated gauze (bismuth);Gauze (Comment);Compression wrap    Dressing Changed Changed    Dressing Status Old drainage    Dressing Change Frequency PRN    Site / Wound Assessment Red    % Wound base Red or Granulating 100%    % Wound base Yellow/Fibrinous Exudate 0%    Peri-wound Assessment Erythema (non-blanchable)    Wound Length (cm) 2 cm    Wound Width (cm) 1.3 cm    Wound Surface Area (cm^2) 2.6 cm^2    Drainage Amount Scant    Drainage Description Serous    Treatment Cleansed;Other (Comment)   manual   Wound Therapy - Clinical Statement see below    Wound Therapy - Functional Problem List bathing    Factors Delaying/Impairing Wound Healing Vascular compromise    Hydrotherapy Plan Debridement;Dressing  change;Electrical stimulation;Patient/family education;Pulsatile lavage with suction;Ultrasonic wound therapy @35  KHz (+/- 3 KHz)    Wound Therapy - Frequency 2X / week    Wound Therapy - Current Recommendations PT    Wound Plan debride and dressing changes PRN    Dressing  xeroform followed by profore-lite compression bandaging. Marland Kitchen                PATIENT EDUCATION: Education details: Patient educated on exam findings, POC, scope of PT, dressing changes if soiled. Person educated: Patient Education method: Explanation Education comprehension: verbalized understanding   HOME EXERCISE PROGRAM:  Ankle  pumps  GOALS: Goals reviewed with patient? Yes  SHORT TERM GOALS: Target date: 02/09/2023    Wound to decrease in size by half to demonstrate wound healing. Baseline: Goal status: MET  2.  Wound to be free from slough.  Baseline:  Goal status: IN PROGRESS    LONG TERM GOALS: Target date: 03/02/2023    Wound to be healed to reduce risk of infection Baseline:  Goal status: IN PROGRESS   ASSESSMENT:  CLINICAL IMPRESSION:   Upon removal of dressings both initial wound and abrasion have increased in size.  Selective debridement required for removal of slough from wound bed and dry skin perimeter to promote healing.  Continues with xeroform and profore lite for edema control.  If wound does not decrease in size next session may need to change dressings.   OBJECTIVE IMPAIRMENTS: Abnormal gait, decreased activity tolerance, decreased balance, decreased endurance, increased edema, improper body mechanics, and pain.   ACTIVITY LIMITATIONS: bending, standing, squatting, bathing, hygiene/grooming, locomotion level, and caring for others  PARTICIPATION LIMITATIONS: meal prep, cleaning, laundry, shopping, community activity, and yard work  PERSONAL FACTORS: Fitness, Past/current experiences, Time since onset of injury/illness/exacerbation, and 3+ comorbidities: varicose veins, HTN , venous insufficiency  are also affecting patient's functional outcome.   REHAB POTENTIAL: Good  CLINICAL DECISION MAKING: Stable/uncomplicated  EVALUATION COMPLEXITY: Low  PLAN: PT FREQUENCY: 2x/week decrease to one time a week for two more weeks   PT DURATION: 6 weeks  PLANNED INTERVENTIONS: Therapeutic exercises, Therapeutic activity, Neuromuscular re-education, Balance training, Gait training, Patient/Family education, Joint manipulation, Joint mobilization, Stair training, Orthotic/Fit training, DME instructions, Aquatic Therapy, Dry Needling, Electrical stimulation, Spinal manipulation, Spinal  mobilization, Cryotherapy, Moist heat, Compression bandaging, scar mobilization, Splintting, Taping, Traction, Ultrasound, Ionotophoresis 4mg /ml Dexamethasone, and Manual therapy   PLAN FOR NEXT SESSION: continue to promote a healthy environment for wound healing.     Becky Sax, LPTA/CLT; CBIS [phone]  Juel Burrow, PTA 04/25/2023, 4:34 PM   04/25/2023, 4:34 PM

## 2023-05-03 ENCOUNTER — Encounter (HOSPITAL_COMMUNITY): Payer: Self-pay

## 2023-05-03 ENCOUNTER — Ambulatory Visit (HOSPITAL_COMMUNITY): Payer: Medicare PPO | Attending: Internal Medicine

## 2023-05-03 DIAGNOSIS — S81802D Unspecified open wound, left lower leg, subsequent encounter: Secondary | ICD-10-CM | POA: Diagnosis not present

## 2023-05-03 DIAGNOSIS — R2689 Other abnormalities of gait and mobility: Secondary | ICD-10-CM | POA: Diagnosis not present

## 2023-05-03 DIAGNOSIS — I872 Venous insufficiency (chronic) (peripheral): Secondary | ICD-10-CM | POA: Diagnosis not present

## 2023-05-03 NOTE — Therapy (Signed)
OUTPATIENT PHYSICAL THERAPY WOUND TREATMENT    PCP: Elfredia Nevins, MD REFERRING PROVIDER: Elfredia Nevins, MD  END OF SESSION: END OF SESSION:   PT End of Session - 05/03/23 0919     Visit Number 25    Number of Visits 30    Authorization Type [employer]    Authorization Time Period cohere approved 8 visits 04/15/23-->05/27/2023    Authorization - Visit Number 3   Authorization - Number of Visits 8    Progress Note Due on Visit 31    PT Start Time 0849    PT Stop Time 0915    PT Time Calculation (min) 26 min    Activity Tolerance Patient tolerated treatment well    Behavior During Therapy WFL for tasks assessed/performed              Past Medical History:  Diagnosis Date   Hypertension    Past Surgical History:  Procedure Laterality Date   BREAST SURGERY     lumpectomy   CYSTOSCOPY WITH STENT PLACEMENT Left 03/09/2017   Procedure: CYSTOSCOPY, RETROGRADE PYELOGRAM WITH STENT PLACEMENT;  Surgeon: Alfredo Martinez, MD;  Location: [facility];  Service: Urology;  Laterality: Left;   Patient Active Problem List   Diagnosis Date Noted   Sepsis (HCC) 03/09/2017   Ureteral stone with hydronephrosis    Hypertension 03/08/2017   Sepsis secondary to UTI (HCC) 03/08/2017   Urinary tract obstruction by kidney stone 03/08/2017    ONSET DATE: 1 month approx.  REFERRING DIAG: L03.119 (ICD-10-CM) - Cellulitis of unspecified part of limb I83.009 (ICD-10-CM) - Varicose veins of unspecified lower extremity with ulcer of unspecified site I87.2 (ICD-10-CM) - Venous insufficiency (chronic) (peripheral)  THERAPY DIAG:  Peripheral venous insufficiency  Multiple open wounds of lower leg, left, subsequent encounter  Other abnormalities of gait and mobility  Rationale for Evaluation and Treatment: Rehabilitation   Wound Therapy - 05/03/23 0001     Subjective Pt arrived with dressings intact, no reports of pain does c/o intermittent burning    Patient and Family Stated Goals  wound to heal    Date of Onset 12/19/22    Prior Treatments antibiotics    Pain Scale 0-10    Pain Score 0-No pain    Evaluation and Treatment Procedures Explained to Patient/Family Yes    Evaluation and Treatment Procedures agreed to    Wound Properties Date First Assessed: 01/19/23 Time First Assessed: 1345 Wound Type: Venous stasis ulcer Location: Pretibial Location Orientation: Left Wound Description (Comments): anterior tibial wound Present on Admission: Yes   Wound Image Images linked: 1    Dressing Type Impregnated gauze (bismuth);Gauze (Comment);Compression wrap    Dressing Changed Changed    Dressing Status Old drainage    Dressing Change Frequency PRN    Site / Wound Assessment Red    % Wound base Red or Granulating 100%    % Wound base Yellow/Fibrinous Exudate 0%    Peri-wound Assessment Erythema (non-blanchable)    Wound Length (cm) 2 cm    Wound Width (cm) 1.3 cm    Wound Surface Area (cm^2) 2.6 cm^2    Drainage Amount Scant    Drainage Description Serous    Treatment Cleansed;Debridement (Selective)    Selective Debridement (non-excisional) - Location anterior tibial region wound    Selective Debridement (non-excisional) - Tools Used Forceps    Selective Debridement (non-excisional) - Tissue Removed devitalized tissue    Wound Therapy - Clinical Statement see below    Wound Therapy -  Functional Problem List bathing    Factors Delaying/Impairing Wound Healing Vascular compromise    Hydrotherapy Plan Debridement;Dressing change;Electrical stimulation;Patient/family education;Pulsatile lavage with suction;Ultrasonic wound therapy @35  KHz (+/- 3 KHz)    Wound Therapy - Frequency 2X / week    Wound Therapy - Current Recommendations PT    Wound Plan debride and dressing changes PRN    Dressing  xeroform followed by profore-lite compression bandaging. Marland Kitchen                PATIENT EDUCATION: Education details: Patient educated on exam findings, POC, scope of PT, dressing  changes if soiled. Person educated: Patient Education method: Explanation Education comprehension: verbalized understanding   HOME EXERCISE PROGRAM:  Ankle pumps  GOALS: Goals reviewed with patient? Yes  SHORT TERM GOALS: Target date: 02/09/2023    Wound to decrease in size by half to demonstrate wound healing. Baseline: Goal status: MET  2.  Wound to be free from slough.  Baseline:  Goal status: IN PROGRESS    LONG TERM GOALS: Target date: 03/02/2023    Wound to be healed to reduce risk of infection Baseline:  Goal status: IN PROGRESS   ASSESSMENT:  CLINICAL IMPRESSION:   Wound presents with improved granulation following debridement, continues to have slough in one spot with increased depth.  Continued with xerofrom and edema control dressings with reports of comfort at EOS.     OBJECTIVE IMPAIRMENTS: Abnormal gait, decreased activity tolerance, decreased balance, decreased endurance, increased edema, improper body mechanics, and pain.   ACTIVITY LIMITATIONS: bending, standing, squatting, bathing, hygiene/grooming, locomotion level, and caring for others  PARTICIPATION LIMITATIONS: meal prep, cleaning, laundry, shopping, community activity, and yard work  PERSONAL FACTORS: Fitness, Past/current experiences, Time since onset of injury/illness/exacerbation, and 3+ comorbidities: varicose veins, HTN , venous insufficiency  are also affecting patient's functional outcome.   REHAB POTENTIAL: Good  CLINICAL DECISION MAKING: Stable/uncomplicated  EVALUATION COMPLEXITY: Low  PLAN: PT FREQUENCY: 2x/week decrease to one time a week for two more weeks   PT DURATION: 6 weeks  PLANNED INTERVENTIONS: Therapeutic exercises, Therapeutic activity, Neuromuscular re-education, Balance training, Gait training, Patient/Family education, Joint manipulation, Joint mobilization, Stair training, Orthotic/Fit training, DME instructions, Aquatic Therapy, Dry Needling, Electrical  stimulation, Spinal manipulation, Spinal mobilization, Cryotherapy, Moist heat, Compression bandaging, scar mobilization, Splintting, Taping, Traction, Ultrasound, Ionotophoresis 4mg /ml Dexamethasone, and Manual therapy   PLAN FOR NEXT SESSION: continue to promote a healthy environment for wound healing.  Change to medihoney as same measurement as last session.      Becky Sax, LPTA/CLT; CBIS [phone]  Virgina Organ, PT CLT [phone]  05/03/2023, 9:24 AM   05/03/2023, 9:24 AM

## 2023-05-04 ENCOUNTER — Encounter (INDEPENDENT_AMBULATORY_CARE_PROVIDER_SITE_OTHER): Payer: Self-pay | Admitting: *Deleted

## 2023-05-04 NOTE — Addendum Note (Signed)
Addended by: Bella Kennedy on: 05/04/2023 01:13 PM   Modules accepted: Orders

## 2023-05-10 ENCOUNTER — Ambulatory Visit (HOSPITAL_COMMUNITY): Payer: Medicare PPO | Admitting: Physical Therapy

## 2023-05-10 DIAGNOSIS — R2689 Other abnormalities of gait and mobility: Secondary | ICD-10-CM | POA: Diagnosis not present

## 2023-05-10 DIAGNOSIS — I872 Venous insufficiency (chronic) (peripheral): Secondary | ICD-10-CM

## 2023-05-10 DIAGNOSIS — S81802D Unspecified open wound, left lower leg, subsequent encounter: Secondary | ICD-10-CM

## 2023-05-10 NOTE — Therapy (Signed)
OUTPATIENT PHYSICAL THERAPY WOUND TREATMENT    PCP: Elfredia Nevins, MD REFERRING PROVIDER: Elfredia Nevins, MD  END OF SESSION:   PT End of Session - 05/10/23 1155     Visit Number 26    Number of Visits 30    Authorization Type [employer]    Authorization Time Period cohere approved 8 visits 04/15/23-->05/27/2023    Authorization - Number of Visits 8    Progress Note Due on Visit 31    PT Start Time 1105    PT Stop Time 1140    PT Time Calculation (min) 35 min    Activity Tolerance Patient tolerated treatment well    Behavior During Therapy WFL for tasks assessed/performed                  Past Medical History:  Diagnosis Date   Hypertension    Past Surgical History:  Procedure Laterality Date   BREAST SURGERY     lumpectomy   CYSTOSCOPY WITH STENT PLACEMENT Left 03/09/2017   Procedure: CYSTOSCOPY, RETROGRADE PYELOGRAM WITH STENT PLACEMENT;  Surgeon: Alfredo Martinez, MD;  Location: [facility];  Service: Urology;  Laterality: Left;   Patient Active Problem List   Diagnosis Date Noted   Sepsis (HCC) 03/09/2017   Ureteral stone with hydronephrosis    Hypertension 03/08/2017   Sepsis secondary to UTI (HCC) 03/08/2017   Urinary tract obstruction by kidney stone 03/08/2017    ONSET DATE: 1 month approx.  REFERRING DIAG: L03.119 (ICD-10-CM) - Cellulitis of unspecified part of limb I83.009 (ICD-10-CM) - Varicose veins of unspecified lower extremity with ulcer of unspecified site I87.2 (ICD-10-CM) - Venous insufficiency (chronic) (peripheral)  THERAPY DIAG:  Peripheral venous insufficiency  Other abnormalities of gait and mobility  Multiple open wounds of lower leg, left, subsequent encounter  Rationale for Evaluation and Treatment: Rehabilitation   Wound Therapy - 05/10/23 1200     Subjective no issues, doing well    Patient and Family Stated Goals wound to heal    Date of Onset 12/19/22    Prior Treatments antibiotics    Pain Scale 0-10    Pain  Score 0-No pain    Evaluation and Treatment Procedures Explained to Patient/Family Yes    Evaluation and Treatment Procedures agreed to    Wound Properties Date First Assessed: 01/19/23 Time First Assessed: 1345 Wound Type: Venous stasis ulcer Location: Pretibial Location Orientation: Left Wound Description (Comments): anterior tibial wound Present on Admission: Yes   Dressing Type Gauze (Comment);Compression wrap    Dressing Changed Changed    Dressing Status Old drainage    Dressing Change Frequency PRN    Site / Wound Assessment Red    % Wound base Red or Granulating 100%    Peri-wound Assessment Erythema (non-blanchable)    Drainage Amount Scant    Drainage Description Serous    Treatment Cleansed;Debridement (Selective)    Selective Debridement (non-excisional) - Location anterior tibial region wound    Selective Debridement (non-excisional) - Tools Used Forceps    Selective Debridement (non-excisional) - Tissue Removed devitalized tissue    Wound Therapy - Clinical Statement see below    Wound Therapy - Functional Problem List bathing    Factors Delaying/Impairing Wound Healing Vascular compromise    Hydrotherapy Plan Debridement;Dressing change;Electrical stimulation;Patient/family education;Pulsatile lavage with suction;Ultrasonic wound therapy @35  KHz (+/- 3 KHz)    Wound Therapy - Frequency 2X / week    Wound Therapy - Current Recommendations PT    Wound Plan debride and dressing  changes PRN    Dressing  medihoney followed by profore-lite compression bandaging. Marland Kitchen                 PATIENT EDUCATION: Education details: Patient educated on exam findings, POC, scope of PT, dressing changes if soiled. Person educated: Patient Education method: Explanation Education comprehension: verbalized understanding   HOME EXERCISE PROGRAM:  Ankle pumps  GOALS: Goals reviewed with patient? Yes  SHORT TERM GOALS: Target date: 02/09/2023    Wound to decrease in size by half  to demonstrate wound healing. Baseline: Goal status: MET  2.  Wound to be free from slough.  Baseline:  Goal status: IN PROGRESS    LONG TERM GOALS: Target date: 03/02/2023    Wound to be healed to reduce risk of infection Baseline:  Goal status: IN PROGRESS   ASSESSMENT:  CLINICAL IMPRESSION:   Wound with noted maceration as well as dry tissue entire perimeter.  Appears to have halted on approximating borders.  Debrided all dry tissue perimeter to promote, cleansed and applied vaseline to prevent maceration.  Changed dressing to medihoney per last plan and also due to delay of healing.  Continued with profore lite for compression.    OBJECTIVE IMPAIRMENTS: Abnormal gait, decreased activity tolerance, decreased balance, decreased endurance, increased edema, improper body mechanics, and pain.   ACTIVITY LIMITATIONS: bending, standing, squatting, bathing, hygiene/grooming, locomotion level, and caring for others  PARTICIPATION LIMITATIONS: meal prep, cleaning, laundry, shopping, community activity, and yard work  PERSONAL FACTORS: Fitness, Past/current experiences, Time since onset of injury/illness/exacerbation, and 3+ comorbidities: varicose veins, HTN , venous insufficiency  are also affecting patient's functional outcome.   REHAB POTENTIAL: Good  CLINICAL DECISION MAKING: Stable/uncomplicated  EVALUATION COMPLEXITY: Low  PLAN: PT FREQUENCY: 2x/week decrease to one time a week for two more weeks   PT DURATION: 6 weeks  PLANNED INTERVENTIONS: Therapeutic exercises, Therapeutic activity, Neuromuscular re-education, Balance training, Gait training, Patient/Family education, Joint manipulation, Joint mobilization, Stair training, Orthotic/Fit training, DME instructions, Aquatic Therapy, Dry Needling, Electrical stimulation, Spinal manipulation, Spinal mobilization, Cryotherapy, Moist heat, Compression bandaging, scar mobilization, Splintting, Taping, Traction, Ultrasound,  Ionotophoresis 4mg /ml Dexamethasone, and Manual therapy   PLAN FOR NEXT SESSION: continue to promote a healthy environment for wound healing.       Lurena Nida, PTA/CLT [facility] Ph: [phone]   05/10/2023, 12:05 PM   05/10/2023, 12:05 PM

## 2023-05-17 ENCOUNTER — Ambulatory Visit (HOSPITAL_COMMUNITY): Payer: Medicare PPO | Admitting: Physical Therapy

## 2023-05-17 DIAGNOSIS — R2689 Other abnormalities of gait and mobility: Secondary | ICD-10-CM

## 2023-05-17 DIAGNOSIS — I872 Venous insufficiency (chronic) (peripheral): Secondary | ICD-10-CM

## 2023-05-17 DIAGNOSIS — S81802D Unspecified open wound, left lower leg, subsequent encounter: Secondary | ICD-10-CM | POA: Diagnosis not present

## 2023-05-17 NOTE — Therapy (Signed)
OUTPATIENT PHYSICAL THERAPY WOUND TREATMENT    PCP: Elfredia Nevins, MD REFERRING PROVIDER: Elfredia Nevins, MD  END OF SESSION:   PT End of Session - 05/17/23 1143     Visit Number 27    Number of Visits 30    Authorization Type [employer]    Authorization Time Period cohere approved 8 visits 04/15/23-->05/27/2023    Authorization - Visit Number 5    Authorization - Number of Visits 8    Progress Note Due on Visit 31    PT Start Time 1015    PT Stop Time 1055    PT Time Calculation (min) 40 min    Activity Tolerance Patient tolerated treatment well    Behavior During Therapy WFL for tasks assessed/performed                  Past Medical History:  Diagnosis Date   Hypertension    Past Surgical History:  Procedure Laterality Date   BREAST SURGERY     lumpectomy   CYSTOSCOPY WITH STENT PLACEMENT Left 03/09/2017   Procedure: CYSTOSCOPY, RETROGRADE PYELOGRAM WITH STENT PLACEMENT;  Surgeon: Alfredo Martinez, MD;  Location: [facility];  Service: Urology;  Laterality: Left;   Patient Active Problem List   Diagnosis Date Noted   Sepsis (HCC) 03/09/2017   Ureteral stone with hydronephrosis    Hypertension 03/08/2017   Sepsis secondary to UTI (HCC) 03/08/2017   Urinary tract obstruction by kidney stone 03/08/2017    ONSET DATE: 1 month approx.  REFERRING DIAG: L03.119 (ICD-10-CM) - Cellulitis of unspecified part of limb I83.009 (ICD-10-CM) - Varicose veins of unspecified lower extremity with ulcer of unspecified site I87.2 (ICD-10-CM) - Venous insufficiency (chronic) (peripheral)  THERAPY DIAG:  Peripheral venous insufficiency  Other abnormalities of gait and mobility  Multiple open wounds of lower leg, left, subsequent encounter  Rationale for Evaluation and Treatment: Rehabilitation   Wound Therapy - 05/17/23 1144     Subjective pt states no pain or problems    Patient and Family Stated Goals wound to heal    Date of Onset 12/19/22    Prior Treatments  antibiotics    Pain Scale 0-10    Pain Score 0-No pain    Evaluation and Treatment Procedures Explained to Patient/Family Yes    Evaluation and Treatment Procedures agreed to    Wound Properties Date First Assessed: 01/19/23 Time First Assessed: 1345 Wound Type: Venous stasis ulcer Location: Pretibial Location Orientation: Left Wound Description (Comments): anterior tibial wound Present on Admission: Yes   Wound Image Images linked: 2    Dressing Type Gauze (Comment);Compression wrap    Dressing Changed Changed    Dressing Status Old drainage    Dressing Change Frequency PRN    Site / Wound Assessment Red    % Wound base Red or Granulating 100%   after debridement   Peri-wound Assessment Erythema (non-blanchable)    Wound Length (cm) 1 cm    Wound Width (cm) 1 cm    Wound Surface Area (cm^2) 1 cm^2    Drainage Amount Scant    Drainage Description Serous    Treatment Cleansed;Debridement (Selective)    Selective Debridement (non-excisional) - Location anterior tibial region wound, dry tissue, slough from woundbed    Selective Debridement (non-excisional) - Tools Used Forceps    Selective Debridement (non-excisional) - Tissue Removed devitalized tissue    Wound Therapy - Clinical Statement see below    Wound Therapy - Functional Problem List bathing    Factors  Delaying/Impairing Wound Healing Vascular compromise    Hydrotherapy Plan Debridement;Dressing change;Electrical stimulation;Patient/family education;Pulsatile lavage with suction;Ultrasonic wound therapy @35  KHz (+/- 3 KHz)    Wound Therapy - Frequency 2X / week    Wound Therapy - Current Recommendations PT    Wound Plan debride and dressing changes PRN    Dressing  xeroform followed by profore-lite compression bandaging. Marland Kitchen                 PATIENT EDUCATION: Education details: Patient educated on exam findings, POC, scope of PT, dressing changes if soiled. Person educated: Patient Education method:  Explanation Education comprehension: verbalized understanding   HOME EXERCISE PROGRAM:  Ankle pumps  GOALS: Goals reviewed with patient? Yes  SHORT TERM GOALS: Target date: 02/09/2023    Wound to decrease in size by half to demonstrate wound healing. Baseline: Goal status: MET  2.  Wound to be free from slough.  Baseline:  Goal status: IN PROGRESS    LONG TERM GOALS: Target date: 03/02/2023    Wound to be healed to reduce risk of infection Baseline:  Goal status: IN PROGRESS   ASSESSMENT:  CLINICAL IMPRESSION:   Wound covered in dry tissue, large area above arppxo 5.5X4 cm raw with dry tissue covering as well.  Actual wound location reduced in size, however one region approx 0.2 cm depth and 0.3X0.2 cm in size.  Able to debride all dry tissue from both locations. Cleansed and applied vaseline to perimeter and xeroform to help increase moisture. Photographs of wounds today stored in media. Continued with profore lite for compression.    OBJECTIVE IMPAIRMENTS: Abnormal gait, decreased activity tolerance, decreased balance, decreased endurance, increased edema, improper body mechanics, and pain.   ACTIVITY LIMITATIONS: bending, standing, squatting, bathing, hygiene/grooming, locomotion level, and caring for others  PARTICIPATION LIMITATIONS: meal prep, cleaning, laundry, shopping, community activity, and yard work  PERSONAL FACTORS: Fitness, Past/current experiences, Time since onset of injury/illness/exacerbation, and 3+ comorbidities: varicose veins, HTN , venous insufficiency  are also affecting patient's functional outcome.   REHAB POTENTIAL: Good  CLINICAL DECISION MAKING: Stable/uncomplicated  EVALUATION COMPLEXITY: Low  PLAN: PT FREQUENCY: 2x/week decrease to one time a week for two more weeks   PT DURATION: 6 weeks  PLANNED INTERVENTIONS: Therapeutic exercises, Therapeutic activity, Neuromuscular re-education, Balance training, Gait training,  Patient/Family education, Joint manipulation, Joint mobilization, Stair training, Orthotic/Fit training, DME instructions, Aquatic Therapy, Dry Needling, Electrical stimulation, Spinal manipulation, Spinal mobilization, Cryotherapy, Moist heat, Compression bandaging, scar mobilization, Splintting, Taping, Traction, Ultrasound, Ionotophoresis 4mg /ml Dexamethasone, and Manual therapy   PLAN FOR NEXT SESSION: continue to promote a healthy environment for wound healing.       Lurena Nida, PTA/CLT [facility] Ph: [phone]   05/17/2023, 11:51 AM

## 2023-05-23 ENCOUNTER — Ambulatory Visit (HOSPITAL_COMMUNITY): Payer: Medicare PPO | Admitting: Physical Therapy

## 2023-05-23 DIAGNOSIS — S81802D Unspecified open wound, left lower leg, subsequent encounter: Secondary | ICD-10-CM

## 2023-05-23 DIAGNOSIS — R2689 Other abnormalities of gait and mobility: Secondary | ICD-10-CM | POA: Diagnosis not present

## 2023-05-23 DIAGNOSIS — I872 Venous insufficiency (chronic) (peripheral): Secondary | ICD-10-CM

## 2023-05-23 NOTE — Therapy (Signed)
OUTPATIENT PHYSICAL THERAPY WOUND TREATMENT    PCP: Elfredia Nevins, MD REFERRING PROVIDER: Elfredia Nevins, MD  END OF SESSION:   PT End of Session - 05/23/23 1020     Visit Number 28    Number of Visits 30    Authorization Type [employer]    Authorization Time Period cohere approved 8 visits 04/15/23-->05/27/2023    Authorization - Number of Visits 8    Progress Note Due on Visit 31    PT Start Time 0930    PT Stop Time 1005    PT Time Calculation (min) 35 min    Activity Tolerance Patient tolerated treatment well    Behavior During Therapy WFL for tasks assessed/performed                  Past Medical History:  Diagnosis Date   Hypertension    Past Surgical History:  Procedure Laterality Date   BREAST SURGERY     lumpectomy   CYSTOSCOPY WITH STENT PLACEMENT Left 03/09/2017   Procedure: CYSTOSCOPY, RETROGRADE PYELOGRAM WITH STENT PLACEMENT;  Surgeon: Alfredo Martinez, MD;  Location: [facility];  Service: Urology;  Laterality: Left;   Patient Active Problem List   Diagnosis Date Noted   Sepsis (HCC) 03/09/2017   Ureteral stone with hydronephrosis    Hypertension 03/08/2017   Sepsis secondary to UTI (HCC) 03/08/2017   Urinary tract obstruction by kidney stone 03/08/2017    ONSET DATE: 1 month approx.  REFERRING DIAG: L03.119 (ICD-10-CM) - Cellulitis of unspecified part of limb I83.009 (ICD-10-CM) - Varicose veins of unspecified lower extremity with ulcer of unspecified site I87.2 (ICD-10-CM) - Venous insufficiency (chronic) (peripheral)  THERAPY DIAG:  Peripheral venous insufficiency  Other abnormalities of gait and mobility  Multiple open wounds of lower leg, left, subsequent encounter  Rationale for Evaluation and Treatment: Rehabilitation   Wound Therapy - 05/23/23 1020     Subjective pt states no pain or problems    Patient and Family Stated Goals wound to heal    Date of Onset 12/19/22    Prior Treatments antibiotics    Pain Scale 0-10     Pain Score 0-No pain    Evaluation and Treatment Procedures Explained to Patient/Family Yes    Evaluation and Treatment Procedures agreed to    Wound Properties Date First Assessed: 01/19/23 Time First Assessed: 1345 Wound Type: Venous stasis ulcer Location: Pretibial Location Orientation: Left Wound Description (Comments): anterior tibial wound Present on Admission: Yes   Dressing Type Gauze (Comment);Compression wrap    Dressing Changed Changed    Dressing Status Old drainage    Dressing Change Frequency PRN    Site / Wound Assessment Red    % Wound base Red or Granulating 100%    Peri-wound Assessment Erythema (non-blanchable)    Drainage Amount Minimal    Drainage Description Serosanguineous    Treatment Cleansed;Debridement (Selective)    Selective Debridement (non-excisional) - Location anterior tibial region wound, dry tissue, slough from woundbed    Selective Debridement (non-excisional) - Tools Used Forceps    Selective Debridement (non-excisional) - Tissue Removed devitalized tissue    Wound Therapy - Clinical Statement see below    Wound Therapy - Functional Problem List bathing    Factors Delaying/Impairing Wound Healing Vascular compromise    Hydrotherapy Plan Debridement;Dressing change;Electrical stimulation;Patient/family education;Pulsatile lavage with suction;Ultrasonic wound therapy @35  KHz (+/- 3 KHz)    Wound Therapy - Frequency 2X / week    Wound Therapy - Current Recommendations PT  Wound Plan debride and dressing changes PRN    Dressing  xeroform followed by profore-lite compression bandaging. Marland Kitchen                 PATIENT EDUCATION: Education details: Patient educated on exam findings, POC, scope of PT, dressing changes if soiled. Person educated: Patient Education method: Explanation Education comprehension: verbalized understanding   HOME EXERCISE PROGRAM:  Ankle pumps  GOALS: Goals reviewed with patient? Yes  SHORT TERM GOALS: Target date:  02/09/2023    Wound to decrease in size by half to demonstrate wound healing. Baseline: Goal status: MET  2.  Wound to be free from slough.  Baseline:  Goal status: IN PROGRESS    LONG TERM GOALS: Target date: 03/02/2023    Wound to be healed to reduce risk of infection Baseline:  Goal status: IN PROGRESS   ASSESSMENT:  CLINICAL IMPRESSION:   Large raw area that was covered with dry tissue last visit is now draining minimally with some redness and dry tissue covering as well.  Smaller area with little change as compared to last week. Continued with debridement for all dry tissue from both locations as well as slough inside wound bed.   Cleansed and applied vaseline to perimeter and xeroform to help increase moisture. Continued with profore lite for compression.    OBJECTIVE IMPAIRMENTS: Abnormal gait, decreased activity tolerance, decreased balance, decreased endurance, increased edema, improper body mechanics, and pain.   ACTIVITY LIMITATIONS: bending, standing, squatting, bathing, hygiene/grooming, locomotion level, and caring for others  PARTICIPATION LIMITATIONS: meal prep, cleaning, laundry, shopping, community activity, and yard work  PERSONAL FACTORS: Fitness, Past/current experiences, Time since onset of injury/illness/exacerbation, and 3+ comorbidities: varicose veins, HTN , venous insufficiency  are also affecting patient's functional outcome.   REHAB POTENTIAL: Good  CLINICAL DECISION MAKING: Stable/uncomplicated  EVALUATION COMPLEXITY: Low  PLAN: PT FREQUENCY: 2x/week decrease to one time a week for two more weeks   PT DURATION: 6 weeks  PLANNED INTERVENTIONS: Therapeutic exercises, Therapeutic activity, Neuromuscular re-education, Balance training, Gait training, Patient/Family education, Joint manipulation, Joint mobilization, Stair training, Orthotic/Fit training, DME instructions, Aquatic Therapy, Dry Needling, Electrical stimulation, Spinal manipulation,  Spinal mobilization, Cryotherapy, Moist heat, Compression bandaging, scar mobilization, Splintting, Taping, Traction, Ultrasound, Ionotophoresis 4mg /ml Dexamethasone, and Manual therapy   PLAN FOR NEXT SESSION: continue to promote a healthy environment for wound healing.       Lurena Nida, PTA/CLT [facility] Ph: [phone]   05/23/2023, 10:35 AM

## 2023-06-02 ENCOUNTER — Ambulatory Visit (HOSPITAL_COMMUNITY): Payer: Medicare PPO | Attending: Internal Medicine | Admitting: Physical Therapy

## 2023-06-02 DIAGNOSIS — R2689 Other abnormalities of gait and mobility: Secondary | ICD-10-CM | POA: Insufficient documentation

## 2023-06-02 DIAGNOSIS — I872 Venous insufficiency (chronic) (peripheral): Secondary | ICD-10-CM | POA: Diagnosis not present

## 2023-06-02 DIAGNOSIS — S81802D Unspecified open wound, left lower leg, subsequent encounter: Secondary | ICD-10-CM | POA: Insufficient documentation

## 2023-06-02 NOTE — Therapy (Addendum)
 OUTPATIENT PHYSICAL THERAPY WOUND TREATMENT    PCP: Bertell Satterfield, MD REFERRING PROVIDER: Bertell Satterfield, MD  END OF SESSION:   PT End of Session - 06/02/23 1235     Visit Number 29    Number of Visits 30    Date for PT Re-Evaluation 06/14/23    Authorization Type [employer]    Authorization Time Period cohere approved 8 visits 04/15/23-->05/27/2023    Authorization - Visit Number 6    Authorization - Number of Visits 8    Progress Note Due on Visit 31    PT Start Time 1145    PT Stop Time 1220    PT Time Calculation (min) 35 min    Activity Tolerance Patient tolerated treatment well    Behavior During Therapy WFL for tasks assessed/performed                  Past Medical History:  Diagnosis Date   Hypertension    Past Surgical History:  Procedure Laterality Date   BREAST SURGERY     lumpectomy   CYSTOSCOPY WITH STENT PLACEMENT Left 03/09/2017   Procedure: CYSTOSCOPY, RETROGRADE PYELOGRAM WITH STENT PLACEMENT;  Surgeon: Gaston Hamilton, MD;  Location: [facility];  Service: Urology;  Laterality: Left;   Patient Active Problem List   Diagnosis Date Noted   Sepsis (HCC) 03/09/2017   Ureteral stone with hydronephrosis    Hypertension 03/08/2017   Sepsis secondary to UTI (HCC) 03/08/2017   Urinary tract obstruction by kidney stone 03/08/2017    ONSET DATE: 1 month approx.  REFERRING DIAG: L03.119 (ICD-10-CM) - Cellulitis of unspecified part of limb I83.009 (ICD-10-CM) - Varicose veins of unspecified lower extremity with ulcer of unspecified site I87.2 (ICD-10-CM) - Venous insufficiency (chronic) (peripheral)  THERAPY DIAG:  No diagnosis found.  Rationale for Evaluation and Treatment: Rehabilitation   Wound Therapy - 06/02/23 0001     Subjective pt states no pain or problems    Patient and Family Stated Goals wound to heal    Date of Onset 12/19/22    Prior Treatments antibiotics    Pain Scale 0-10    Pain Score 0-No pain    Evaluation and  Treatment Procedures Explained to Patient/Family Yes    Evaluation and Treatment Procedures agreed to    Wound Properties Date First Assessed: 01/19/23 Time First Assessed: 1345 Wound Type: Venous stasis ulcer Location: Pretibial Location Orientation: Left Wound Description (Comments): anterior tibial wound Present on Admission: Yes   Dressing Type Gauze (Comment);Compression wrap    Dressing Changed Changed    Dressing Status Old drainage    Dressing Change Frequency PRN    Site / Wound Assessment Red    % Wound base Red or Granulating 100%    Peri-wound Assessment Erythema (non-blanchable)    Wound Length (cm) --   area has turned into three small wounds which almost healed.   Drainage Amount Scant    Drainage Description Serous    Treatment Cleansed;Debridement (Selective)    Selective Debridement (non-excisional) - Location anterior tibial region wound, dry tissue, slough from woundbed    Selective Debridement (non-excisional) - Tools Used Forceps    Selective Debridement (non-excisional) - Tissue Removed devitalized tissue    Wound Therapy - Clinical Statement see below    Wound Therapy - Functional Problem List bathing    Factors Delaying/Impairing Wound Healing Vascular compromise    Hydrotherapy Plan Debridement;Dressing change;Electrical stimulation;Patient/family education;Pulsatile lavage with suction;Ultrasonic wound therapy @35  KHz (+/- 3 KHz)  Wound Therapy - Frequency 2X / week    Wound Therapy - Current Recommendations PT    Wound Plan debride and dressing changes PRN    Dressing  xeroform followed by profore-compression bandaging. .            Planned intervention:  97140 Manual, 02412 wound care; and 97535 self care     PATIENT EDUCATION: Education details: Patient educated on exam findings, POC, scope of PT, dressing changes if soiled. Person educated: Patient Education method: Explanation Education comprehension: verbalized understanding   HOME EXERCISE  PROGRAM:  Ankle pumps  GOALS: Goals reviewed with patient? Yes  SHORT TERM GOALS: Target date: 02/09/2023    Wound to decrease in size by half to demonstrate wound healing. Baseline: Goal status: MET  2.  Wound to be free from slough.  Baseline:  Goal status: MET    LONG TERM GOALS: Target date: 03/02/2023    Wound to be healed to reduce risk of infection Baseline:  Goal status: IN PROGRESS   ASSESSMENT:  CLINICAL IMPRESSION:  Pt approved for 8 visits ending 12/31 only used 6 will request visits to ensure total healing.  Large raw area that was covered with dry tissue last visit is now no longer draining  draining .  Smaller area with little change as compared to last week. PT has area on lateral area which was one wound now three small areas which are almost healed.  Pt has had an extreme difficulty getting these wounds to heal therefore will see one more time to ensure wounds totally heal.  Pt was told to bring her compression garment in next visit .  Continued with debridement for all dry tissue from both locations as well as slough inside wound bed.   Cleansed and applied vaseline to perimeter and xeroform to help increase moisture. Continued with profore lite for compression.    OBJECTIVE IMPAIRMENTS: Abnormal gait, decreased activity tolerance, decreased balance, decreased endurance, increased edema, improper body mechanics, and pain.   ACTIVITY LIMITATIONS: bending, standing, squatting, bathing, hygiene/grooming, locomotion level, and caring for others  PARTICIPATION LIMITATIONS: meal prep, cleaning, laundry, shopping, community activity, and yard work  PERSONAL FACTORS: Fitness, Past/current experiences, Time since onset of injury/illness/exacerbation, and 3+ comorbidities: varicose veins, HTN , venous insufficiency  are also affecting patient's functional outcome.   REHAB POTENTIAL: Good  CLINICAL DECISION MAKING: Stable/uncomplicated  EVALUATION  COMPLEXITY: Low  PLAN: PT FREQUENCY: 1x/week decrease to one time a week for two more weeks including todays treatment.   PT DURATION: 6 weeks total of 15 weeks   PLANNED INTERVENTIONS: Therapeutic exercises, Therapeutic activity, Neuromuscular re-education, Balance training, Gait training, Patient/Family education, Joint manipulation, Joint mobilization, Stair training, Orthotic/Fit training, DME instructions, Aquatic Therapy, Dry Needling, Electrical stimulation, Spinal manipulation, Spinal mobilization, Cryotherapy, Moist heat, Compression bandaging, scar mobilization, Splintting, Taping, Traction, Ultrasound, Ionotophoresis 4mg /ml Dexamethasone , and Manual therapy   PLAN FOR NEXT SESSION: continue to promote a healthy environment for wound healing.     Montie Metro, PT CLT [phone]    06/02/2023, 12:37 PM  [employer] Auth Request  Referring diagnosis code (ICD 10)? L03.119, 183.009, 187.2 Treatment diagnosis codes (ICD 10)? (if different than referring diagnosis) above as well as S81.802D What was this (referring dx) caused by? []  Surgery []  Fall []  Ongoing issue []  Arthritis [x]  Other: _____CVI_______  Laterality: []  Rt [x]  Lt []  Both  Deficits: []  Pain []  Stiffness []  Weakness []  Edema []  Balance Deficits []  Coordination []  Gait Disturbance []  ROM [x]   Other non healing wound    Functional Tool Score: n/a   CPT codes: See Planned Interventions listed in the Plan section of the Evaluation.

## 2023-06-06 ENCOUNTER — Ambulatory Visit (HOSPITAL_COMMUNITY): Payer: Medicare PPO | Admitting: Physical Therapy

## 2023-06-06 DIAGNOSIS — I872 Venous insufficiency (chronic) (peripheral): Secondary | ICD-10-CM

## 2023-06-06 DIAGNOSIS — R2689 Other abnormalities of gait and mobility: Secondary | ICD-10-CM | POA: Diagnosis not present

## 2023-06-06 DIAGNOSIS — S81802D Unspecified open wound, left lower leg, subsequent encounter: Secondary | ICD-10-CM | POA: Diagnosis not present

## 2023-06-06 NOTE — Therapy (Signed)
 OUTPATIENT PHYSICAL THERAPY WOUND TREATMENT    PCP: Bertell Satterfield, MD REFERRING PROVIDER: Bertell Satterfield, MD  END OF SESSION:   PT End of Session - 06/06/23 1301     Visit Number 30    Number of Visits 33    Date for PT Re-Evaluation 06/22/23    Authorization Type [employer]    Authorization Time Period cohere approved 5 visits 06/02/23-->06/23/2023    Authorization - Visit Number 2    Authorization - Number of Visits 5    Progress Note Due on Visit 33    PT Start Time 1145    PT Stop Time 1215    PT Time Calculation (min) 30 min    Activity Tolerance Patient tolerated treatment well    Behavior During Therapy WFL for tasks assessed/performed                  Past Medical History:  Diagnosis Date   Hypertension    Past Surgical History:  Procedure Laterality Date   BREAST SURGERY     lumpectomy   CYSTOSCOPY WITH STENT PLACEMENT Left 03/09/2017   Procedure: CYSTOSCOPY, RETROGRADE PYELOGRAM WITH STENT PLACEMENT;  Surgeon: Gaston Hamilton, MD;  Location: [facility];  Service: Urology;  Laterality: Left;   Patient Active Problem List   Diagnosis Date Noted   Sepsis (HCC) 03/09/2017   Ureteral stone with hydronephrosis    Hypertension 03/08/2017   Sepsis secondary to UTI (HCC) 03/08/2017   Urinary tract obstruction by kidney stone 03/08/2017    ONSET DATE: 1 month approx.  REFERRING DIAG: L03.119 (ICD-10-CM) - Cellulitis of unspecified part of limb I83.009 (ICD-10-CM) - Varicose veins of unspecified lower extremity with ulcer of unspecified site I87.2 (ICD-10-CM) - Venous insufficiency (chronic) (peripheral)  THERAPY DIAG:  Peripheral venous insufficiency  Multiple open wounds of lower leg, left, subsequent encounter  Other abnormalities of gait and mobility  Rationale for Evaluation and Treatment: Rehabilitation   Wound Therapy - 06/06/23 0001     Subjective pt states no pain or problems    Patient and Family Stated Goals wound to heal    Date of  Onset 12/19/22    Prior Treatments antibiotics    Pain Scale 0-10    Pain Score 0-No pain    Evaluation and Treatment Procedures Explained to Patient/Family Yes    Evaluation and Treatment Procedures agreed to    Wound Properties Date First Assessed: 01/19/23 Time First Assessed: 1345 Wound Type: Venous stasis ulcer Location: Pretibial Location Orientation: Left Wound Description (Comments): anterior tibial wound Present on Admission: Yes   Dressing Type Gauze (Comment);Compression wrap    Dressing Changed Changed    Dressing Status Old drainage    Dressing Change Frequency PRN    Site / Wound Assessment Red    % Wound base Red or Granulating 100%    Peri-wound Assessment Erythema (non-blanchable)    Wound Length (cm) --   anterior and lateral wounds are now healed.  Upon removing the compression dressing there are at least five very small pin prick wounds located distal anterior LE>   Drainage Amount Scant    Drainage Description Serous    Treatment Cleansed;Debridement (Selective)    Selective Debridement (non-excisional) - Location anterior tibial region wound, dry tissue, slough from woundbed    Selective Debridement (non-excisional) - Tools Used Forceps    Selective Debridement (non-excisional) - Tissue Removed devitalized tissue    Wound Therapy - Clinical Statement see below    Wound Therapy - Functional  Problem List bathing    Factors Delaying/Impairing Wound Healing Vascular compromise    Hydrotherapy Plan Debridement;Dressing change;Electrical stimulation;Patient/family education;Pulsatile lavage with suction;Ultrasonic wound therapy @35  KHz (+/- 3 KHz)    Wound Therapy - Frequency 2X / week    Wound Therapy - Current Recommendations PT    Wound Plan debride and dressing changes PRN    Dressing  xeroform to healed area with medihoney to new outbreak area follow by profore lite.  followed by profore-compression bandaging. SABRA                 PATIENT EDUCATION: Education  details: Patient educated on exam findings, POC, scope of PT, dressing changes if soiled. Person educated: Patient Education method: Explanation Education comprehension: verbalized understanding   HOME EXERCISE PROGRAM:  Ankle pumps  GOALS: Goals reviewed with patient? Yes  SHORT TERM GOALS: Target date: 02/09/2023    Wound to decrease in size by half to demonstrate wound healing. Baseline: Goal status: MET  2.  Wound to be free from slough.  Baseline:  Goal status: MET    LONG TERM GOALS: Target date: 03/02/2023    Wound to be healed to reduce risk of infection Baseline:  Goal status: IN PROGRESS   ASSESSMENT:  CLINICAL IMPRESSION:  PT Large raw area is no longer raw..  Smaller area is healed   lateral area  wounds are now healed but raw.  PT now has multiple pinpoint open areas on her distal LE.  Will continue to see.  Pt has had an extreme difficulty getting these wounds to heal therefore will see one more time to ensure wounds totally heal.  Pt was told to bring her compression garment in next visit .  Continued with debridement for all dry tissue from both locations as well as slough inside wound bed.   Cleansed and applied vaseline to perimeter and xeroform to help increase moisture. Continued with profore lite for compression.    OBJECTIVE IMPAIRMENTS: Abnormal gait, decreased activity tolerance, decreased balance, decreased endurance, increased edema, improper body mechanics, and pain.   ACTIVITY LIMITATIONS: bending, standing, squatting, bathing, hygiene/grooming, locomotion level, and caring for others  PARTICIPATION LIMITATIONS: meal prep, cleaning, laundry, shopping, community activity, and yard work  PERSONAL FACTORS: Fitness, Past/current experiences, Time since onset of injury/illness/exacerbation, and 3+ comorbidities: varicose veins, HTN , venous insufficiency  are also affecting patient's functional outcome.   REHAB POTENTIAL: Good  CLINICAL DECISION  MAKING: Stable/uncomplicated  EVALUATION COMPLEXITY: Low  PLAN: PT FREQUENCY: 1x/week decrease to one time a week for two more weeks including todays treatment.   PT DURATION: 6 weeks total of 15 weeks   PLANNED INTERVENTIONS: Therapeutic exercises, Therapeutic activity, Neuromuscular re-education, Balance training, Gait training, Patient/Family education, Joint manipulation, Joint mobilization, Stair training, Orthotic/Fit training, DME instructions, Aquatic Therapy, Dry Needling, Electrical stimulation, Spinal manipulation, Spinal mobilization, Cryotherapy, Moist heat, Compression bandaging, scar mobilization, Splintting, Taping, Traction, Ultrasound, Ionotophoresis 4mg /ml Dexamethasone , and Manual therapy   PLAN FOR NEXT SESSION: continue to promote a healthy environment for wound healing.     Montie Metro, PT CLT [phone]    06/06/2023, 1:21 PM

## 2023-06-09 ENCOUNTER — Ambulatory Visit (HOSPITAL_COMMUNITY): Payer: Medicare PPO

## 2023-06-09 ENCOUNTER — Encounter (HOSPITAL_COMMUNITY): Payer: Self-pay

## 2023-06-09 DIAGNOSIS — I872 Venous insufficiency (chronic) (peripheral): Secondary | ICD-10-CM

## 2023-06-09 DIAGNOSIS — S81802D Unspecified open wound, left lower leg, subsequent encounter: Secondary | ICD-10-CM

## 2023-06-09 DIAGNOSIS — R2689 Other abnormalities of gait and mobility: Secondary | ICD-10-CM | POA: Diagnosis not present

## 2023-06-09 NOTE — Therapy (Signed)
 OUTPATIENT PHYSICAL THERAPY WOUND TREATMENT    PCP: Bertell Satterfield, MD REFERRING PROVIDER: Bertell Satterfield, MD  END OF SESSION:   PT End of Session - 06/09/23 0943     Visit Number 31    Number of Visits 33    Date for PT Re-Evaluation 06/22/23    Authorization Type [employer]    Authorization Time Period cohere approved 5 visits 06/02/23-->06/23/2023    Authorization - Visit Number 3    Authorization - Number of Visits 5    Progress Note Due on Visit 33    PT Start Time 0855    PT Stop Time 0930    PT Time Calculation (min) 35 min    Activity Tolerance Patient tolerated treatment well    Behavior During Therapy WFL for tasks assessed/performed                  Past Medical History:  Diagnosis Date   Hypertension    Past Surgical History:  Procedure Laterality Date   BREAST SURGERY     lumpectomy   CYSTOSCOPY WITH STENT PLACEMENT Left 03/09/2017   Procedure: CYSTOSCOPY, RETROGRADE PYELOGRAM WITH STENT PLACEMENT;  Surgeon: Gaston Hamilton, MD;  Location: [facility];  Service: Urology;  Laterality: Left;   Patient Active Problem List   Diagnosis Date Noted   Sepsis (HCC) 03/09/2017   Ureteral stone with hydronephrosis    Hypertension 03/08/2017   Sepsis secondary to UTI (HCC) 03/08/2017   Urinary tract obstruction by kidney stone 03/08/2017    ONSET DATE: 1 month approx.  REFERRING DIAG: L03.119 (ICD-10-CM) - Cellulitis of unspecified part of limb I83.009 (ICD-10-CM) - Varicose veins of unspecified lower extremity with ulcer of unspecified site I87.2 (ICD-10-CM) - Venous insufficiency (chronic) (peripheral)  THERAPY DIAG:  Peripheral venous insufficiency  Multiple open wounds of lower leg, left, subsequent encounter  Other abnormalities of gait and mobility  Rationale for Evaluation and Treatment: Rehabilitation   Wound Therapy - 06/09/23 0001     Subjective No pain, dressings intact.    Patient and Family Stated Goals wound to heal    Date of  Onset 12/19/22    Prior Treatments antibiotics    Pain Scale 0-10    Pain Score 0-No pain    Evaluation and Treatment Procedures Explained to Patient/Family Yes    Evaluation and Treatment Procedures agreed to    Wound Properties Date First Assessed: 01/19/23 Time First Assessed: 1345 Wound Type: Venous stasis ulcer Location: Pretibial Location Orientation: Left Wound Description (Comments): anterior tibial wound Present on Admission: Yes   Dressing Type Gauze (Comment);Compression wrap   lotion/vaseline, medihoney, 2x2, cotton liner, profore lite   Dressing Changed Changed    Dressing Status Old drainage    Dressing Change Frequency PRN    Site / Wound Assessment Red    % Wound base Red or Granulating 100%    Peri-wound Assessment Erythema (non-blanchable)    Drainage Amount Scant    Drainage Description Serous    Treatment Cleansed;Debridement (Selective)    Selective Debridement (non-excisional) - Location anterior tibial region wound, dry tissue, slough from woundbed    Selective Debridement (non-excisional) - Tools Used Forceps    Selective Debridement (non-excisional) - Tissue Removed devitalized tissue    Wound Therapy - Clinical Statement see below    Wound Therapy - Functional Problem List bathing    Factors Delaying/Impairing Wound Healing Vascular compromise    Hydrotherapy Plan Debridement;Dressing change;Electrical stimulation;Patient/family education;Pulsatile lavage with suction;Ultrasonic wound therapy @35  KHz (+/-  3 KHz)    Wound Therapy - Frequency 2X / week    Wound Therapy - Current Recommendations PT    Wound Plan debride and dressing changes PRN    Dressing  lotion/vaseline, medihoney, 2x2, cotton liner, profore lite                 PATIENT EDUCATION: Education details: Patient educated on exam findings, POC, scope of PT, dressing changes if soiled. Person educated: Patient Education method: Explanation Education comprehension: verbalized  understanding   HOME EXERCISE PROGRAM:  Ankle pumps  GOALS: Goals reviewed with patient? Yes  SHORT TERM GOALS: Target date: 02/09/2023    Wound to decrease in size by half to demonstrate wound healing. Baseline: Goal status: MET  2.  Wound to be free from slough.  Baseline:  Goal status: MET    LONG TERM GOALS: Target date: 03/02/2023    Wound to be healed to reduce risk of infection Baseline:  Goal status: IN PROGRESS   ASSESSMENT:  CLINICAL IMPRESSION:   Wounds present with decreased drainage and depth.  Continues to have raw area superior and multiple pinpoint open areas on distal LE.  Reduced frequency to 1x/ week, anticipate DC to self care next couple apts.  Continued with medihoney and compression for edema control.     OBJECTIVE IMPAIRMENTS: Abnormal gait, decreased activity tolerance, decreased balance, decreased endurance, increased edema, improper body mechanics, and pain.   ACTIVITY LIMITATIONS: bending, standing, squatting, bathing, hygiene/grooming, locomotion level, and caring for others  PARTICIPATION LIMITATIONS: meal prep, cleaning, laundry, shopping, community activity, and yard work  PERSONAL FACTORS: Fitness, Past/current experiences, Time since onset of injury/illness/exacerbation, and 3+ comorbidities: varicose veins, HTN , venous insufficiency  are also affecting patient's functional outcome.   REHAB POTENTIAL: Good  CLINICAL DECISION MAKING: Stable/uncomplicated  EVALUATION COMPLEXITY: Low  PLAN: PT FREQUENCY: 1x/week decrease to one time a week for two more weeks including todays treatment.   PT DURATION: 6 weeks total of 15 weeks   PLANNED INTERVENTIONS: Therapeutic exercises, Therapeutic activity, Neuromuscular re-education, Balance training, Gait training, Patient/Family education, Joint manipulation, Joint mobilization, Stair training, Orthotic/Fit training, DME instructions, Aquatic Therapy, Dry Needling, Electrical stimulation,  Spinal manipulation, Spinal mobilization, Cryotherapy, Moist heat, Compression bandaging, scar mobilization, Splintting, Taping, Traction, Ultrasound, Ionotophoresis 4mg /ml Dexamethasone , and Manual therapy   PLAN FOR NEXT SESSION: continue to promote a healthy environment for wound healing.     Augustin Mclean, LPTA/CLT; CBIS [phone]  Mclean Augustin Amble, PTA 06/09/2023, 9:50 AM  06/09/2023, 9:50 AM

## 2023-06-13 ENCOUNTER — Ambulatory Visit (HOSPITAL_COMMUNITY): Payer: Medicare PPO | Admitting: Physical Therapy

## 2023-06-13 DIAGNOSIS — R2689 Other abnormalities of gait and mobility: Secondary | ICD-10-CM

## 2023-06-13 DIAGNOSIS — S81802D Unspecified open wound, left lower leg, subsequent encounter: Secondary | ICD-10-CM

## 2023-06-13 DIAGNOSIS — I872 Venous insufficiency (chronic) (peripheral): Secondary | ICD-10-CM | POA: Diagnosis not present

## 2023-06-13 NOTE — Therapy (Signed)
 OUTPATIENT PHYSICAL THERAPY WOUND TREATMENT    PCP: Bertell Satterfield, MD REFERRING PROVIDER: Bertell Satterfield, MD  END OF SESSION:   PT End of Session - 06/13/23 1651     Visit Number 32    Number of Visits 33    Date for PT Re-Evaluation 06/22/23    Authorization Type [employer]    Authorization Time Period cohere approved 5 visits 06/02/23-->06/23/2023    Authorization - Visit Number 4    Authorization - Number of Visits 5    Progress Note Due on Visit 33    PT Start Time 0945    PT Stop Time 1015    PT Time Calculation (min) 30 min    Activity Tolerance Patient tolerated treatment well    Behavior During Therapy WFL for tasks assessed/performed                  Past Medical History:  Diagnosis Date   Hypertension    Past Surgical History:  Procedure Laterality Date   BREAST SURGERY     lumpectomy   CYSTOSCOPY WITH STENT PLACEMENT Left 03/09/2017   Procedure: CYSTOSCOPY, RETROGRADE PYELOGRAM WITH STENT PLACEMENT;  Surgeon: Gaston Hamilton, MD;  Location: [facility];  Service: Urology;  Laterality: Left;   Patient Active Problem List   Diagnosis Date Noted   Sepsis (HCC) 03/09/2017   Ureteral stone with hydronephrosis    Hypertension 03/08/2017   Sepsis secondary to UTI (HCC) 03/08/2017   Urinary tract obstruction by kidney stone 03/08/2017    ONSET DATE: 1 month approx.  REFERRING DIAG: L03.119 (ICD-10-CM) - Cellulitis of unspecified part of limb I83.009 (ICD-10-CM) - Varicose veins of unspecified lower extremity with ulcer of unspecified site I87.2 (ICD-10-CM) - Venous insufficiency (chronic) (peripheral)  THERAPY DIAG:  Peripheral venous insufficiency  Multiple open wounds of lower leg, left, subsequent encounter  Other abnormalities of gait and mobility  Rationale for Evaluation and Treatment: Rehabilitation   Wound Therapy - 06/13/23 1652     Subjective doing well hoping it's healed    Patient and Family Stated Goals wound to heal    Date of  Onset 12/19/22    Prior Treatments antibiotics    Pain Scale 0-10    Pain Score 0-No pain    Evaluation and Treatment Procedures Explained to Patient/Family Yes    Evaluation and Treatment Procedures agreed to    Wound Properties Date First Assessed: 01/19/23 Time First Assessed: 1345 Wound Type: Venous stasis ulcer Location: Pretibial Location Orientation: Left Wound Description (Comments): anterior tibial wound Present on Admission: Yes   Wound Image Images linked: 1    Dressing Type Gauze (Comment);Compression wrap   lotion/vaseline, medihoney, 2x2, cotton liner, profore lite   Dressing Changed Changed    Dressing Status Old drainage    Dressing Change Frequency PRN    Site / Wound Assessment Red    % Wound base Red or Granulating 100%    Peri-wound Assessment Erythema (non-blanchable)    Drainage Amount Scant    Drainage Description Serous    Treatment Cleansed;Debridement (Selective)    Selective Debridement (non-excisional) - Location anterior tibial region wound, dry tissue, slough from woundbed    Selective Debridement (non-excisional) - Tools Used Forceps    Selective Debridement (non-excisional) - Tissue Removed devitalized tissue    Wound Therapy - Clinical Statement see below    Wound Therapy - Functional Problem List bathing    Factors Delaying/Impairing Wound Healing Vascular compromise    Hydrotherapy Plan Debridement;Dressing change;Electrical stimulation;Patient/family  education;Pulsatile lavage with suction;Ultrasonic wound therapy @35  KHz (+/- 3 KHz)    Wound Therapy - Frequency 2X / week    Wound Therapy - Current Recommendations PT    Wound Plan debride and dressing changes PRN    Dressing  lotion/vaseline, 4X4,profore lite                  PATIENT EDUCATION: Education details: Patient educated on exam findings, POC, scope of PT, dressing changes if soiled. Person educated: Patient Education method: Explanation Education comprehension: verbalized  understanding   HOME EXERCISE PROGRAM:  Ankle pumps  GOALS: Goals reviewed with patient? Yes  SHORT TERM GOALS: Target date: 02/09/2023    Wound to decrease in size by half to demonstrate wound healing. Baseline: Goal status: MET  2.  Wound to be free from slough.  Baseline:  Goal status: MET    LONG TERM GOALS: Target date: 03/02/2023    Wound to be healed to reduce risk of infection Baseline:  Goal status: IN PROGRESS   ASSESSMENT:  CLINICAL IMPRESSION:   Area still raw beneath dry tissue that was debrided with drainage on bandages.  Cleansed well and moisturized prior to reapplying compression bandaging. No dressing placed in area other than vaseline beneath gauze.  Still anticipate DC to self care next couple apts.    OBJECTIVE IMPAIRMENTS: Abnormal gait, decreased activity tolerance, decreased balance, decreased endurance, increased edema, improper body mechanics, and pain.   ACTIVITY LIMITATIONS: bending, standing, squatting, bathing, hygiene/grooming, locomotion level, and caring for others  PARTICIPATION LIMITATIONS: meal prep, cleaning, laundry, shopping, community activity, and yard work  PERSONAL FACTORS: Fitness, Past/current experiences, Time since onset of injury/illness/exacerbation, and 3+ comorbidities: varicose veins, HTN , venous insufficiency  are also affecting patient's functional outcome.   REHAB POTENTIAL: Good  CLINICAL DECISION MAKING: Stable/uncomplicated  EVALUATION COMPLEXITY: Low  PLAN: PT FREQUENCY: 1x/week decrease to one time a week for two more weeks including todays treatment.   PT DURATION: 6 weeks total of 15 weeks   PLANNED INTERVENTIONS: Therapeutic exercises, Therapeutic activity, Neuromuscular re-education, Balance training, Gait training, Patient/Family education, Joint manipulation, Joint mobilization, Stair training, Orthotic/Fit training, DME instructions, Aquatic Therapy, Dry Needling, Electrical stimulation, Spinal  manipulation, Spinal mobilization, Cryotherapy, Moist heat, Compression bandaging, scar mobilization, Splintting, Taping, Traction, Ultrasound, Ionotophoresis 4mg /ml Dexamethasone , and Manual therapy   PLAN FOR NEXT SESSION: continue to promote a healthy environment for wound healing.      Vivian No B, PTA 06/13/2023, 4:54 PM  06/13/2023, 4:54 PM

## 2023-06-16 ENCOUNTER — Ambulatory Visit (HOSPITAL_COMMUNITY): Payer: Medicare PPO

## 2023-06-20 ENCOUNTER — Ambulatory Visit (HOSPITAL_COMMUNITY): Payer: Medicare PPO | Admitting: Physical Therapy

## 2023-06-20 DIAGNOSIS — R2689 Other abnormalities of gait and mobility: Secondary | ICD-10-CM

## 2023-06-20 DIAGNOSIS — I872 Venous insufficiency (chronic) (peripheral): Secondary | ICD-10-CM | POA: Diagnosis not present

## 2023-06-20 DIAGNOSIS — S81802D Unspecified open wound, left lower leg, subsequent encounter: Secondary | ICD-10-CM

## 2023-06-20 NOTE — Therapy (Addendum)
OUTPATIENT PHYSICAL THERAPY WOUND TREATMENT/Discharge    PCP: Elfredia Nevins, MD REFERRING PROVIDER: Elfredia Nevins, MD PHYSICAL THERAPY DISCHARGE SUMMARY  Visits from Start of Care: 33  Current functional level related to goals / functional outcomes: Increased redness wound will almost heal and then break open again recommend dermatologist    Remaining deficits: Open wound   Education / Equipment: Keep compression on to prevent wound from increasing in size    Patient agrees to discharge. Patient goals were not met. Patient is being discharged due to lack of progress.  END OF SESSION:   PT End of Session - 06/20/23 1229     Visit Number 33    Number of Visits 33    Date for PT Re-Evaluation 06/22/23    Authorization Type [employer]    Authorization Time Period cohere approved 5 visits 06/02/23-->06/23/2023    Authorization - Visit Number 5    Authorization - Number of Visits 5    Progress Note Due on Visit 33    PT Start Time 1150    PT Stop Time 1223    PT Time Calculation (min) 33 min    Activity Tolerance Patient tolerated treatment well    Behavior During Therapy WFL for tasks assessed/performed                  Past Medical History:  Diagnosis Date   Hypertension    Past Surgical History:  Procedure Laterality Date   BREAST SURGERY     lumpectomy   CYSTOSCOPY WITH STENT PLACEMENT Left 03/09/2017   Procedure: CYSTOSCOPY, RETROGRADE PYELOGRAM WITH STENT PLACEMENT;  Surgeon: Alfredo Martinez, MD;  Location: [facility];  Service: Urology;  Laterality: Left;   Patient Active Problem List   Diagnosis Date Noted   Sepsis (HCC) 03/09/2017   Ureteral stone with hydronephrosis    Hypertension 03/08/2017   Sepsis secondary to UTI (HCC) 03/08/2017   Urinary tract obstruction by kidney stone 03/08/2017    ONSET DATE: 1 month approx.  REFERRING DIAG: L03.119 (ICD-10-CM) - Cellulitis of unspecified part of limb I83.009 (ICD-10-CM) - Varicose veins of  unspecified lower extremity with ulcer of unspecified site I87.2 (ICD-10-CM) - Venous insufficiency (chronic) (peripheral)  THERAPY DIAG:  Peripheral venous insufficiency  Multiple open wounds of lower leg, left, subsequent encounter  Other abnormalities of gait and mobility  Rationale for Evaluation and Treatment: Rehabilitation   Wound Therapy - 06/20/23 1230     Subjective no pain or issues    Patient and Family Stated Goals wound to heal    Date of Onset 12/19/22    Prior Treatments antibiotics    Pain Scale 0-10    Pain Score 0-No pain    Evaluation and Treatment Procedures Explained to Patient/Family Yes    Evaluation and Treatment Procedures agreed to    Wound Properties Date First Assessed: 01/19/23 Time First Assessed: 1345 Wound Type: Venous stasis ulcer Location: Pretibial Location Orientation: Left Wound Description (Comments): anterior tibial wound Present on Admission: Yes   Wound Image Images linked: 3    Dressing Type Gauze (Comment);Compression wrap   lotion/vaseline, medihoney, 2x2, cotton liner, profore lite   Dressing Changed Changed    Dressing Status Old drainage    Dressing Change Frequency PRN    Site / Wound Assessment Red    % Wound base Red or Granulating 100%   after debridement but with crusty and slough initially   Peri-wound Assessment Erythema (non-blanchable)    Drainage Amount Scant  Drainage Description Serous    Treatment Cleansed;Debridement (Selective)    Selective Debridement (non-excisional) - Location anterior tibial region wound, dry tissue, slough from woundbed    Selective Debridement (non-excisional) - Tools Used Forceps    Selective Debridement (non-excisional) - Tissue Removed devitalized tissue    Wound Therapy - Clinical Statement see below    Wound Therapy - Functional Problem List bathing    Factors Delaying/Impairing Wound Healing Vascular compromise    Hydrotherapy Plan Debridement;Dressing change;Electrical  stimulation;Patient/family education;Pulsatile lavage with suction;Ultrasonic wound therapy @35  KHz (+/- 3 KHz)    Wound Therapy - Frequency 2X / week    Wound Therapy - Current Recommendations PT    Wound Plan debride and dressing changes PRN    Dressing  xeroform, kerlix, #5 netting                  PATIENT EDUCATION: Education details: Patient educated on exam findings, POC, scope of PT, dressing changes if soiled. Person educated: Patient Education method: Explanation Education comprehension: verbalized understanding   HOME EXERCISE PROGRAM:  Ankle pumps  GOALS: Goals reviewed with patient? Yes  SHORT TERM GOALS: Target date: 02/09/2023    Wound to decrease in size by half to demonstrate wound healing. Baseline: Goal status: MET  2.  Wound to be free from slough.  Baseline:  Goal status: NOT MET    LONG TERM GOALS: Target date: 03/02/2023    Wound to be healed to reduce risk of infection Baseline:  Goal status: IN PROGRESS   ASSESSMENT:  CLINICAL IMPRESSION:   Area continues to present with dry patches in combination with white sloughy regions (see pic before and after debridement).  This occurs every treatment with no real improvement or approximation.  Today, increased redness and spreading of areas (see pictures for comparison in Media).  Pt has been coming for 5 months at this point and no real change noted in the past month.  Suggested she return to MD for possible dermatology or infectious disease consult as area is not responding to current treatment.     OBJECTIVE IMPAIRMENTS: Abnormal gait, decreased activity tolerance, decreased balance, decreased endurance, increased edema, improper body mechanics, and pain.   ACTIVITY LIMITATIONS: bending, standing, squatting, bathing, hygiene/grooming, locomotion level, and caring for others  PARTICIPATION LIMITATIONS: meal prep, cleaning, laundry, shopping, community activity, and yard work  PERSONAL  FACTORS: Fitness, Past/current experiences, Time since onset of injury/illness/exacerbation, and 3+ comorbidities: varicose veins, HTN , venous insufficiency  are also affecting patient's functional outcome.   REHAB POTENTIAL: Good  CLINICAL DECISION MAKING: Stable/uncomplicated  EVALUATION COMPLEXITY: Low  PLAN: PT FREQUENCY: 1x/week decrease to one time a week for two more weeks including todays treatment.   PT DURATION: 6 weeks total of 15 weeks   PLANNED INTERVENTIONS: Therapeutic exercises, Therapeutic activity, Neuromuscular re-education, Balance training, Gait training, Patient/Family education, Joint manipulation, Joint mobilization, Stair training, Orthotic/Fit training, DME instructions, Aquatic Therapy, Dry Needling, Electrical stimulation, Spinal manipulation, Spinal mobilization, Cryotherapy, Moist heat, Compression bandaging, scar mobilization, Splintting, Taping, Traction, Ultrasound, Ionotophoresis 4mg /ml Dexamethasone, and Manual therapy   PLAN FOR NEXT SESSION: referral back to MD/dermatologist or infectious disease; discharge as no change  Lurena Nida, PTA 06/20/2023, 12:32 PM  Virgina Organ, PT CLT [phone]

## 2023-06-23 ENCOUNTER — Ambulatory Visit (HOSPITAL_COMMUNITY): Payer: Medicare PPO

## 2023-06-27 ENCOUNTER — Ambulatory Visit (HOSPITAL_COMMUNITY): Payer: Medicare PPO | Admitting: Physical Therapy

## 2023-07-07 DIAGNOSIS — Z6833 Body mass index (BMI) 33.0-33.9, adult: Secondary | ICD-10-CM | POA: Diagnosis not present

## 2023-07-07 DIAGNOSIS — R21 Rash and other nonspecific skin eruption: Secondary | ICD-10-CM | POA: Diagnosis not present

## 2023-07-07 DIAGNOSIS — E6609 Other obesity due to excess calories: Secondary | ICD-10-CM | POA: Diagnosis not present

## 2023-07-07 DIAGNOSIS — R600 Localized edema: Secondary | ICD-10-CM | POA: Diagnosis not present

## 2023-07-17 DIAGNOSIS — S81802A Unspecified open wound, left lower leg, initial encounter: Secondary | ICD-10-CM | POA: Diagnosis not present

## 2023-07-17 DIAGNOSIS — I872 Venous insufficiency (chronic) (peripheral): Secondary | ICD-10-CM | POA: Diagnosis not present

## 2024-03-29 ENCOUNTER — Ambulatory Visit: Admission: EM | Admit: 2024-03-29 | Discharge: 2024-03-29 | Disposition: A | Discharge status: Home / Self Care

## 2024-03-29 ENCOUNTER — Other Ambulatory Visit: Payer: Self-pay

## 2024-03-29 ENCOUNTER — Observation Stay (HOSPITAL_COMMUNITY)
Admission: EM | Admit: 2024-03-29 | Discharge: 2024-04-02 | Disposition: A | Source: Ambulatory Visit | Attending: Hospitalist | Admitting: Hospitalist

## 2024-03-29 ENCOUNTER — Encounter (HOSPITAL_COMMUNITY): Payer: Self-pay

## 2024-03-29 ENCOUNTER — Emergency Department (HOSPITAL_COMMUNITY)

## 2024-03-29 DIAGNOSIS — D500 Iron deficiency anemia secondary to blood loss (chronic): Secondary | ICD-10-CM | POA: Diagnosis present

## 2024-03-29 DIAGNOSIS — R195 Other fecal abnormalities: Secondary | ICD-10-CM

## 2024-03-29 DIAGNOSIS — I10 Essential (primary) hypertension: Secondary | ICD-10-CM | POA: Diagnosis not present

## 2024-03-29 DIAGNOSIS — K2950 Unspecified chronic gastritis without bleeding: Secondary | ICD-10-CM | POA: Insufficient documentation

## 2024-03-29 DIAGNOSIS — E66811 Obesity, class 1: Secondary | ICD-10-CM | POA: Diagnosis not present

## 2024-03-29 DIAGNOSIS — K449 Diaphragmatic hernia without obstruction or gangrene: Secondary | ICD-10-CM | POA: Diagnosis not present

## 2024-03-29 DIAGNOSIS — I4891 Unspecified atrial fibrillation: Principal | ICD-10-CM

## 2024-03-29 DIAGNOSIS — K922 Gastrointestinal hemorrhage, unspecified: Secondary | ICD-10-CM | POA: Diagnosis not present

## 2024-03-29 DIAGNOSIS — K921 Melena: Secondary | ICD-10-CM | POA: Diagnosis not present

## 2024-03-29 DIAGNOSIS — R0602 Shortness of breath: Secondary | ICD-10-CM

## 2024-03-29 DIAGNOSIS — Z23 Encounter for immunization: Secondary | ICD-10-CM | POA: Insufficient documentation

## 2024-03-29 DIAGNOSIS — N3000 Acute cystitis without hematuria: Secondary | ICD-10-CM | POA: Diagnosis not present

## 2024-03-29 DIAGNOSIS — I4819 Other persistent atrial fibrillation: Secondary | ICD-10-CM | POA: Diagnosis not present

## 2024-03-29 DIAGNOSIS — R531 Weakness: Secondary | ICD-10-CM

## 2024-03-29 DIAGNOSIS — Z79899 Other long term (current) drug therapy: Secondary | ICD-10-CM | POA: Diagnosis not present

## 2024-03-29 DIAGNOSIS — D649 Anemia, unspecified: Secondary | ICD-10-CM

## 2024-03-29 DIAGNOSIS — Z6832 Body mass index (BMI) 32.0-32.9, adult: Secondary | ICD-10-CM | POA: Insufficient documentation

## 2024-03-29 DIAGNOSIS — K254 Chronic or unspecified gastric ulcer with hemorrhage: Secondary | ICD-10-CM | POA: Diagnosis not present

## 2024-03-29 DIAGNOSIS — K317 Polyp of stomach and duodenum: Secondary | ICD-10-CM | POA: Diagnosis not present

## 2024-03-29 DIAGNOSIS — D62 Acute posthemorrhagic anemia: Secondary | ICD-10-CM

## 2024-03-29 DIAGNOSIS — I480 Paroxysmal atrial fibrillation: Secondary | ICD-10-CM | POA: Diagnosis not present

## 2024-03-29 DIAGNOSIS — K648 Other hemorrhoids: Secondary | ICD-10-CM | POA: Insufficient documentation

## 2024-03-29 LAB — CBC WITH DIFFERENTIAL/PLATELET
Abs Immature Granulocytes: 0.02 K/uL (ref 0.00–0.07)
Basophils Absolute: 0.1 K/uL (ref 0.0–0.1)
Basophils Relative: 1 %
Eosinophils Absolute: 0.2 K/uL (ref 0.0–0.5)
Eosinophils Relative: 2 %
HCT: 28.9 % — ABNORMAL LOW (ref 36.0–46.0)
Hemoglobin: 8.9 g/dL — ABNORMAL LOW (ref 12.0–15.0)
Immature Granulocytes: 0 %
Lymphocytes Relative: 15 %
Lymphs Abs: 1.2 K/uL (ref 0.7–4.0)
MCH: 28.4 pg (ref 26.0–34.0)
MCHC: 30.8 g/dL (ref 30.0–36.0)
MCV: 92.3 fL (ref 80.0–100.0)
Monocytes Absolute: 0.7 K/uL (ref 0.1–1.0)
Monocytes Relative: 9 %
Neutro Abs: 5.9 K/uL (ref 1.7–7.7)
Neutrophils Relative %: 73 %
Platelets: 267 K/uL (ref 150–400)
RBC: 3.13 MIL/uL — ABNORMAL LOW (ref 3.87–5.11)
RDW: 14.4 % (ref 11.5–15.5)
WBC: 8 K/uL (ref 4.0–10.5)
nRBC: 0 % (ref 0.0–0.2)

## 2024-03-29 LAB — COMPREHENSIVE METABOLIC PANEL WITH GFR
ALT: 29 U/L (ref 0–44)
AST: 26 U/L (ref 15–41)
Albumin: 4.1 g/dL (ref 3.5–5.0)
Alkaline Phosphatase: 98 U/L (ref 38–126)
Anion gap: 11 (ref 5–15)
BUN: 22 mg/dL (ref 8–23)
CO2: 25 mmol/L (ref 22–32)
Calcium: 9.1 mg/dL (ref 8.9–10.3)
Chloride: 103 mmol/L (ref 98–111)
Creatinine, Ser: 0.86 mg/dL (ref 0.44–1.00)
GFR, Estimated: >60 mL/min (ref >60)
Glucose, Bld: 112 mg/dL — ABNORMAL HIGH (ref 70–99)
Potassium: 4.3 mmol/L (ref 3.5–5.1)
Sodium: 138 mmol/L (ref 135–145)
Total Bilirubin: 0.4 mg/dL (ref 0.0–1.2)
Total Protein: 6.7 g/dL (ref 6.5–8.1)

## 2024-03-29 LAB — URINALYSIS, ROUTINE W REFLEX MICROSCOPIC
Bilirubin Urine: NEGATIVE
Glucose, UA: NEGATIVE mg/dL
Ketones, ur: NEGATIVE mg/dL
Nitrite: POSITIVE — AB
Protein, ur: NEGATIVE mg/dL
Specific Gravity, Urine: 1.006 (ref 1.005–1.030)
pH: 6 (ref 5.0–8.0)

## 2024-03-29 LAB — TROPONIN T, HIGH SENSITIVITY
Troponin T High Sensitivity: <15 ng/L (ref 0–19)
Troponin T High Sensitivity: <15 ng/L (ref 0–19)

## 2024-03-29 LAB — POC OCCULT BLOOD, ED: Fecal Occult Bld: POSITIVE — AB

## 2024-03-29 LAB — MAGNESIUM: Magnesium: 2.5 mg/dL — ABNORMAL HIGH (ref 1.7–2.4)

## 2024-03-29 LAB — TSH: TSH: 1.46 u[IU]/mL (ref 0.350–4.500)

## 2024-03-29 MED ORDER — PNEUMOCOCCAL 20-VAL CONJ VACC 0.5 ML IM SUSY
0.5000 mL | PREFILLED_SYRINGE | INTRAMUSCULAR | Status: AC
  Administered 2024-04-02: 0.5 mL via INTRAMUSCULAR
  Filled 2024-03-29: qty 0.5

## 2024-03-29 MED ORDER — INFLUENZA VAC SPLIT HIGH-DOSE 0.5 ML IM SUSY
0.5000 mL | PREFILLED_SYRINGE | INTRAMUSCULAR | Status: AC
  Administered 2024-04-02: 0.5 mL via INTRAMUSCULAR
  Filled 2024-03-29: qty 0.5

## 2024-03-29 MED ORDER — CEFTRIAXONE SODIUM 1 G IJ SOLR
1.0000 g | Freq: Once | INTRAMUSCULAR | Status: AC
  Administered 2024-03-29: 1 g via INTRAVENOUS
  Filled 2024-03-29: qty 10

## 2024-03-29 NOTE — ED Provider Notes (Signed)
 [place] EMERGENCY DEPARTMENT AT [facility] Provider Note   CSN: [id number] Arrival date & time: 03/29/24  1807     Patient presents with: Atrial Fibrillation   Jody Hernandez is a 73 y.o. female.   Patient is a 73 year old female who presents to the emergency department the chief complaint of generalized weakness which has been progressively worsening over the past week with associated palpitations and a sensation as though her heart is racing.  She was evaluated in urgent care just prior to arrival and sent to the emergency department for further evaluation.  She was noted to be in atrial fibrillation with RVR there.  EMS did give her 10 mg of diltiazem and patient notes that she does feel better at this time.  She notes that she has had no associated chest pain or shortness of breath.  She denies any dizziness, lightheadedness or syncope.  She denies any associated melena or hematochezia.   Atrial Fibrillation       Prior to Admission medications   Medication Sig Start Date End Date Taking? Authorizing Provider  lisinopril (PRINIVIL,ZESTRIL) 20 MG tablet Take 20 mg by mouth daily.    [provider]    Allergies: Patient has no known allergies.    Review of Systems  Constitutional:  Positive for fatigue.  All other systems reviewed and are negative.   Updated Vital Signs BP (!) 171/91   Pulse 81   Temp 98.4 F (36.9 C) (Oral)   Resp (!) 23   Ht 5' 2 (1.575 m)   Wt 81.6 kg   SpO2 97%   BMI 32.92 kg/m   Physical Exam Vitals and nursing note reviewed. Exam conducted with a chaperone present.  Constitutional:      General: She is not in acute distress.    Appearance: Normal appearance. She is not ill-appearing.  HENT:     Head: Normocephalic and atraumatic.     Nose: Nose normal.     Mouth/Throat:     Mouth: Mucous membranes are moist.  Eyes:     Extraocular Movements: Extraocular movements intact.     Conjunctiva/sclera: Conjunctivae  normal.     Pupils: Pupils are equal, round, and reactive to light.  Cardiovascular:     Rate and Rhythm: Normal rate. Rhythm irregular.     Pulses: Normal pulses.     Heart sounds: Normal heart sounds. No murmur heard.    No gallop.  Pulmonary:     Effort: Pulmonary effort is normal. No respiratory distress.     Breath sounds: Normal breath sounds. No stridor. No wheezing, rhonchi or rales.  Abdominal:     General: Abdomen is flat. Bowel sounds are normal. There is no distension.     Palpations: Abdomen is soft.     Tenderness: There is no abdominal tenderness. There is no guarding.  Genitourinary:    Comments: Brown stool in the rectal vault, Hemoccult positive Musculoskeletal:        General: Normal range of motion.     Cervical back: Normal range of motion and neck supple.  Skin:    General: Skin is warm and dry.  Neurological:     General: No focal deficit present.     Mental Status: She is alert and oriented to person, place, and time. Mental status is at baseline.     Cranial Nerves: No cranial nerve deficit.     Sensory: No sensory deficit.     Motor: No weakness.  Coordination: Coordination normal.     Gait: Gait normal.  Psychiatric:        Mood and Affect: Mood normal.        Behavior: Behavior normal.        Thought Content: Thought content normal.        Judgment: Judgment normal.     (all labs ordered are listed, but only abnormal results are displayed) Labs Reviewed  COMPREHENSIVE METABOLIC PANEL WITH GFR - Abnormal; Notable for the following components:      Result Value   Glucose, Bld 112 (*)    All other components within normal limits  CBC WITH DIFFERENTIAL/PLATELET - Abnormal; Notable for the following components:   RBC 3.13 (*)    Hemoglobin 8.9 (*)    HCT 28.9 (*)    All other components within normal limits  URINALYSIS, ROUTINE W REFLEX MICROSCOPIC - Abnormal; Notable for the following components:   APPearance CLOUDY (*)    Hgb urine  dipstick LARGE (*)    Nitrite POSITIVE (*)    Leukocytes,Ua MODERATE (*)    Bacteria, UA MANY (*)    All other components within normal limits  MAGNESIUM - Abnormal; Notable for the following components:   Magnesium 2.5 (*)    All other components within normal limits  POC OCCULT BLOOD, ED - Abnormal; Notable for the following components:   Fecal Occult Bld POSITIVE (*)    All other components within normal limits  URINE CULTURE  TSH  TROPONIN T, HIGH SENSITIVITY  TROPONIN T, HIGH SENSITIVITY    EKG: None  Radiology: DG Chest Port 1 View Result Date: 03/29/2024 EXAM: 1 VIEW(S) XRAY OF THE CHEST 03/29/2024 07:24:20 PM COMPARISON: 03/15/2007 CLINICAL HISTORY: dyspnea dyspnea FINDINGS: LUNGS AND PLEURA: No focal pulmonary opacity. No pulmonary edema. No pleural effusion. No pneumothorax. HEART AND MEDIASTINUM: Mild cardiomegaly. Large  hiatal hernia. BONES AND SOFT TISSUES: No acute osseous abnormality. IMPRESSION: 1. No acute cardiopulmonary abnormality. 2. Mild cardiomegaly. 3. Hiatal hernia. Electronically signed by: Franky Crease MD 03/29/2024 07:27 PM EDT RP Workstation: [id number]     Procedures   Medications Ordered in the ED  cefTRIAXone  (ROCEPHIN ) 1 g in sodium chloride  0.9 % 100 mL IVPB (1 g Intravenous New Bag/Given 03/29/24 2124)    Clinical Course as of 03/29/24 2234  Fri Mar 29, 2024  2221 CHA2DS2-VASc score of 2 [CR]    Clinical Course User Index [CR] Jody Lonni BIRCH, PA-C                                 Medical Decision Making Amount and/or Complexity of Data Reviewed Labs: ordered. Radiology: ordered.  Risk Decision regarding hospitalization.   This patient presents to the ED for concern of palpitations, fatigue, this involves an extensive number of treatment options, and is a complaint that carries with it a high risk of complications and morbidity.  The differential diagnosis includes atrial fibrillation, anemia, acute kidney injury, urinary  tract infection, pneumonia, sepsis, ACS, pulmonary embolus, GI bleed   Co morbidities that complicate the patient evaluation  Hypertension, history of urosepsis   Additional history obtained:  Additional history obtained from family External records from outside source obtained and reviewed including medical records   Lab Tests:  I Ordered, and personally interpreted labs.  The pertinent results include: No leukocytosis, worsening anemia, normal kidney function liver function, normal electrolytes, urinalysis with positive nitrite moderate leukocytes, negative troponin  Imaging Studies ordered:  I ordered imaging studies including chest x-ray I independently visualized and interpreted imaging which showed no acute cardiopulmonary process I agree with the radiologist interpretation   Cardiac Monitoring: / EKG:  The patient was maintained on a cardiac monitor.  I personally viewed and interpreted the cardiac monitored which showed an underlying rhythm of: Atrial fibrillation, no ST/T wave changes, no ischemic changes, no STEMI   Consultations Obtained:  I requested consultation with the gastroenterology, Dr. Cinderella,  and discussed lab and imaging findings as well as pertinent plan - they recommend: Will see patient in consult for worsening anemia and Hemoccult positive stool   Problem List / ED Course / Critical interventions / Medication management  Patient is doing well at this time.  Discussed with patient we will plan for admission to the hospitalist service given her new onset A-fib, worsening anemia, Hemoccult positive stool.  Urinalysis is concerning for UTI as well and has been given a dose of Rocephin .  Patient had no gross melena or hematochezia on exam.  Did discuss patient case with gastroenterology who will see the patient in consult.  Patient does have a CHA2DS2-VASc score of 2.  Patient is otherwise asymptomatic at this point.  Have discussed patient case with Dr.  Adefeso with the hospital service who has excepted for admission. I ordered medication including Rocephin  for urinary tract infection Reevaluation of the patient after these medicines showed that the patient improved I have reviewed the patients home medicines and have made adjustments as needed   Social Determinants of Health:  None   Test / Admission - Considered:  Admission     Final diagnoses:  None    ED Discharge Orders     None          Jody Hernandez 03/29/24 2237    Francesca Elsie CROME, MD 04/01/24 [phone]

## 2024-03-29 NOTE — ED Notes (Signed)
 Patient is being discharged from the Urgent Care and sent to the Emergency Department via [facility] . Per PA, patient is in need of higher level of care due to A-fib with RVR. Patient is aware and verbalizes understanding of plan of care.  Vitals:   03/29/24 1715  BP: (!) 167/106  Pulse: (!) 102  Resp: (!) 22  Temp: 97.7 F (36.5 C)  SpO2: 98%

## 2024-03-29 NOTE — ED Triage Notes (Signed)
 Pt brb EMS for Afib from Urgent Care; EMS reports HR on arrival approx. 130, in afib. EMS reports 10 mg diltiazem administered. Pt states she feels better. Pt denies N/V/D, headache; A&Ox4.

## 2024-03-29 NOTE — H&P (Addendum)
 History and Physical    Patient: Jody Hernandez FMW:[id number] DOB: Aug 04, 1950 DOA: 03/29/2024 DOS: the patient was seen and examined on 03/29/2024 PCP: Bertell Satterfield, MD  Patient coming from: Home  Chief Complaint:  Chief Complaint  Patient presents with   Atrial Fibrillation   HPI: Jody Hernandez is a 73 y.o. female with medical history significant of hypertension who presents to the emergency department due to about 2 week onset of generalized weakness and shortness of breath on exertion which has progressively worsened over the past week and this was associated with palpitations with sensation of heart racing.  She went to an urgent care this morning where she was noted to have atrial fibrillation with RVR and EMS was activated.  Diltiazem 10 mg x 1 was given en route to the ED.  Patient states that she has been taking Aleve once or twice daily since a week ago due to pain.  She denies chest pain, headache, fever, dizziness, abdominal pain.  ED course In the emergency department, she was tachypneic, tachycardic, BP was 167/106, other vital signs are within normal range.  Workup in ED showed normocytic anemia and normal BMP except for blood glucose of 112.  Magnesium 2.5, TSH 1.46, FOBT positive, urinalysis was suggestive of UTI, troponin x 2 was undetectable. Chest x-ray showed no acute cardiopulmonary abnormality.  Mild cardiomegaly.  Hiatal hernia Initial EKG personally reviewed showed atrial fibrillation with RVR.  Subsequent EKG personally reviewed showed atrial fibrillation with rate controlled. IV ceftriaxone  due to presumed UTI was given.   Gastroenterologist (Dr. Cinderella) was consulted and will see patient in the morning. [facility] was asked to admit patient.   Review of Systems: As mentioned in the history of present illness. All other systems reviewed and are negative. Past Medical History:  Diagnosis Date   Hypertension    Past Surgical History:  Procedure Laterality Date    BREAST SURGERY     lumpectomy   CYSTOSCOPY WITH STENT PLACEMENT Left 03/09/2017   Procedure: CYSTOSCOPY, RETROGRADE PYELOGRAM WITH STENT PLACEMENT;  Surgeon: Gaston Hamilton, MD;  Location: [facility];  Service: Urology;  Laterality: Left;   Social History:  reports that she has never smoked. She has never used smokeless tobacco. She reports that she does not drink alcohol and does not use drugs.  No Known Allergies  Family History  Problem Relation Age of Onset   Kidney cancer Neg Hx     Prior to Admission medications   Medication Sig Start Date End Date Taking? Authorizing Provider  lisinopril (PRINIVIL,ZESTRIL) 20 MG tablet Take 20 mg by mouth daily.    [provider]    Physical Exam: Vitals:   03/29/24 1930 03/29/24 1945 03/29/24 2000 03/29/24 2015  BP: (!) 156/93 (!) 173/108 (!) 171/98 (!) 171/91  Pulse: 85 86 77 81  Resp: 16 18 (!) 23 (!) 23  Temp:      TempSrc:      SpO2: 98% 100% 100% 97%  Weight:      Height:       General: Elderly female. Awake and alert and oriented x3. Not in any acute distress.  HEENT: NCAT.  PERRLA. EOMI. Sclerae anicteric.  Moist mucosal membranes. Neck: Neck supple without lymphadenopathy. No carotid bruits. No masses palpated.  Cardiovascular: Irregular rate and rhythm. No murmurs, rubs or gallops auscultated. No JVD.  Respiratory: Clear breath sounds.  No accessory muscle use. Abdomen: Soft, nontender, nondistended. Active bowel sounds. No masses or hepatosplenomegaly  Skin: No rashes,  lesions, or ulcerations.  Dry, warm to touch. Musculoskeletal:  2+ dorsalis pedis and radial pulses. Good ROM.  No contractures  Psychiatric: Intact judgment and insight.  Mood appropriate to current condition. Neurologic: No focal neurological deficits. Strength is 5/5 x 4.  CN II - XII grossly intact.  Assessment and Plan: GI bleed ABLA H/H= 8.9/28.9, this was 11.2/35.6 on 12/02/2022 Hemoccult was positive Continue IV Protonix 40 mg twice  daily No indication for blood transfusion at this time. Gastroenterologist (Dr. Cinderella) was consulted and will see patient in the morning  Acute onset of atrial fibrillation Continue metoprolol 12.5 mg p.o. twice daily Anticoagulant will be held at this time due to GI bleed  Essential hypertension Continue metoprolol, lisinopril  Obesity class I (BMI 32.92) Diet and lifestyle modification   Advance Care Planning: Full code  Consults: Gastroenterology  Family Communication: Daughter at bedside (all questions answered to satisfaction)  Severity of Illness: The appropriate patient status for this patient is OBSERVATION. Observation status is judged to be reasonable and necessary in order to provide the required intensity of service to ensure the patient's safety. The patient's presenting symptoms, physical exam findings, and initial radiographic and laboratory data in the context of their medical condition is felt to place them at decreased risk for further clinical deterioration. Furthermore, it is anticipated that the patient will be medically stable for discharge from the hospital within 2 midnights of admission.   Author: Rudell Ortman, DO 03/29/2024 10:39 PM  For on call review [email or web address].

## 2024-03-29 NOTE — ED Notes (Signed)
 Assumed care of pt, found her alert and oriented in bed w/ visitor bedside.  Pt had to urinate urgently, was placed on a bed pan.  No complaints at this time.

## 2024-03-29 NOTE — ED Notes (Signed)
 Found pt in bed alert and oriented w/ visitor bedside.  Pt states she feels much better than she did earlier. NO complaints of pain or weakness, states Im as calm as can be.  Warm blanket given.  Pt in good spirits.

## 2024-03-29 NOTE — ED Provider Notes (Signed)
 [facility]    CSN: [id number] Arrival date & time: 03/29/24  1709      History   Chief Complaint No chief complaint on file.   HPI Jody Hernandez is a 73 y.o. female.   Patient presenting today with daughter for evaluation of 1 to 2-week history of weakness, dyspnea on exertion.  She is a fairly poor historian but states anytime she has been up and moving she has needed to sit back down over the last week or so, worse the last few days.  Denies any chest pain, severe headache, visual change, nausea, vomiting, new medications or supplements recently.  She states her past medical history is significant only for hypertension and she takes lisinopril daily.  She does note she may have had an irregular heartbeat while she was pregnant but has not had any issues that she is aware of since.  So far not trying anything over-the-counter for symptoms.    Past Medical History:  Diagnosis Date   Hypertension     Patient Active Problem List   Diagnosis Date Noted   Sepsis (HCC) 03/09/2017   Ureteral stone with hydronephrosis    Hypertension 03/08/2017   Sepsis secondary to UTI (HCC) 03/08/2017   Urinary tract obstruction by kidney stone 03/08/2017    Past Surgical History:  Procedure Laterality Date   BREAST SURGERY     lumpectomy   CYSTOSCOPY WITH STENT PLACEMENT Left 03/09/2017   Procedure: CYSTOSCOPY, RETROGRADE PYELOGRAM WITH STENT PLACEMENT;  Surgeon: Gaston Hamilton, MD;  Location: [facility];  Service: Urology;  Laterality: Left;    OB History   No obstetric history on file.      Home Medications    Prior to Admission medications   Medication Sig Start Date End Date Taking? Authorizing Provider  lisinopril (PRINIVIL,ZESTRIL) 20 MG tablet Take 20 mg by mouth daily.    [provider]    Family History Family History  Problem Relation Age of Onset   Kidney cancer Neg Hx     Social History Social History   Tobacco Use   Smoking status:  Never   Smokeless tobacco: Never  Vaping Use   Vaping status: Never Used  Substance Use Topics   Alcohol use: No   Drug use: No     Allergies   Patient has no known allergies.   Review of Systems Review of Systems Per HPI  Physical Exam Triage Vital Signs ED Triage Vitals [03/29/24 1715]  Encounter Vitals Group     BP (!) 167/106     Girls Systolic BP Percentile      Girls Diastolic BP Percentile      Boys Systolic BP Percentile      Boys Diastolic BP Percentile      Pulse Rate (!) 102     Resp (!) 22     Temp 97.7 F (36.5 C)     Temp Source Oral     SpO2 98 %     Weight      Height      Head Circumference      Peak Flow      Pain Score      Pain Loc      Pain Education      Exclude from Growth Chart    No data found.  Updated Vital Signs BP (!) 167/106   Pulse (!) 102   Temp 97.7 F (36.5 C) (Oral)   Resp (!) 22   SpO2 98%  Visual Acuity Right Eye Distance:   Left Eye Distance:   Bilateral Distance:    Right Eye Near:   Left Eye Near:    Bilateral Near:     Physical Exam Vitals and nursing note reviewed.  Constitutional:      Appearance: Normal appearance. She is not ill-appearing.  HENT:     Head: Atraumatic.     Mouth/Throat:     Mouth: Mucous membranes are moist.  Eyes:     Extraocular Movements: Extraocular movements intact.     Conjunctiva/sclera: Conjunctivae normal.  Cardiovascular:     Rate and Rhythm: Tachycardia present. Rhythm irregular.  Pulmonary:     Effort: Pulmonary effort is normal.     Breath sounds: Normal breath sounds. No wheezing or rales.  Musculoskeletal:        General: Normal range of motion.     Cervical back: Normal range of motion and neck supple.  Skin:    General: Skin is warm and dry.  Neurological:     Mental Status: She is alert. Mental status is at baseline.  Psychiatric:     Comments: Appears mildly anxious      UC Treatments / Results  Labs (all labs ordered are listed, but only  abnormal results are displayed) Labs Reviewed - No data to display  EKG   Radiology No results found.  Procedures Procedures (including critical care time)  Medications Ordered in UC Medications - No data to display  Initial Impression / Assessment and Plan / UC Course  I have reviewed the triage vital signs and the nursing notes.  Pertinent labs & imaging results that were available during my care of the patient were reviewed by me and considered in my medical decision making (see chart for details).     Heart rate irregularly irregular, hypertensive and tachypneic additionally in triage.  EKG today showing atrial fibrillation with RVR at 119 bpm.  Discussed with patient that our recommendation is to go to the emergency department via EMS for further evaluation and management and she is agreeable to this.  Peripheral IV placed at bedside prior to EMS transport.  Final Clinical Impressions(s) / UC Diagnoses   Final diagnoses:  Atrial fibrillation with RVR (HCC)  SOB (shortness of breath)  Weakness   Discharge Instructions   None    ED Prescriptions   None    PDMP not reviewed this encounter.   Stuart Vernell Norris, PA-C 03/29/24 1739

## 2024-03-29 NOTE — ED Notes (Signed)
[facility] here to transport patient 

## 2024-03-29 NOTE — ED Triage Notes (Signed)
 Pt reports she has been SHOB and weak x 2 weeks reports   Denies any ned meds or chest pain

## 2024-03-30 ENCOUNTER — Observation Stay (HOSPITAL_COMMUNITY)

## 2024-03-30 ENCOUNTER — Other Ambulatory Visit (HOSPITAL_COMMUNITY): Payer: Self-pay | Admitting: *Deleted

## 2024-03-30 DIAGNOSIS — D649 Anemia, unspecified: Secondary | ICD-10-CM | POA: Diagnosis not present

## 2024-03-30 DIAGNOSIS — I4891 Unspecified atrial fibrillation: Secondary | ICD-10-CM

## 2024-03-30 DIAGNOSIS — I480 Paroxysmal atrial fibrillation: Secondary | ICD-10-CM | POA: Insufficient documentation

## 2024-03-30 DIAGNOSIS — K922 Gastrointestinal hemorrhage, unspecified: Secondary | ICD-10-CM | POA: Diagnosis not present

## 2024-03-30 DIAGNOSIS — R9431 Abnormal electrocardiogram [ECG] [EKG]: Secondary | ICD-10-CM

## 2024-03-30 DIAGNOSIS — K921 Melena: Secondary | ICD-10-CM | POA: Diagnosis not present

## 2024-03-30 DIAGNOSIS — D500 Iron deficiency anemia secondary to blood loss (chronic): Secondary | ICD-10-CM | POA: Diagnosis present

## 2024-03-30 LAB — COMPREHENSIVE METABOLIC PANEL WITH GFR
ALT: 24 U/L (ref 0–44)
AST: 18 U/L (ref 15–41)
Albumin: 3.7 g/dL (ref 3.5–5.0)
Alkaline Phosphatase: 82 U/L (ref 38–126)
Anion gap: 8 (ref 5–15)
BUN: 18 mg/dL (ref 8–23)
CO2: 26 mmol/L (ref 22–32)
Calcium: 8.5 mg/dL — ABNORMAL LOW (ref 8.9–10.3)
Chloride: 108 mmol/L (ref 98–111)
Creatinine, Ser: 0.77 mg/dL (ref 0.44–1.00)
GFR, Estimated: >60 mL/min (ref >60)
Glucose, Bld: 93 mg/dL (ref 70–99)
Potassium: 3.9 mmol/L (ref 3.5–5.1)
Sodium: 142 mmol/L (ref 135–145)
Total Bilirubin: 0.5 mg/dL (ref 0.0–1.2)
Total Protein: 5.7 g/dL — ABNORMAL LOW (ref 6.5–8.1)

## 2024-03-30 LAB — ECHOCARDIOGRAM COMPLETE
Area-P 1/2: 3.91 cm2
Height: 62 in
S' Lateral: 2.4 cm
Weight: 2880 [oz_av]

## 2024-03-30 LAB — CBC
HCT: 27 % — ABNORMAL LOW (ref 36.0–46.0)
Hemoglobin: 8.4 g/dL — ABNORMAL LOW (ref 12.0–15.0)
MCH: 28.4 pg (ref 26.0–34.0)
MCHC: 31.1 g/dL (ref 30.0–36.0)
MCV: 91.2 fL (ref 80.0–100.0)
Platelets: 258 K/uL (ref 150–400)
RBC: 2.96 MIL/uL — ABNORMAL LOW (ref 3.87–5.11)
RDW: 14.4 % (ref 11.5–15.5)
WBC: 7.1 K/uL (ref 4.0–10.5)
nRBC: 0 % (ref 0.0–0.2)

## 2024-03-30 LAB — MAGNESIUM: Magnesium: 2.4 mg/dL (ref 1.7–2.4)

## 2024-03-30 LAB — PHOSPHORUS: Phosphorus: 3.6 mg/dL (ref 2.5–4.6)

## 2024-03-30 MED ORDER — LISINOPRIL 10 MG PO TABS
20.0000 mg | ORAL_TABLET | Freq: Every day | ORAL | Status: DC
  Administered 2024-03-30 – 2024-04-02 (×4): 20 mg via ORAL
  Filled 2024-03-30 (×4): qty 2

## 2024-03-30 MED ORDER — POLYETHYLENE GLYCOL 3350 17 G PO PACK
17.0000 g | PACK | Freq: Two times a day (BID) | ORAL | Status: DC
  Administered 2024-03-30 – 2024-04-02 (×6): 17 g via ORAL
  Filled 2024-03-30 (×6): qty 1

## 2024-03-30 MED ORDER — ONDANSETRON HCL 4 MG/2ML IJ SOLN: 4.0000 mg | Freq: Four times a day (QID) | INTRAMUSCULAR | Status: DC | PRN

## 2024-03-30 MED ORDER — ACETAMINOPHEN 325 MG PO TABS: 650.0000 mg | ORAL_TABLET | Freq: Four times a day (QID) | ORAL | Status: DC | PRN

## 2024-03-30 MED ORDER — SODIUM CHLORIDE 0.9 % IV SOLN
1.0000 g | INTRAVENOUS | Status: DC
  Administered 2024-03-30 – 2024-03-31 (×2): 1 g via INTRAVENOUS
  Filled 2024-03-30 (×2): qty 10

## 2024-03-30 MED ORDER — ACETAMINOPHEN 650 MG RE SUPP: 650.0000 mg | Freq: Four times a day (QID) | RECTAL | Status: DC | PRN

## 2024-03-30 MED ORDER — PEG 3350-KCL-NA BICARB-NACL 420 G PO SOLR
4000.0000 mL | Freq: Once | ORAL | Status: AC
  Administered 2024-03-31: 4000 mL via ORAL

## 2024-03-30 MED ORDER — METOPROLOL TARTRATE 25 MG PO TABS
12.5000 mg | ORAL_TABLET | Freq: Two times a day (BID) | ORAL | Status: DC
  Administered 2024-03-30 – 2024-03-31 (×4): 12.5 mg via ORAL
  Filled 2024-03-30 (×4): qty 1

## 2024-03-30 MED ORDER — PANTOPRAZOLE SODIUM 40 MG IV SOLR
40.0000 mg | Freq: Two times a day (BID) | INTRAVENOUS | Status: DC
  Administered 2024-03-30 – 2024-04-02 (×8): 40 mg via INTRAVENOUS
  Filled 2024-03-30 (×8): qty 10

## 2024-03-30 MED ORDER — ONDANSETRON HCL 4 MG PO TABS
4.0000 mg | ORAL_TABLET | Freq: Four times a day (QID) | ORAL | Status: DC | PRN
Start: 2024-03-30 — End: 2024-04-02

## 2024-03-30 NOTE — Plan of Care (Signed)
  Problem: Education: Goal: Knowledge of General Education information will improve Description: Including pain rating scale, medication(s)/side effects and non-pharmacologic comfort measures Outcome: Progressing   Problem: Health Behavior/Discharge Planning: Goal: Ability to manage health-related needs will improve Outcome: Progressing   Problem: Clinical Measurements: Goal: Ability to maintain clinical measurements within normal limits will improve Outcome: Progressing   Problem: Clinical Measurements: Goal: Diagnostic test results will improve Outcome: Progressing   Problem: Coping: Goal: Level of anxiety will decrease Outcome: Progressing   Problem: Pain Managment: Goal: General experience of comfort will improve and/or be controlled Outcome: Progressing

## 2024-03-30 NOTE — Care Management Obs Status (Signed)
 MEDICARE OBSERVATION STATUS NOTIFICATION   Patient Details  Name: Jody Hernandez MRN: [id number] Date of Birth: 1950-07-16   Medicare Observation Status Notification Given:  Yes    Sharlyne Stabs, RN 03/30/2024, 1:38 PM

## 2024-03-30 NOTE — Consult Note (Signed)
 Jaia Alonge Faizan Camya Haydon, M.D. Gastroenterology & Hepatology                                           Patient Name: Jody Hernandez  MRN: [id number] Admission Date: 03/29/2024 Date of Evaluation:  03/30/2024 Time of Evaluation: 1:15 PM   Chief Complaint: Anemia positive FOBT, intermittent black-colored stools  HPI:  This is a 73 y.o. female with hypertension presented with worsening shortness of breath and fatigue .  Patient is admitted with new onset A-fib with RVR and UTI.  GI is consulted for symptomatic anemia, positive FOBT and black-colored stools  Patient seen and examined in the room.  Reports that she has been taking Aleve and Advil 2 times daily for many weeks.  She takes painkillers for lower extremity arthritis.  Reports usually her stools are brown but in past couple week they have been dark.  Patient never had upper endoscopy colonoscopy previously.  Currently patient is rate controlled.  Patient denies dysphagia, odynophagia abdominal pain and frank hematochezia  Chest x-ray showed no acute cardiopulmonary abnormality. Mild cardiomegaly. Hiatal hernia   Labs normal liver enzymes Baseline hemoglobin 11 presented with 8.4 MCV 91  Past Medical History: SEE CHRONIC ISSSUES: Past Medical History:  Diagnosis Date   Hypertension    Past Surgical History:  Past Surgical History:  Procedure Laterality Date   BREAST SURGERY     lumpectomy   CYSTOSCOPY WITH STENT PLACEMENT Left 03/09/2017   Procedure: CYSTOSCOPY, RETROGRADE PYELOGRAM WITH STENT PLACEMENT;  Surgeon: Gaston Hamilton, MD;  Location: [facility];  Service: Urology;  Laterality: Left;   Family History:  Family History  Problem Relation Age of Onset   Kidney cancer Neg Hx    Social History:  Social History   Tobacco Use   Smoking status: Never   Smokeless tobacco: Never  Vaping Use   Vaping status: Never Used  Substance Use Topics   Alcohol use: No   Drug use: No    Home Medications:  Prior to Admission  medications   Medication Sig Start Date End Date Taking? Authorizing Provider  lisinopril (ZESTRIL) 40 MG tablet Take 40 mg by mouth daily. 01/31/24   [provider]    Inpatient Medications:  Current Facility-Administered Medications:    acetaminophen  (TYLENOL ) tablet 650 mg, 650 mg, Oral, Q6H PRN **OR** acetaminophen  (TYLENOL ) suppository 650 mg, 650 mg, Rectal, Q6H PRN, Adefeso, Oladapo, DO   cefTRIAXone  (ROCEPHIN ) 1 g in sodium chloride  0.9 % 100 mL IVPB, 1 g, Intravenous, Q24H, Sigdel, Santosh, MD   Influenza vac split trivalent PF (FLUZONE HIGH-DOSE) injection 0.5 mL, 0.5 mL, Intramuscular, Tomorrow-1000, Adefeso, Oladapo, DO   lisinopril (ZESTRIL) tablet 20 mg, 20 mg, Oral, Daily, Adefeso, Oladapo, DO, 20 mg at 03/30/24 0954   metoprolol tartrate (LOPRESSOR) tablet 12.5 mg, 12.5 mg, Oral, BID, Adefeso, Oladapo, DO, 12.5 mg at 03/30/24 0954   ondansetron  (ZOFRAN ) tablet 4 mg, 4 mg, Oral, Q6H PRN **OR** ondansetron  (ZOFRAN ) injection 4 mg, 4 mg, Intravenous, Q6H PRN, Adefeso, Oladapo, DO   pantoprazole (PROTONIX) injection 40 mg, 40 mg, Intravenous, Q12H, Adefeso, Oladapo, DO, 40 mg at 03/30/24 0955   pneumococcal 20-valent conjugate vaccine (PREVNAR 20) injection 0.5 mL, 0.5 mL, Intramuscular, Tomorrow-1000, Adefeso, Oladapo, DO Allergies: Patient has no known allergies.  Complete Review of Systems: GENERAL: negative for malaise, night sweats HEENT: No changes in hearing or vision, no  nose bleeds or other nasal problems. NECK: Negative for lumps, goiter, pain and significant neck swelling RESPIRATORY: Negative for cough, wheezing CARDIOVASCULAR: Negative for chest pain, leg swelling, palpitations, orthopnea GI: SEE HPI MUSCULOSKELETAL: Negative for joint pain or swelling, back pain, and muscle pain. SKIN: Negative for lesions, rash PSYCH: Negative for sleep disturbance, mood disorder and recent psychosocial stressors. HEMATOLOGY Negative for prolonged bleeding, bruising  easily, and swollen nodes. ENDOCRINE: Negative for cold or heat intolerance, polyuria, polydipsia and goiter. NEURO: negative for tremor, gait imbalance, syncope and seizures. The remainder of the review of systems is noncontributory.  Physical Exam: BP 131/88 (BP Location: Left Arm)   Pulse 80   Temp 98.2 F (36.8 C) (Oral)   Resp 20   Ht 5' 2 (1.575 m)   Wt 81.6 kg   SpO2 98%   BMI 32.92 kg/m  GENERAL: The patient is AO x3, in no acute distress. HEENT: Head is normocephalic and atraumatic. EOMI are intact. Mouth is well hydrated and without lesions. NECK: Supple. No masses LUNGS: Clear to auscultation. No presence of rhonchi/wheezing/rales. Adequate chest expansion HEART: RRR, normal s1 and s2. ABDOMEN: Soft, nontender, no guarding, no peritoneal signs, and nondistended. BS +. No masses.   Laboratory Data CBC:     Component Value Date/Time   WBC 7.1 03/30/2024 0310   RBC 2.96 (L) 03/30/2024 0310   HGB 8.4 (L) 03/30/2024 0310   HCT 27.0 (L) 03/30/2024 0310   PLT 258 03/30/2024 0310   MCV 91.2 03/30/2024 0310   MCH 28.4 03/30/2024 0310   MCHC 31.1 03/30/2024 0310   RDW 14.4 03/30/2024 0310   LYMPHSABS 1.2 03/29/2024 1841   MONOABS 0.7 03/29/2024 1841   EOSABS 0.2 03/29/2024 1841   BASOSABS 0.1 03/29/2024 1841   COAG: No results found for: INR, PROTIME  BMP:     Latest Ref Rng & Units 03/30/2024    3:10 AM 03/29/2024    6:41 PM 12/02/2022   10:20 PM  BMP  Glucose 70 - 99 mg/dL 93  887  832   BUN 8 - 23 mg/dL 18  22  19    Creatinine 0.44 - 1.00 mg/dL 9.22  9.13  8.96   Sodium 135 - 145 mmol/L 142  138  138   Potassium 3.5 - 5.1 mmol/L 3.9  4.3  3.8   Chloride 98 - 111 mmol/L 108  103  104   CO2 22 - 32 mmol/L 26  25  26    Calcium 8.9 - 10.3 mg/dL 8.5  9.1  8.8     HEPATIC:     Latest Ref Rng & Units 03/30/2024    3:10 AM 03/29/2024    6:41 PM 03/09/2017    1:55 AM  Hepatic Function  Total Protein 6.5 - 8.1 g/dL 5.7  6.7  6.0   Albumin 3.5 - 5.0 g/dL  3.7  4.1  2.9   AST 15 - 41 U/L 18  26  26    ALT 0 - 44 U/L 24  29  16    Alk Phosphatase 38 - 126 U/L 82  98  59   Total Bilirubin 0.0 - 1.2 mg/dL 0.5  0.4  0.7     CARDIAC: No results found for: CKTOTAL, CKMB, CKMBINDEX, TROPONINI   Imaging: I personally reviewed and interpreted the available imaging.  Assessment & Plan:  This is a 73 y.o. female with hypertension presented with worsening shortness of breath and fatigue .  Patient is admitted with new onset A-fib  with RVR and UTI.  GI is consulted for symptomatic anemia, positive FOBT and dark -colored stools   #Positive FOBT  #Dark stools #Normocytic anemia  Patient has new onset A-fib likely in setting of UTI, echocardiogram was done and results pending  Baseline hemoglobin 11 dropped to 8, in setting of chronic NSAID use could be peptic ulcer disease  No previous endoscopy or colonoscopies need to rule out ulcer, polyp and malignancy  Patient has A-fib and would likely be placed on anticoagulation in setting of recent drop in hemoglobin and positive stool test will recommend endoscopic evaluation.  Patient had regular diet today  Recommendation  Obtain iron profile, ferritin vitamin B12 and folate levels PPI IV twice daily Obtain cardiology evaluation and clearance for upper endoscopy and colonoscopy Plan for bowel prep tomorrow and bidirectional endoscopy on Monday Clear liquid diet, bowel prep and n.p.o. past midnight on Sunday    Torrie Namba Faizan Mehtab Dolberry, MD Gastroenterology and Hepatology [facility]  This chart has been completed using [facility] Dictation software, and while attempts have been made to ensure accuracy , certain words and phrases may not be transcribed as intended

## 2024-03-30 NOTE — Progress Notes (Signed)
*  PRELIMINARY RESULTS* Echocardiogram 2D Echocardiogram has been performed.  Jody Hernandez 03/30/2024, 11:10 AM

## 2024-03-30 NOTE — H&P (View-Only) (Signed)
 Jaia Alonge Faizan Camya Haydon, M.D. Gastroenterology & Hepatology                                           Patient Name: Jody Hernandez  MRN: [id number] Admission Date: 03/29/2024 Date of Evaluation:  03/30/2024 Time of Evaluation: 1:15 PM   Chief Complaint: Anemia positive FOBT, intermittent black-colored stools  HPI:  This is a 73 y.o. female with hypertension presented with worsening shortness of breath and fatigue .  Patient is admitted with new onset A-fib with RVR and UTI.  GI is consulted for symptomatic anemia, positive FOBT and black-colored stools  Patient seen and examined in the room.  Reports that she has been taking Aleve and Advil 2 times daily for many weeks.  She takes painkillers for lower extremity arthritis.  Reports usually her stools are brown but in past couple week they have been dark.  Patient never had upper endoscopy colonoscopy previously.  Currently patient is rate controlled.  Patient denies dysphagia, odynophagia abdominal pain and frank hematochezia  Chest x-ray showed no acute cardiopulmonary abnormality. Mild cardiomegaly. Hiatal hernia   Labs normal liver enzymes Baseline hemoglobin 11 presented with 8.4 MCV 91  Past Medical History: SEE CHRONIC ISSSUES: Past Medical History:  Diagnosis Date   Hypertension    Past Surgical History:  Past Surgical History:  Procedure Laterality Date   BREAST SURGERY     lumpectomy   CYSTOSCOPY WITH STENT PLACEMENT Left 03/09/2017   Procedure: CYSTOSCOPY, RETROGRADE PYELOGRAM WITH STENT PLACEMENT;  Surgeon: Gaston Hamilton, MD;  Location: [facility];  Service: Urology;  Laterality: Left;   Family History:  Family History  Problem Relation Age of Onset   Kidney cancer Neg Hx    Social History:  Social History   Tobacco Use   Smoking status: Never   Smokeless tobacco: Never  Vaping Use   Vaping status: Never Used  Substance Use Topics   Alcohol use: No   Drug use: No    Home Medications:  Prior to Admission  medications   Medication Sig Start Date End Date Taking? Authorizing Provider  lisinopril (ZESTRIL) 40 MG tablet Take 40 mg by mouth daily. 01/31/24   [provider]    Inpatient Medications:  Current Facility-Administered Medications:    acetaminophen  (TYLENOL ) tablet 650 mg, 650 mg, Oral, Q6H PRN **OR** acetaminophen  (TYLENOL ) suppository 650 mg, 650 mg, Rectal, Q6H PRN, Adefeso, Oladapo, DO   cefTRIAXone  (ROCEPHIN ) 1 g in sodium chloride  0.9 % 100 mL IVPB, 1 g, Intravenous, Q24H, Sigdel, Santosh, MD   Influenza vac split trivalent PF (FLUZONE HIGH-DOSE) injection 0.5 mL, 0.5 mL, Intramuscular, Tomorrow-1000, Adefeso, Oladapo, DO   lisinopril (ZESTRIL) tablet 20 mg, 20 mg, Oral, Daily, Adefeso, Oladapo, DO, 20 mg at 03/30/24 0954   metoprolol tartrate (LOPRESSOR) tablet 12.5 mg, 12.5 mg, Oral, BID, Adefeso, Oladapo, DO, 12.5 mg at 03/30/24 0954   ondansetron  (ZOFRAN ) tablet 4 mg, 4 mg, Oral, Q6H PRN **OR** ondansetron  (ZOFRAN ) injection 4 mg, 4 mg, Intravenous, Q6H PRN, Adefeso, Oladapo, DO   pantoprazole (PROTONIX) injection 40 mg, 40 mg, Intravenous, Q12H, Adefeso, Oladapo, DO, 40 mg at 03/30/24 0955   pneumococcal 20-valent conjugate vaccine (PREVNAR 20) injection 0.5 mL, 0.5 mL, Intramuscular, Tomorrow-1000, Adefeso, Oladapo, DO Allergies: Patient has no known allergies.  Complete Review of Systems: GENERAL: negative for malaise, night sweats HEENT: No changes in hearing or vision, no  nose bleeds or other nasal problems. NECK: Negative for lumps, goiter, pain and significant neck swelling RESPIRATORY: Negative for cough, wheezing CARDIOVASCULAR: Negative for chest pain, leg swelling, palpitations, orthopnea GI: SEE HPI MUSCULOSKELETAL: Negative for joint pain or swelling, back pain, and muscle pain. SKIN: Negative for lesions, rash PSYCH: Negative for sleep disturbance, mood disorder and recent psychosocial stressors. HEMATOLOGY Negative for prolonged bleeding, bruising  easily, and swollen nodes. ENDOCRINE: Negative for cold or heat intolerance, polyuria, polydipsia and goiter. NEURO: negative for tremor, gait imbalance, syncope and seizures. The remainder of the review of systems is noncontributory.  Physical Exam: BP 131/88 (BP Location: Left Arm)   Pulse 80   Temp 98.2 F (36.8 C) (Oral)   Resp 20   Ht 5' 2 (1.575 m)   Wt 81.6 kg   SpO2 98%   BMI 32.92 kg/m  GENERAL: The patient is AO x3, in no acute distress. HEENT: Head is normocephalic and atraumatic. EOMI are intact. Mouth is well hydrated and without lesions. NECK: Supple. No masses LUNGS: Clear to auscultation. No presence of rhonchi/wheezing/rales. Adequate chest expansion HEART: RRR, normal s1 and s2. ABDOMEN: Soft, nontender, no guarding, no peritoneal signs, and nondistended. BS +. No masses.   Laboratory Data CBC:     Component Value Date/Time   WBC 7.1 03/30/2024 0310   RBC 2.96 (L) 03/30/2024 0310   HGB 8.4 (L) 03/30/2024 0310   HCT 27.0 (L) 03/30/2024 0310   PLT 258 03/30/2024 0310   MCV 91.2 03/30/2024 0310   MCH 28.4 03/30/2024 0310   MCHC 31.1 03/30/2024 0310   RDW 14.4 03/30/2024 0310   LYMPHSABS 1.2 03/29/2024 1841   MONOABS 0.7 03/29/2024 1841   EOSABS 0.2 03/29/2024 1841   BASOSABS 0.1 03/29/2024 1841   COAG: No results found for: INR, PROTIME  BMP:     Latest Ref Rng & Units 03/30/2024    3:10 AM 03/29/2024    6:41 PM 12/02/2022   10:20 PM  BMP  Glucose 70 - 99 mg/dL 93  887  832   BUN 8 - 23 mg/dL 18  22  19    Creatinine 0.44 - 1.00 mg/dL 9.22  9.13  8.96   Sodium 135 - 145 mmol/L 142  138  138   Potassium 3.5 - 5.1 mmol/L 3.9  4.3  3.8   Chloride 98 - 111 mmol/L 108  103  104   CO2 22 - 32 mmol/L 26  25  26    Calcium 8.9 - 10.3 mg/dL 8.5  9.1  8.8     HEPATIC:     Latest Ref Rng & Units 03/30/2024    3:10 AM 03/29/2024    6:41 PM 03/09/2017    1:55 AM  Hepatic Function  Total Protein 6.5 - 8.1 g/dL 5.7  6.7  6.0   Albumin 3.5 - 5.0 g/dL  3.7  4.1  2.9   AST 15 - 41 U/L 18  26  26    ALT 0 - 44 U/L 24  29  16    Alk Phosphatase 38 - 126 U/L 82  98  59   Total Bilirubin 0.0 - 1.2 mg/dL 0.5  0.4  0.7     CARDIAC: No results found for: CKTOTAL, CKMB, CKMBINDEX, TROPONINI   Imaging: I personally reviewed and interpreted the available imaging.  Assessment & Plan:  This is a 73 y.o. female with hypertension presented with worsening shortness of breath and fatigue .  Patient is admitted with new onset A-fib  with RVR and UTI.  GI is consulted for symptomatic anemia, positive FOBT and dark -colored stools   #Positive FOBT  #Dark stools #Normocytic anemia  Patient has new onset A-fib likely in setting of UTI, echocardiogram was done and results pending  Baseline hemoglobin 11 dropped to 8, in setting of chronic NSAID use could be peptic ulcer disease  No previous endoscopy or colonoscopies need to rule out ulcer, polyp and malignancy  Patient has A-fib and would likely be placed on anticoagulation in setting of recent drop in hemoglobin and positive stool test will recommend endoscopic evaluation.  Patient had regular diet today  Recommendation  Obtain iron profile, ferritin vitamin B12 and folate levels PPI IV twice daily Obtain cardiology evaluation and clearance for upper endoscopy and colonoscopy Plan for bowel prep tomorrow and bidirectional endoscopy on Monday Clear liquid diet, bowel prep and n.p.o. past midnight on Sunday    Torrie Namba Faizan Mehtab Dolberry, MD Gastroenterology and Hepatology [facility]  This chart has been completed using [facility] Dictation software, and while attempts have been made to ensure accuracy , certain words and phrases may not be transcribed as intended

## 2024-03-30 NOTE — Plan of Care (Signed)

## 2024-03-30 NOTE — Progress Notes (Addendum)
 PROGRESS NOTE    Jody Hernandez  FMW:[id number] DOB: Feb 06, 1951 DOA: 03/29/2024 PCP: Bertell Satterfield, MD   Brief Narrative:   : Jody Hernandez is a 73 y.o. female with medical history significant of hypertension who presents to the emergency department due to about 2 week onset of generalized weakness and shortness of breath on exertion which has progressively worsened over the past week and this was associated with palpitations with sensation of heart racing.  She went to an urgent care this morning where she was noted to have atrial fibrillation with RVR and EMS was activated.  Diltiazem 10 mg x 1 was given en route to the ED.  Patient states that she has been taking Aleve once or twice daily since a week ago due to pain.  She denies chest pain, headache, fever, dizziness, abdominal pain.   ED course In the emergency department, she was tachypneic, tachycardic, BP was 167/106, other vital signs are within normal range.  Workup in ED showed normocytic anemia and normal BMP except for blood glucose of 112.  Magnesium 2.5, TSH 1.46, FOBT positive, urinalysis was suggestive of UTI, troponin x 2 was undetectable. Chest x-ray showed no acute cardiopulmonary abnormality.  Mild cardiomegaly.  Hiatal hernia Initial EKG personally reviewed showed atrial fibrillation with RVR.  Subsequent EKG personally reviewed showed atrial fibrillation with rate controlled. IV ceftriaxone  due to presumed UTI was given.   Gastroenterologist (Dr. Cinderella) was consulted and will see patient in the morning.   TR[facility] was asked to admit patient.   Assessment & Plan:   Principal Problem:   GI bleed Active Problems:   Hypertension   Iron deficiency anemia due to chronic blood loss   Paroxysmal atrial fibrillation with RVR (HCC)    #Anemia, likely acute on chronic Hemoccult positive  -Patient reports NSAID use. -GI consulted, spoke with Dr. Cinderella, plan for EGD colonoscopy Monday.  - Will check iron panel   #Acute  onset of atrial fibrillation New onset A-fib RVR, rate currently controlled, continue metoprolol 12.5 mg p.o. twice daily Will need anticoagulation post procedure.   Echo shows normal left ventricular function, no wall motion abnormalities, severe dilated left and right atrium  TSH is normal 1.46.  Generalized weakness/shortness of breath likely secondary to combination of above.  essential hypertension Continue metoprolol, lisinopril   Obesity class I (BMI 32.92) Diet and lifestyle modification    Advance Care Planning: Full code   Consults: Gastroenterology   Family Communication: Daughter at bedside (all questions answered to satisfaction)   Severity of Illness: The appropriate patient status for this patient is OBSERVATION. Observation status is judged to be reasonable and necessary in order to provide the required intensity of service to ensure the patient's safety. The patient's presenting symptoms, physical exam findings, and initial radiographic and laboratory data in the context of their medical condition is felt to place them at decreased risk for further clinical deterioration. Furthermore, it is anticipated that the patient will be medically stable for discharge from the hospital within 2 midnights of admission.    Subjective:  Patient seen and examined at the bedside.  Denies shortness of breath.  Denies weakness today.  No bowel movement since admission.  Vital signs are stable.  Heart rate 80/min, irregular  Objective: Vitals:   03/30/24 0123 03/30/24 0515 03/30/24 0957 03/30/24 1302  BP:  (!) 128/92 (!) 166/103 131/88  Pulse:  77 82 80  Resp:  17 18 20   Temp:  98.1 F (36.7 C)  98.2 F (36.8 C)  TempSrc:  Oral  Oral  SpO2: 97% 98%  98%  Weight:      Height:        Intake/Output Summary (Last 24 hours) at 03/30/2024 1415 Last data filed at 03/30/2024 0400 Gross per 24 hour  Intake 0 ml  Output --  Net 0 ml   Filed Weights   03/29/24 1822  Weight: 81.6  kg    Examination:  General exam: Appears calm and comfortable  Respiratory system: Bilateral decreased breath sounds at bases Cardiovascular system: S1 & S2 heard, Rate controlled Gastrointestinal system: Abdomen is nondistended, soft and nontender. Normal bowel sounds heard. Extremities: No cyanosis, clubbing, edema, chronic venous stasis changes Central nervous system: Alert and oriented. No focal neurological deficits. Moving extremities   Data Reviewed: I have personally reviewed following labs and imaging studies  CBC: Recent Labs  Lab 03/29/24 1841 03/30/24 0310  WBC 8.0 7.1  NEUTROABS 5.9  --   HGB 8.9* 8.4*  HCT 28.9* 27.0*  MCV 92.3 91.2  PLT 267 258   Basic Metabolic Panel: Recent Labs  Lab 03/29/24 1841 03/30/24 0310  NA 138 142  K 4.3 3.9  CL 103 108  CO2 25 26  GLUCOSE 112* 93  BUN 22 18  CREATININE 0.86 0.77  CALCIUM 9.1 8.5*  MG 2.5* 2.4  PHOS  --  3.6   GFR: Estimated Creatinine Clearance: 60.1 mL/min (by C-G formula based on SCr of 0.77 mg/dL). Liver Function Tests: Recent Labs  Lab 03/29/24 1841 03/30/24 0310  AST 26 18  ALT 29 24  ALKPHOS 98 82  BILITOT 0.4 0.5  PROT 6.7 5.7*  ALBUMIN 4.1 3.7   No results for input(s): LIPASE, AMYLASE in the last 168 hours. No results for input(s): AMMONIA in the last 168 hours. Coagulation Profile: No results for input(s): INR, PROTIME in the last 168 hours. Cardiac Enzymes: No results for input(s): CKTOTAL, CKMB, CKMBINDEX, TROPONINI in the last 168 hours. BNP (last 3 results) No results for input(s): PROBNP in the last 8760 hours. HbA1C: No results for input(s): HGBA1C in the last 72 hours. CBG: No results for input(s): GLUCAP in the last 168 hours. Lipid Profile: No results for input(s): CHOL, HDL, LDLCALC, TRIG, CHOLHDL, LDLDIRECT in the last 72 hours. Thyroid  Function Tests: Recent Labs    03/29/24 1841  TSH 1.460   Anemia Panel: No results for  input(s): VITAMINB12, FOLATE, FERRITIN, TIBC, IRON, RETICCTPCT in the last 72 hours. Sepsis Labs: No results for input(s): PROCALCITON, LATICACIDVEN in the last 168 hours.  No results found for this or any previous visit (from the past 240 hours).       Radiology Studies: ECHOCARDIOGRAM COMPLETE Result Date: 03/30/2024    ECHOCARDIOGRAM REPORT   Patient Name:   Jody Hernandez Ramseur Date of Exam: 03/30/2024 Medical Rec #:  [id number]     Height:       62.0 in Accession #:    [id number]    Weight:       180.0 lb Date of Birth:  [birth year]      BSA:          1.828 m Patient Age:    73 years      BP:           166/103 mmHg Patient Gender: F             HR:           82 bpm. Exam Location:  [facility] Procedure: 2D Echo, Cardiac Doppler and Color Doppler (Both Spectral and Color            Flow Doppler were utilized during procedure). Indications:    Abnormal ECG R94.31                 Atrial Fibrillation I48.91  History:        Patient has no prior history of Echocardiogram examinations.                 Arrythmias:Atrial Fibrillation; Risk Factors:Hypertension.  Sonographer:    Aida Pizza RCS Referring Phys: [id number] Maryjayne Kleven IMPRESSIONS  1. Left ventricular ejection fraction, by estimation, is 60 to 65%. The left ventricle has normal function. The left ventricle has no regional wall motion abnormalities. There is mild left ventricular hypertrophy of the septal segment. Left ventricular diastolic function could not be evaluated.  2. Right ventricular systolic function is normal. The right ventricular size is mildly enlarged. There is normal pulmonary artery systolic pressure.  3. Left atrial size was severely dilated.  4. Right atrial size was severely dilated.  5. A small pericardial effusion is present. The pericardial effusion is posterior to the left ventricle.  6. The mitral valve is normal in structure. Mild mitral valve regurgitation. No evidence of mitral stenosis.  7. The tricuspid  valve is abnormal. Tricuspid valve regurgitation is moderate.  8. The aortic valve is tricuspid. Aortic valve regurgitation is not visualized. No aortic stenosis is present.  9. The inferior vena cava is dilated in size with >50% respiratory variability, suggesting right atrial pressure of 8 mmHg. Comparison(s): No prior Echocardiogram. FINDINGS  Left Ventricle: Left ventricular ejection fraction, by estimation, is 60 to 65%. The left ventricle has normal function. The left ventricle has no regional wall motion abnormalities. Strain was performed and the global longitudinal strain is indeterminate. The left ventricular internal cavity size was normal in size. There is mild left ventricular hypertrophy of the septal segment. Left ventricular diastolic function could not be evaluated due to atrial fibrillation. Left ventricular diastolic function could not be evaluated. Right Ventricle: The right ventricular size is mildly enlarged. No increase in right ventricular wall thickness. Right ventricular systolic function is normal. There is normal pulmonary artery systolic pressure. The tricuspid regurgitant velocity is 2.52  m/s, and with an assumed right atrial pressure of 8 mmHg, the estimated right ventricular systolic pressure is 33.4 mmHg. Left Atrium: Left atrial size was severely dilated. Right Atrium: Right atrial size was severely dilated. Pericardium: A small pericardial effusion is present. The pericardial effusion is posterior to the left ventricle. Mitral Valve: The mitral valve is normal in structure. Mild mitral valve regurgitation. No evidence of mitral valve stenosis. Tricuspid Valve: The tricuspid valve is abnormal. Tricuspid valve regurgitation is moderate . No evidence of tricuspid stenosis. Aortic Valve: The aortic valve is tricuspid. Aortic valve regurgitation is not visualized. No aortic stenosis is present. Pulmonic Valve: The pulmonic valve was normal in structure. Pulmonic valve regurgitation is  trivial. No evidence of pulmonic stenosis. Aorta: The aortic root is normal in size and structure. Venous: The inferior vena cava is dilated in size with greater than 50% respiratory variability, suggesting right atrial pressure of 8 mmHg. IAS/Shunts: No atrial level shunt detected by color flow Doppler. Additional Comments: 3D was performed not requiring image post processing on an independent workstation and was indeterminate.  LEFT VENTRICLE PLAX 2D LVIDd:         4.30 cm LVIDs:  2.40 cm LV PW:         0.90 cm LV IVS:        1.30 cm LVOT diam:     1.70 cm LV SV:         43 LV SV Index:   24 LVOT Area:     2.27 cm  RIGHT VENTRICLE TAPSE (M-mode): 1.6 cm LEFT ATRIUM             Index        RIGHT ATRIUM           Index LA diam:        4.40 cm 2.41 cm/m   RA Area:     23.00 cm LA Vol (A2C):   83.4 ml 45.62 ml/m  RA Volume:   71.10 ml  38.90 ml/m LA Vol (A4C):   93.7 ml 51.26 ml/m LA Biplane Vol: 88.2 ml 48.25 ml/m  AORTIC VALVE LVOT Vmax:   88.40 cm/s LVOT Vmean:  67.100 cm/s LVOT VTI:    0.190 m  AORTA Ao Root diam: 3.10 cm MITRAL VALVE                TRICUSPID VALVE MV Area (PHT): 3.91 cm     TR Peak grad:   25.4 mmHg MV Decel Time: 194 msec     TR Vmax:        252.00 cm/s MV E velocity: 140.00 cm/s                             SHUNTS                             Systemic VTI:  0.19 m                             Systemic Diam: 1.70 cm Vishnu Priya Mallipeddi Electronically signed by Diannah Late Mallipeddi Signature Date/Time: 03/30/2024/2:08:16 PM    Final    DG Chest Port 1 View Result Date: 03/29/2024 EXAM: 1 VIEW(S) XRAY OF THE CHEST 03/29/2024 07:24:20 PM COMPARISON: 03/15/2007 CLINICAL HISTORY: dyspnea dyspnea FINDINGS: LUNGS AND PLEURA: No focal pulmonary opacity. No pulmonary edema. No pleural effusion. No pneumothorax. HEART AND MEDIASTINUM: Mild cardiomegaly. Large  hiatal hernia. BONES AND SOFT TISSUES: No acute osseous abnormality. IMPRESSION: 1. No acute cardiopulmonary abnormality. 2.  Mild cardiomegaly. 3. Hiatal hernia. Electronically signed by: Kevin Dover MD 03/29/2024 07:27 PM EDT RP Workstation: [id number]        Scheduled Meds:  Influenza vac split trivalent PF  0.5 mL Intramuscular Tomorrow-1000   lisinopril  20 mg Oral Daily   metoprolol tartrate  12.5 mg Oral BID   pantoprazole (PROTONIX) IV  40 mg Intravenous Q12H   pneumococcal 20-valent conjugate vaccine  0.5 mL Intramuscular Tomorrow-1000   Continuous Infusions:  cefTRIAXone  (ROCEPHIN )  IV            Delynn Olvera, MD [facility] 03/30/2024, 2:15 PM

## 2024-03-31 DIAGNOSIS — K922 Gastrointestinal hemorrhage, unspecified: Secondary | ICD-10-CM | POA: Diagnosis not present

## 2024-03-31 LAB — IRON AND TIBC
Iron: 22 ug/dL — ABNORMAL LOW (ref 28–170)
Saturation Ratios: 5 % — ABNORMAL LOW (ref 10.4–31.8)
TIBC: 406 ug/dL (ref 250–450)
UIBC: 384 ug/dL

## 2024-03-31 LAB — HEMOGLOBIN AND HEMATOCRIT, BLOOD
HCT: 29 % — ABNORMAL LOW (ref 36.0–46.0)
Hemoglobin: 8.8 g/dL — ABNORMAL LOW (ref 12.0–15.0)

## 2024-03-31 LAB — FERRITIN: Ferritin: 16 ng/mL (ref 11–307)

## 2024-03-31 NOTE — Progress Notes (Signed)
 Patient has had 3 large loose bowel movements since starting the bowel prep. Staff has assisted in cleaning the patient, the room and changing rooms in order to deep clean the first room. Encouraged patient to slightly slow down on the prep, she has consumed half at this time. Received the prep around 1420 per orders. CN aware and approved the room change. Tele contacted for room change also. Patients daughter at the bedside. Will continue to monitor.

## 2024-03-31 NOTE — Progress Notes (Addendum)
 PROGRESS NOTE    Jody Hernandez  FMW:[id number] DOB: [birth year] DOA: 03/29/2024 PCP: Bertell Satterfield, MD   Brief Narrative:   : Jody Hernandez is a 73 y.o. female with medical history significant of hypertension who presents to the emergency department due to about 2 week onset of generalized weakness and shortness of breath on exertion which has progressively worsened over the past week and this was associated with palpitations with sensation of heart racing.  She went to an urgent care this morning where she was noted to have atrial fibrillation with RVR and EMS was activated.  Diltiazem 10 mg x 1 was given en route to the ED.  Patient states that she has been taking Aleve once or twice daily since a week ago due to pain.  She denies chest pain, headache, fever, dizziness, abdominal pain.   ED course In the emergency department, she was tachypneic, tachycardic, BP was 167/106, other vital signs are within normal range.  Workup in ED showed normocytic anemia and normal BMP except for blood glucose of 112.  Magnesium 2.5, TSH 1.46, FOBT positive, urinalysis was suggestive of UTI, troponin x 2 was undetectable. Chest x-ray showed no acute cardiopulmonary abnormality.  Mild cardiomegaly.  Hiatal hernia Initial EKG personally reviewed showed atrial fibrillation with RVR.  Subsequent EKG personally reviewed showed atrial fibrillation with rate controlled. IV ceftriaxone  due to presumed UTI was given.   GI following, plan for EGD, colonoscopy 11/3  [facility] was asked to admit patient.   Assessment & Plan:   Principal Problem:   GI bleed Active Problems:   Hypertension   Iron deficiency anemia due to chronic blood loss   Paroxysmal atrial fibrillation with RVR (HCC)   Atrial fibrillation (HCC)   Anemia   Melena    #Anemia, likely acute on chronic Hemoccult positive  -Patient reports NSAID use. - GI following, plan for EGD colonoscopy Monday.  - Will check iron panel   #Acute onset of  atrial fibrillation New onset A-fib RVR, rate currently controlled,  Telemetry shows continued A-fib, c increase metoprolol to 25 mg twice daily  Will need anticoagulation post procedure.   Echo shows normal left ventricular function, no wall motion abnormalities, severe dilated left and right atrium  TSH is normal 1.46. Cardiology consult prior to procedure per GI request  UTI: Continue Rocephin , follow-up on cultures  Generalized weakness/shortness of breath likely secondary to combination of above.  essential hypertension Continue metoprolol, lisinopril   Obesity class I (BMI 32.92) Diet and lifestyle modification    Advance Care Planning: Full code   Consults: Gastroenterology   Family Communication: family at bedside (all questions answered to satisfaction)   Severity of Illness: The appropriate patient status for this patient is OBSERVATION. Observation status is judged to be reasonable and necessary in order to provide the required intensity of service to ensure the patient's safety. The patient's presenting symptoms, physical exam findings, and initial radiographic and laboratory data in the context of their medical condition is felt to place them at decreased risk for further clinical deterioration. Furthermore, it is anticipated that the patient will be medically stable for discharge from the hospital within 2 midnights of admission.    Subjective:  Patient seen and examined at the bedside.  Denies shortness of breath.  Denied any bowel movements.  Vital signs are stable.  Objective: Vitals:   03/30/24 0957 03/30/24 1302 03/30/24 2017 03/31/24 1246  BP: (!) 166/103 131/88 (!) 168/99 (!) 166/95  Pulse: 82 80  73 82  Resp: 18 20 18 20   Temp:  98.2 F (36.8 C) 98.1 F (36.7 C) 98.2 F (36.8 C)  TempSrc:  Oral Oral Oral  SpO2:  98% 96% 96%  Weight:      Height:        Intake/Output Summary (Last 24 hours) at 03/31/2024 1336 Last data filed at 03/30/2024 1632 Gross  per 24 hour  Intake --  Output 200 ml  Net -200 ml   Filed Weights   03/29/24 1822  Weight: 81.6 kg    Examination:  General exam: Appears calm and comfortable  Respiratory system: Bilateral decreased breath sounds at bases Cardiovascular system: S1 & S2 heard, Rate controlled Gastrointestinal system: Abdomen is nondistended, soft and nontender. Normal bowel sounds heard. Extremities: No cyanosis, clubbing, edema, chronic venous stasis changes Central nervous system: Alert and oriented. No focal neurological deficits. Moving extremities   Data Reviewed: I have personally reviewed following labs and imaging studies  CBC: Recent Labs  Lab 03/29/24 1841 03/30/24 0310 03/31/24 0503  WBC 8.0 7.1  --   NEUTROABS 5.9  --   --   HGB 8.9* 8.4* 8.8*  HCT 28.9* 27.0* 29.0*  MCV 92.3 91.2  --   PLT 267 258  --    Basic Metabolic Panel: Recent Labs  Lab 03/29/24 1841 03/30/24 0310  NA 138 142  K 4.3 3.9  CL 103 108  CO2 25 26  GLUCOSE 112* 93  BUN 22 18  CREATININE 0.86 0.77  CALCIUM 9.1 8.5*  MG 2.5* 2.4  PHOS  --  3.6   GFR: Estimated Creatinine Clearance: 60.1 mL/min (by C-G formula based on SCr of 0.77 mg/dL). Liver Function Tests: Recent Labs  Lab 03/29/24 1841 03/30/24 0310  AST 26 18  ALT 29 24  ALKPHOS 98 82  BILITOT 0.4 0.5  PROT 6.7 5.7*  ALBUMIN 4.1 3.7   No results for input(s): LIPASE, AMYLASE in the last 168 hours. No results for input(s): AMMONIA in the last 168 hours. Coagulation Profile: No results for input(s): INR, PROTIME in the last 168 hours. Cardiac Enzymes: No results for input(s): CKTOTAL, CKMB, CKMBINDEX, TROPONINI in the last 168 hours. BNP (last 3 results) No results for input(s): PROBNP in the last 8760 hours. HbA1C: No results for input(s): HGBA1C in the last 72 hours. CBG: No results for input(s): GLUCAP in the last 168 hours. Lipid Profile: No results for input(s): CHOL, HDL, LDLCALC, TRIG,  CHOLHDL, LDLDIRECT in the last 72 hours. Thyroid  Function Tests: Recent Labs    03/29/24 1841  TSH 1.460   Anemia Panel: Recent Labs    03/31/24 0503  FERRITIN 16  TIBC 406  IRON 22*   Sepsis Labs: No results for input(s): PROCALCITON, LATICACIDVEN in the last 168 hours.  Recent Results (from the past 240 hours)  Urine Culture     Status: Abnormal (Preliminary result)   Collection Time: 03/29/24  7:58 PM   Specimen: Urine, Clean Catch  Result Value Ref Range Status   Specimen Description   Final    URINE, CLEAN CATCH Performed at [facility], [address], [place], [place] [address]    Special Requests   Final    NONE Performed at [facility] [address], [place], [place] [address]    Culture (A)  Final    >=100,000 COLONIES/mL ESCHERICHIA COLI SUSCEPTIBILITIES TO FOLLOW Performed at [facility], [address], [place], [place] [address]    Report Status PENDING  Incomplete         Radiology Studies: ECHOCARDIOGRAM COMPLETE Result Date: 03/30/2024    ECHOCARDIOGRAM REPORT   Patient Name:   KATRINE RADICH Crewe Date of Exam: 03/30/2024 Medical Rec #:  [id number]     Height:       62.0 in Accession #:    [id number]    Weight:       180.0 lb Date of Birth:  [birth year]      BSA:          1.828 m Patient Age:    73 years      BP:           166/103 mmHg Patient Gender: F             HR:           82 bpm. Exam Location:  [facility] Procedure: 2D Echo, Cardiac Doppler and Color Doppler (Both Spectral and Color            Flow Doppler were utilized during procedure). Indications:    Abnormal ECG R94.31                 Atrial Fibrillation I48.91  History:        Patient has no prior history of Echocardiogram examinations.                 Arrythmias:Atrial Fibrillation; Risk Factors:Hypertension.  Sonographer:    Aida Pizza RCS Referring Phys: [id number] Taylyn Brame IMPRESSIONS  1. Left ventricular ejection fraction, by estimation, is 60 to 65%. The left ventricle has  normal function. The left ventricle has no regional wall motion abnormalities. There is mild left ventricular hypertrophy of the septal segment. Left ventricular diastolic function could not be evaluated.  2. Right ventricular systolic function is normal. The right ventricular size is mildly enlarged. There is normal pulmonary artery systolic pressure.  3. Left atrial size was severely dilated.  4. Right atrial size was severely dilated.  5. A small pericardial effusion is present. The pericardial effusion is posterior to the left ventricle.  6. The mitral valve is normal in structure. Mild mitral valve regurgitation. No evidence of mitral stenosis.  7. The tricuspid valve is abnormal. Tricuspid valve regurgitation is moderate.  8. The aortic valve is tricuspid. Aortic valve regurgitation is not visualized. No aortic stenosis is present.  9. The inferior vena cava is dilated in size with >50% respiratory variability, suggesting right atrial pressure of 8 mmHg. Comparison(s): No prior Echocardiogram. FINDINGS  Left Ventricle: Left ventricular ejection fraction, by estimation, is 60 to 65%. The left ventricle has normal function. The left ventricle has no regional wall motion abnormalities. Strain was performed and the global longitudinal strain is indeterminate. The left ventricular internal cavity size was normal in size. There is mild left ventricular hypertrophy of the septal segment. Left ventricular diastolic function could not be evaluated due to atrial fibrillation. Left ventricular diastolic function could not be evaluated. Right Ventricle: The right ventricular size is mildly enlarged. No increase in right ventricular wall thickness. Right ventricular systolic function is normal. There is normal pulmonary artery systolic pressure. The tricuspid regurgitant velocity is 2.52  m/s, and with an assumed right atrial pressure of 8 mmHg, the estimated right ventricular systolic pressure is 33.4 mmHg. Left Atrium:  Left atrial size was severely dilated. Right Atrium: Right atrial size was severely dilated. Pericardium: A small pericardial effusion is present. The pericardial effusion is posterior to the left ventricle. Mitral Valve:  The mitral valve is normal in structure. Mild mitral valve regurgitation. No evidence of mitral valve stenosis. Tricuspid Valve: The tricuspid valve is abnormal. Tricuspid valve regurgitation is moderate . No evidence of tricuspid stenosis. Aortic Valve: The aortic valve is tricuspid. Aortic valve regurgitation is not visualized. No aortic stenosis is present. Pulmonic Valve: The pulmonic valve was normal in structure. Pulmonic valve regurgitation is trivial. No evidence of pulmonic stenosis. Aorta: The aortic root is normal in size and structure. Venous: The inferior vena cava is dilated in size with greater than 50% respiratory variability, suggesting right atrial pressure of 8 mmHg. IAS/Shunts: No atrial level shunt detected by color flow Doppler. Additional Comments: 3D was performed not requiring image post processing on an independent workstation and was indeterminate.  LEFT VENTRICLE PLAX 2D LVIDd:         4.30 cm LVIDs:         2.40 cm LV PW:         0.90 cm LV IVS:        1.30 cm LVOT diam:     1.70 cm LV SV:         43 LV SV Index:   24 LVOT Area:     2.27 cm  RIGHT VENTRICLE TAPSE (M-mode): 1.6 cm LEFT ATRIUM             Index        RIGHT ATRIUM           Index LA diam:        4.40 cm 2.41 cm/m   RA Area:     23.00 cm LA Vol (A2C):   83.4 ml 45.62 ml/m  RA Volume:   71.10 ml  38.90 ml/m LA Vol (A4C):   93.7 ml 51.26 ml/m LA Biplane Vol: 88.2 ml 48.25 ml/m  AORTIC VALVE LVOT Vmax:   88.40 cm/s LVOT Vmean:  67.100 cm/s LVOT VTI:    0.190 m  AORTA Ao Root diam: 3.10 cm MITRAL VALVE                TRICUSPID VALVE MV Area (PHT): 3.91 cm     TR Peak grad:   25.4 mmHg MV Decel Time: 194 msec     TR Vmax:        252.00 cm/s MV E velocity: 140.00 cm/s                             SHUNTS                              Systemic VTI:  0.19 m                             Systemic Diam: 1.70 cm Vishnu Priya Mallipeddi Electronically signed by Diannah Late Mallipeddi Signature Date/Time: 03/30/2024/2:08:16 PM    Final    DG Chest Port 1 View Result Date: 03/29/2024 EXAM: 1 VIEW(S) XRAY OF THE CHEST 03/29/2024 07:24:20 PM COMPARISON: 03/15/2007 CLINICAL HISTORY: dyspnea dyspnea FINDINGS: LUNGS AND PLEURA: No focal pulmonary opacity. No pulmonary edema. No pleural effusion. No pneumothorax. HEART AND MEDIASTINUM: Mild cardiomegaly. Large  hiatal hernia. BONES AND SOFT TISSUES: No acute osseous abnormality. IMPRESSION: 1. No acute cardiopulmonary abnormality. 2. Mild cardiomegaly. 3. Hiatal hernia. Electronically signed by: Franky Crease MD 03/29/2024 07:27 PM EDT RP Workstation: [id number]  Scheduled Meds:  Influenza vac split trivalent PF  0.5 mL Intramuscular Tomorrow-1000   lisinopril  20 mg Oral Daily   metoprolol tartrate  12.5 mg Oral BID   pantoprazole (PROTONIX) IV  40 mg Intravenous Q12H   pneumococcal 20-valent conjugate vaccine  0.5 mL Intramuscular Tomorrow-1000   polyethylene glycol  17 g Oral BID   polyethylene glycol-electrolytes  4,000 mL Oral Once   Continuous Infusions:  cefTRIAXone  (ROCEPHIN )  IV Stopped (03/30/24 1842)          Derryl Duval, MD [facility] 03/31/2024, 1:36 PM

## 2024-03-31 NOTE — Plan of Care (Signed)

## 2024-03-31 NOTE — TOC CM/SW Note (Signed)
 Transition of Care [facility]) - Inpatient Brief Assessment   Patient Details  Name: Jody Hernandez MRN: [id number] Date of Birth: 1950/11/27  [facility] CM/SW Contact:    Noreen KATHEE Pinal, LCSWA Phone Number: 03/31/2024, 10:58 AM   Clinical Narrative:  Inpatient Care Management (ICM) has reviewed patient and no other ICM needs have been identified at this time. We will continue to monitor patient advancement through interdisciplinary progression rounds. If new patient transition needs arise, please place a ICM consult.  Transition of Care Asessment: Insurance and Status: Insurance coverage has been reviewed Patient has primary care physician: Yes Home environment has been reviewed: Single Family Home Prior level of function:: Independent Prior/Current Home Services: No current home services Social Drivers of Health Review: SDOH reviewed no interventions necessary Readmission risk has been reviewed: Yes Transition of care needs: no transition of care needs at this time

## 2024-04-01 ENCOUNTER — Encounter (HOSPITAL_COMMUNITY): Payer: Self-pay | Admitting: Internal Medicine

## 2024-04-01 ENCOUNTER — Encounter (HOSPITAL_COMMUNITY)
Admission: EM | Disposition: A | Discharge status: Home / Self Care | Payer: Self-pay | Source: Ambulatory Visit | Attending: Emergency Medicine

## 2024-04-01 ENCOUNTER — Observation Stay (HOSPITAL_COMMUNITY): Admitting: Anesthesiology

## 2024-04-01 DIAGNOSIS — K254 Chronic or unspecified gastric ulcer with hemorrhage: Secondary | ICD-10-CM | POA: Diagnosis not present

## 2024-04-01 DIAGNOSIS — D509 Iron deficiency anemia, unspecified: Secondary | ICD-10-CM

## 2024-04-01 DIAGNOSIS — D500 Iron deficiency anemia secondary to blood loss (chronic): Secondary | ICD-10-CM | POA: Diagnosis not present

## 2024-04-01 DIAGNOSIS — R195 Other fecal abnormalities: Secondary | ICD-10-CM | POA: Diagnosis not present

## 2024-04-01 DIAGNOSIS — K259 Gastric ulcer, unspecified as acute or chronic, without hemorrhage or perforation: Secondary | ICD-10-CM | POA: Diagnosis not present

## 2024-04-01 DIAGNOSIS — K317 Polyp of stomach and duodenum: Secondary | ICD-10-CM

## 2024-04-01 DIAGNOSIS — Q438 Other specified congenital malformations of intestine: Secondary | ICD-10-CM | POA: Diagnosis not present

## 2024-04-01 DIAGNOSIS — K449 Diaphragmatic hernia without obstruction or gangrene: Secondary | ICD-10-CM

## 2024-04-01 DIAGNOSIS — K648 Other hemorrhoids: Secondary | ICD-10-CM

## 2024-04-01 DIAGNOSIS — K3189 Other diseases of stomach and duodenum: Secondary | ICD-10-CM | POA: Diagnosis not present

## 2024-04-01 DIAGNOSIS — K922 Gastrointestinal hemorrhage, unspecified: Secondary | ICD-10-CM | POA: Diagnosis not present

## 2024-04-01 DIAGNOSIS — I4891 Unspecified atrial fibrillation: Secondary | ICD-10-CM | POA: Diagnosis not present

## 2024-04-01 DIAGNOSIS — K2950 Unspecified chronic gastritis without bleeding: Secondary | ICD-10-CM | POA: Diagnosis not present

## 2024-04-01 DIAGNOSIS — I10 Essential (primary) hypertension: Secondary | ICD-10-CM

## 2024-04-01 HISTORY — PX: ESOPHAGOGASTRODUODENOSCOPY: SHX5428

## 2024-04-01 HISTORY — PX: COLONOSCOPY: SHX5424

## 2024-04-01 LAB — MAGNESIUM: Magnesium: 2.4 mg/dL (ref 1.7–2.4)

## 2024-04-01 LAB — CBC WITH DIFFERENTIAL/PLATELET
Abs Immature Granulocytes: 0.03 K/uL (ref 0.00–0.07)
Basophils Absolute: 0 K/uL (ref 0.0–0.1)
Basophils Relative: 1 %
Eosinophils Absolute: 0 K/uL (ref 0.0–0.5)
Eosinophils Relative: 1 %
HCT: 32.8 % — ABNORMAL LOW (ref 36.0–46.0)
Hemoglobin: 9.7 g/dL — ABNORMAL LOW (ref 12.0–15.0)
Immature Granulocytes: 0 %
Lymphocytes Relative: 11 %
Lymphs Abs: 0.7 K/uL (ref 0.7–4.0)
MCH: 27.6 pg (ref 26.0–34.0)
MCHC: 29.6 g/dL — ABNORMAL LOW (ref 30.0–36.0)
MCV: 93.4 fL (ref 80.0–100.0)
Monocytes Absolute: 0.5 K/uL (ref 0.1–1.0)
Monocytes Relative: 7 %
Neutro Abs: 5.4 K/uL (ref 1.7–7.7)
Neutrophils Relative %: 80 %
Platelets: 286 K/uL (ref 150–400)
RBC: 3.51 MIL/uL — ABNORMAL LOW (ref 3.87–5.11)
RDW: 14 % (ref 11.5–15.5)
WBC: 6.7 K/uL (ref 4.0–10.5)
nRBC: 0 % (ref 0.0–0.2)

## 2024-04-01 LAB — URINE CULTURE: Culture: >=100000 — AB

## 2024-04-01 LAB — BASIC METABOLIC PANEL WITH GFR
Anion gap: 12 (ref 5–15)
BUN: 13 mg/dL (ref 8–23)
CO2: 23 mmol/L (ref 22–32)
Calcium: 9 mg/dL (ref 8.9–10.3)
Chloride: 105 mmol/L (ref 98–111)
Creatinine, Ser: 0.73 mg/dL (ref 0.44–1.00)
GFR, Estimated: >60 mL/min
Glucose, Bld: 87 mg/dL (ref 70–99)
Potassium: 3.9 mmol/L (ref 3.5–5.1)
Sodium: 139 mmol/L (ref 135–145)

## 2024-04-01 SURGERY — EGD (ESOPHAGOGASTRODUODENOSCOPY)
Anesthesia: General

## 2024-04-01 MED ORDER — DILTIAZEM HCL-DEXTROSE 125-5 MG/125ML-% IV SOLN (PREMIX)
5.0000 mg/h | INTRAVENOUS | Status: DC
  Filled 2024-04-01: qty 125

## 2024-04-01 MED ORDER — FERROUS SULFATE 325 (65 FE) MG PO TABS
325.0000 mg | ORAL_TABLET | Freq: Every day | ORAL | Status: DC
  Administered 2024-04-02: 325 mg via ORAL
  Filled 2024-04-01: qty 1

## 2024-04-01 MED ORDER — PROPOFOL 500 MG/50ML IV EMUL
INTRAVENOUS | Status: DC | PRN
Start: 2024-04-01 — End: 2024-04-01
  Administered 2024-04-01: 125 ug/kg/min via INTRAVENOUS
  Administered 2024-04-01: 70 mg via INTRAVENOUS
  Administered 2024-04-01: 30 mg via INTRAVENOUS

## 2024-04-01 MED ORDER — METOPROLOL TARTRATE 25 MG PO TABS
25.0000 mg | ORAL_TABLET | Freq: Two times a day (BID) | ORAL | Status: DC
  Administered 2024-04-01 – 2024-04-02 (×3): 25 mg via ORAL
  Filled 2024-04-01 (×3): qty 1

## 2024-04-01 MED ORDER — STERILE WATER FOR IRRIGATION IR SOLN
Status: DC | PRN
  Administered 2024-04-01 (×2): 60 mL

## 2024-04-01 MED ORDER — LACTATED RINGERS IV SOLN: INTRAVENOUS | Status: DC

## 2024-04-01 MED ORDER — LIDOCAINE 2% (20 MG/ML) 5 ML SYRINGE
INTRAMUSCULAR | Status: DC | PRN
  Administered 2024-04-01: 60 mg via INTRAVENOUS
  Administered 2024-04-01: 40 mg via INTRAVENOUS

## 2024-04-01 MED ORDER — DILTIAZEM HCL 100 MG IV SOLR
5.0000 mg/h | INTRAVENOUS | Status: DC
  Filled 2024-04-01: qty 100

## 2024-04-01 MED ORDER — DILTIAZEM LOAD VIA INFUSION
10.0000 mg | Freq: Once | INTRAVENOUS | Status: DC
  Filled 2024-04-01: qty 10

## 2024-04-01 MED ORDER — SODIUM CHLORIDE 0.9 % IV SOLN: INTRAVENOUS | Status: DC

## 2024-04-01 MED ORDER — AMOXICILLIN-POT CLAVULANATE 875-125 MG PO TABS
1.0000 | ORAL_TABLET | Freq: Two times a day (BID) | ORAL | Status: DC
Start: 2024-04-01 — End: 2024-04-03
  Administered 2024-04-01 – 2024-04-02 (×2): 1 via ORAL
  Filled 2024-04-01 (×2): qty 1

## 2024-04-01 MED ORDER — FERROUS SULFATE 325 (65 FE) MG PO TBEC
325.0000 mg | DELAYED_RELEASE_TABLET | Freq: Every day | ORAL | Status: DC
  Filled 2024-04-01: qty 1

## 2024-04-01 NOTE — Progress Notes (Signed)
   04/01/24 0910  Vitals  Temp 98.2 F (36.8 C)  Temp Source Oral  BP (!) 142/115  MAP (mmHg) 108  BP Location Right Arm  BP Method Automatic  Patient Position (if appropriate) Sitting  Pulse Rate (!) 152  Pulse Rate Source Dinamap  Resp 20  Level of Consciousness  Level of Consciousness Alert  MEWS COLOR  MEWS Score Color Yellow  Oxygen Therapy  SpO2 97 %  O2 Device Room Air  Pain Assessment  Pain Scale 0-10  Pain Score 0  MEWS Score  MEWS Temp 0  MEWS Systolic 0  MEWS Pulse 3  MEWS RR 0  MEWS LOC 0  MEWS Score 3   Received call from central tele that pt is in afib w/ RVR in 140-150's. In to room to find pt sitting on side of bed talking with daughter. A&O, denies c/o SOB, chest pain or n/v. Apical HR 130/min. Skin W&D, color pink, cap refill < 3 sec. VS as above. MD Sidgel in to room on am rounds, updated on current VS and pt condition. MD Sidgel evaluating pt and cardiologist MD Debera in room to evaluate pt. Pt continues to deny any c/o, HR remaining 130-155/min. Orders received for medication and transfer to ICU for IV cardizem. Charge nurse and [facility] notified of pending transfer.

## 2024-04-01 NOTE — Op Note (Signed)
 [facility] Patient Name: Jody Hernandez Procedure Date: 04/01/2024 1:36 PM MRN: [id number] Date of Birth: 11/29/1950 Attending MD: Carlin POUR. Cindie HAS, [id number] CSN: [id number] Age: 73 Admit Type: Outpatient Procedure:                Colonoscopy Indications:              Heme positive stool, Iron deficiency anemia Providers:                Carlin POUR. Cindie, DO, Crystal Page, Chad Wilson,                            Technician Referring MD:              Medicines:                See the Anesthesia note for documentation of the                            administered medications Complications:            No immediate complications. Estimated Blood Loss:     Estimated blood loss: none. Procedure:                Pre-Anesthesia Assessment:                           - The anesthesia plan was to use monitored                            anesthesia care (MAC).                           After obtaining informed consent, the colonoscope                            was passed under direct vision. Throughout the                            procedure, the patient's blood pressure, pulse, and                            oxygen saturations were monitored continuously. The                            [id number]-HQ190L ([id number]) Peds Colon was introduced                            through the anus and advanced to the the cecum,                            identified by appendiceal orifice and ileocecal                            valve. The colonoscopy was somewhat difficult due                            to a redundant colon and significant  looping. The                            patient tolerated the procedure well. The quality                            of the bowel preparation was evaluated using the                            BBPS [facility] Bowel Preparation Scale) with scores                            of: Right Colon = 2 (minor amount of residual                            staining, small fragments of  stool and/or opaque                            liquid, but mucosa seen well), Transverse Colon = 2                            (minor amount of residual staining, small fragments                            of stool and/or opaque liquid, but mucosa seen                            well) and Left Colon = 2 (minor amount of residual                            staining, small fragments of stool and/or opaque                            liquid, but mucosa seen well). The total BBPS score                            equals 6. The quality of the bowel preparation was                            good. Scope In: 2:09:10 PM Scope Out: 2:24:43 PM Scope Withdrawal Time: 0 hours 6 minutes 49 seconds  Total Procedure Duration: 0 hours 15 minutes 33 seconds  Findings:      Non-bleeding internal hemorrhoids were found.      A moderate amount of semi-solid stool was found in the entire colon,       making visualization difficult. Lavage of the area was performed using       copious amounts of sterile water, resulting in clearance with good       visualization.      There is no endoscopic evidence of bleeding in the entire colon. Impression:               - Non-bleeding internal hemorrhoids.                           -  Stool in the entire examined colon.                           - No specimens collected. Moderate Sedation:      Per Anesthesia Care Recommendation:           - Return patient to hospital ward for ongoing care.                           - Resume regular diet.                           - Colon prep not ideal but for okay purposes of                            this examination. Consider repeat colonoscopy in                            the outpatient setting colon cancer screening as                            smaller polyps could have been missed today.                           - Okay to start on systemic AC from GI perspective.                           - Recommend oral iron therapy. May  benefit from                            outpatient hematology consultation. Procedure Code(s):        --- Professional ---                           [phone], Colonoscopy, flexible; diagnostic, including                            collection of specimen(s) by brushing or washing,                            when performed (separate procedure) Diagnosis Code(s):        --- Professional ---                           K64.8, Other hemorrhoids                           R19.5, Other fecal abnormalities                           D50.9, Iron deficiency anemia, unspecified CPT copyright 2022 American Medical Association. All rights reserved. The codes documented in this report are preliminary and upon coder review may  be revised to meet current compliance requirements. Carlin POUR. Cindie, DO Carlin POUR. Cindie, DO 04/01/2024 2:31:39 PM This report has been signed electronically. Number of Addenda: 0

## 2024-04-01 NOTE — Plan of Care (Signed)

## 2024-04-01 NOTE — Anesthesia Postprocedure Evaluation (Signed)
 Anesthesia Post Note  Patient: Jody Hernandez  Procedure(s) Performed: EGD (ESOPHAGOGASTRODUODENOSCOPY) COLONOSCOPY  Patient location during evaluation: Phase II Anesthesia Type: General Level of consciousness: awake and alert Pain management: pain level controlled Vital Signs Assessment: post-procedure vital signs reviewed and stable Respiratory status: spontaneous breathing, nonlabored ventilation, respiratory function stable and patient connected to nasal cannula oxygen Cardiovascular status: stable Anesthetic complications: no   There were no known notable events for this encounter.   Last Vitals:  Vitals:   04/01/24 1303 04/01/24 1430  BP: (!) 189/104 107/74  Pulse: 90 84  Resp: 16 13  Temp: 36.7 C 36.9 C  SpO2: 99% 96%    Last Pain:  Vitals:   04/01/24 1430  TempSrc:   PainSc: Asleep                 Mychal Durio L Lavren Lewan

## 2024-04-01 NOTE — Interval H&P Note (Signed)
 History and Physical Interval Note:  04/01/2024 1:16 PM  Jody Hernandez  has presented today for surgery, with the diagnosis of Melena, Anemia , Afib , Positive FOBT.  The various methods of treatment have been discussed with the patient and family. After consideration of risks, benefits and other options for treatment, the patient has consented to  Procedure(s): EGD (ESOPHAGOGASTRODUODENOSCOPY) (N/A) COLONOSCOPY (N/A) as a surgical intervention.  The patient's history has been reviewed, patient examined, no change in status, stable for surgery.  I have reviewed the patient's chart and labs.  Questions were answered to the patient's satisfaction.     Carlin MARLA Hasty

## 2024-04-01 NOTE — Progress Notes (Addendum)
 Colonoscopy/EGD planned for today. Patient finished prep with appropriate results, clear output.   Developed new onset afib with RVR this morning, Dr. Debera has evaluated patient, reviewed ECG, starting Lopressor 25 mg BID, holding off on IV dilitiazem unless needed, appropriate for procedures as long as heart rate is controlled.  Both Dr Cindie and Dr Landry (anesthesia) have been notified. Will review labs that have been ordered. As long as remains stable with HR controlled and no significant lab abnormalities, will keep plans for procedures. Nursing staff notified as well.   May have oral meds with sips of water.   I saw patient around 1130 today, and she had controlled heart rate in the 80s. No chest pain or SOB. BMP and Mag still in process. Hgb 9.7, increased from yesterday (8.8).

## 2024-04-01 NOTE — Progress Notes (Signed)
 Pt returned from endo via stretcher. Pt ambulatory from stretcher to bathroom to void and then to bed. IS noted to be a little unsteady when walking, states, Whew, I still feel a little drunk!. A&O, denies any c/o pain or SOB. VSS. Bed alarm on for safety, call bell within reach. Advised to call for needs.

## 2024-04-01 NOTE — Anesthesia Procedure Notes (Signed)
 Date/Time: 04/01/2024 1:53 PM  Performed by: Para Jerelene CROME, CRNAOxygen Delivery Method: Nasal cannula Comments: OptiFlow Nasal Cannula.

## 2024-04-01 NOTE — Consult Note (Addendum)
 CARDIOLOGY CONSULTATION  Patient ID: Jody Hernandez; [id number]; 1951-02-08   Admit date: 03/29/2024 Date of Consult: 04/01/2024  Primary Care Provider: Bertell Satterfield, MD Primary Cardiologist: New to [facility]  HISTORY OF PRESENT ILLNESS  Jody Hernandez is a 73 y.o. female currently admitted to the hospital with newly documented atrial fibrillation and iron deficiency anemia.  She states that she has felt somewhat lightheaded over the last week, not specific sense of palpitations until this prior Friday.  States that her stools have appeared dark, but no obvious hematochezia or change in stool pattern.  She has a history of hypertension, no prior documented cardiac arrhythmias.  Prior PCP was Dr. Bertell, she is in the process of establishing with a new one following his retirement.  ECGs reviewed, she presented in atrial fibrillation with RVR, heart rate settled down on low-dose Lopressor, however is back up now in the 140s to 150s this morning after sitting up in bed.  Currently n.p.o. for possible EGD/colonoscopy this afternoon.  She has not yet received her morning medications.  Not anticoagulated at this time.  CHA2DS2-VASc score is 4.  ROS  Pertinent review in history of present illness.  No fevers or chills.  Concurrently diagnosed with UTI.  Past Medical History:  Diagnosis Date   Hypertension     Past Surgical History:  Procedure Laterality Date   BREAST SURGERY     lumpectomy   CYSTOSCOPY WITH STENT PLACEMENT Left 03/09/2017   Procedure: CYSTOSCOPY, RETROGRADE PYELOGRAM WITH STENT PLACEMENT;  Surgeon: Gaston Hamilton, MD;  Location: [facility];  Service: Urology;  Laterality: Left;     INPATIENT MEDICATIONS Scheduled Meds:  diltiazem  10 mg Intravenous Once   Influenza vac split trivalent PF  0.5 mL Intramuscular Tomorrow-1000   lisinopril  20 mg Oral Daily   metoprolol tartrate  25 mg Oral BID   pantoprazole (PROTONIX) IV  40 mg Intravenous Q12H   pneumococcal  20-valent conjugate vaccine  0.5 mL Intramuscular Tomorrow-1000   polyethylene glycol  17 g Oral BID   Continuous Infusions:  cefTRIAXone  (ROCEPHIN )  IV Stopped (03/31/24 1811)   diltiazem (CARDIZEM) infusion     PRN Meds: acetaminophen  **OR** acetaminophen , ondansetron  **OR** ondansetron  (ZOFRAN ) IV  ALLERGIES No Known Allergies  SOCIAL HISTORY  Social History   Tobacco Use   Smoking status: Never   Smokeless tobacco: Never  Substance Use Topics   Alcohol use: No    FAMILY HISTORY   The patient's family history includes Hypertension in her mother; Ulcers in her father. There is no history of Kidney cancer.  PHYSICAL EXAM & DATA  Vitals:   03/31/24 1246 03/31/24 2147 04/01/24 0429 04/01/24 0910  BP: (!) 166/95 (!) 140/114 (!) 150/88 (!) 142/115  Pulse: 82 71 77 (!) 152  Resp: 20 18 18 20   Temp: 98.2 F (36.8 C) 98.4 F (36.9 C) 97.7 F (36.5 C) 98.2 F (36.8 C)  TempSrc: Oral Oral Oral Oral  SpO2: 96% 100% 97% 97%  Weight:      Height:        Intake/Output Summary (Last 24 hours) at 04/01/2024 0932 Last data filed at 04/01/2024 0355 Gross per 24 hour  Intake 440 ml  Output --  Net 440 ml   Filed Weights   03/29/24 1822  Weight: 81.6 kg   Body mass index is 32.92 kg/m.   Gen: Patient appears comfortable at rest. HEENT: Conjunctiva and lids normal, oropharynx clear Neck: Supple, no elevated JVP or carotid bruits.  Lungs: Clear to auscultation, nonlabored breathing at rest. Cardiac: Irregularly irregular, no gallop.  No significant murmur. Abdomen: Soft, nontender, bowel sounds present. Extremities: No pitting edema, distal pulses 2+. Skin: Warm and dry. Musculoskeletal: No kyphosis. Neuropsychiatric: Alert and oriented x3, affect grossly appropriate.  EKG:  An ECG dated 03/29/2024 was personally reviewed today and demonstrated:  Atrial fibrillation with RVR, nonspecific ST-T changes.  Telemetry:  I personally reviewed telemetry which shows atrial  fibrillation with RVR, heart rate 140s to 150s.  LABORATORY DATA  Chemistry Recent Labs  Lab 03/29/24 1841 03/30/24 0310  NA 138 142  K 4.3 3.9  CL 103 108  CO2 25 26  GLUCOSE 112* 93  BUN 22 18  CREATININE 0.86 0.77  CALCIUM 9.1 8.5*  GFRNONAA >60 >60  ANIONGAP 11 8    Recent Labs  Lab 03/29/24 1841 03/30/24 0310  PROT 6.7 5.7*  ALBUMIN 4.1 3.7  AST 26 18  ALT 29 24  ALKPHOS 98 82  BILITOT 0.4 0.5   Hematology Recent Labs  Lab 03/29/24 1841 03/30/24 0310 03/31/24 0503  WBC 8.0 7.1  --   RBC 3.13* 2.96*  --   HGB 8.9* 8.4* 8.8*  HCT 28.9* 27.0* 29.0*  MCV 92.3 91.2  --   MCH 28.4 28.4  --   MCHC 30.8 31.1  --   RDW 14.4 14.4  --   PLT 267 258  --     RADIOLOGY/STUDIES ECHOCARDIOGRAM COMPLETE Result Date: 03/30/2024    ECHOCARDIOGRAM REPORT   Patient Name:   Jody Hernandez Sova Date of Exam: 03/30/2024 Medical Rec #:  [id number]     Height:       62.0 in Accession #:    [id number]    Weight:       180.0 lb Date of Birth:  08-26-1950      BSA:          1.828 m Patient Age:    73 years      BP:           166/103 mmHg Patient Gender: F             HR:           82 bpm. Exam Location:  [facility] Procedure: 2D Echo, Cardiac Doppler and Color Doppler (Both Spectral and Color            Flow Doppler were utilized during procedure). Indications:    Abnormal ECG R94.31                 Atrial Fibrillation I48.91  History:        Patient has no prior history of Echocardiogram examinations.                 Arrythmias:Atrial Fibrillation; Risk Factors:Hypertension.  Sonographer:    Aida Pizza RCS Referring Phys: [id number] SANTOSH SIGDEL IMPRESSIONS  1. Left ventricular ejection fraction, by estimation, is 60 to 65%. The left ventricle has normal function. The left ventricle has no regional wall motion abnormalities. There is mild left ventricular hypertrophy of the septal segment. Left ventricular diastolic function could not be evaluated.  2. Right ventricular systolic function is  normal. The right ventricular size is mildly enlarged. There is normal pulmonary artery systolic pressure.  3. Left atrial size was severely dilated.  4. Right atrial size was severely dilated.  5. A small pericardial effusion is present. The pericardial effusion is posterior to the left ventricle.  6. The mitral valve  is normal in structure. Mild mitral valve regurgitation. No evidence of mitral stenosis.  7. The tricuspid valve is abnormal. Tricuspid valve regurgitation is moderate.  8. The aortic valve is tricuspid. Aortic valve regurgitation is not visualized. No aortic stenosis is present.  9. The inferior vena cava is dilated in size with >50% respiratory variability, suggesting right atrial pressure of 8 mmHg. Comparison(s): No prior Echocardiogram. FINDINGS  Left Ventricle: Left ventricular ejection fraction, by estimation, is 60 to 65%. The left ventricle has normal function. The left ventricle has no regional wall motion abnormalities. Strain was performed and the global longitudinal strain is indeterminate. The left ventricular internal cavity size was normal in size. There is mild left ventricular hypertrophy of the septal segment. Left ventricular diastolic function could not be evaluated due to atrial fibrillation. Left ventricular diastolic function could not be evaluated. Right Ventricle: The right ventricular size is mildly enlarged. No increase in right ventricular wall thickness. Right ventricular systolic function is normal. There is normal pulmonary artery systolic pressure. The tricuspid regurgitant velocity is 2.52  m/s, and with an assumed right atrial pressure of 8 mmHg, the estimated right ventricular systolic pressure is 33.4 mmHg. Left Atrium: Left atrial size was severely dilated. Right Atrium: Right atrial size was severely dilated. Pericardium: A small pericardial effusion is present. The pericardial effusion is posterior to the left ventricle. Mitral Valve: The mitral valve is normal  in structure. Mild mitral valve regurgitation. No evidence of mitral valve stenosis. Tricuspid Valve: The tricuspid valve is abnormal. Tricuspid valve regurgitation is moderate . No evidence of tricuspid stenosis. Aortic Valve: The aortic valve is tricuspid. Aortic valve regurgitation is not visualized. No aortic stenosis is present. Pulmonic Valve: The pulmonic valve was normal in structure. Pulmonic valve regurgitation is trivial. No evidence of pulmonic stenosis. Aorta: The aortic root is normal in size and structure. Venous: The inferior vena cava is dilated in size with greater than 50% respiratory variability, suggesting right atrial pressure of 8 mmHg. IAS/Shunts: No atrial level shunt detected by color flow Doppler. Additional Comments: 3D was performed not requiring image post processing on an independent workstation and was indeterminate.  LEFT VENTRICLE PLAX 2D LVIDd:         4.30 cm LVIDs:         2.40 cm LV PW:         0.90 cm LV IVS:        1.30 cm LVOT diam:     1.70 cm LV SV:         43 LV SV Index:   24 LVOT Area:     2.27 cm  RIGHT VENTRICLE TAPSE (M-mode): 1.6 cm LEFT ATRIUM             Index        RIGHT ATRIUM           Index LA diam:        4.40 cm 2.41 cm/m   RA Area:     23.00 cm LA Vol (A2C):   83.4 ml 45.62 ml/m  RA Volume:   71.10 ml  38.90 ml/m LA Vol (A4C):   93.7 ml 51.26 ml/m LA Biplane Vol: 88.2 ml 48.25 ml/m  AORTIC VALVE LVOT Vmax:   88.40 cm/s LVOT Vmean:  67.100 cm/s LVOT VTI:    0.190 m  AORTA Ao Root diam: 3.10 cm MITRAL VALVE                TRICUSPID VALVE MV  Area (PHT): 3.91 cm     TR Peak grad:   25.4 mmHg MV Decel Time: 194 msec     TR Vmax:        252.00 cm/s MV E velocity: 140.00 cm/s                             SHUNTS                             Systemic VTI:  0.19 m                             Systemic Diam: 1.70 cm Vishnu Priya Mallipeddi Electronically signed by Diannah Late Mallipeddi Signature Date/Time: 03/30/2024/2:08:16 PM    Final    DG Chest Port 1  View Result Date: 03/29/2024 EXAM: 1 VIEW(S) XRAY OF THE CHEST 03/29/2024 07:24:20 PM COMPARISON: 03/15/2007 CLINICAL HISTORY: dyspnea dyspnea FINDINGS: LUNGS AND PLEURA: No focal pulmonary opacity. No pulmonary edema. No pleural effusion. No pneumothorax. HEART AND MEDIASTINUM: Mild cardiomegaly. Large  hiatal hernia. BONES AND SOFT TISSUES: No acute osseous abnormality. IMPRESSION: 1. No acute cardiopulmonary abnormality. 2. Mild cardiomegaly. 3. Hiatal hernia. Electronically signed by: Franky Crease MD 03/29/2024 07:27 PM EDT RP Workstation: [id number]    ASSESSMENT & PLAN  1.  Newly documented atrial fibrillation presenting with RVR.  Reports palpitations beginning this past Friday.  CHA2DS2-VASc score is 4.  Currently not anticoagulated given iron deficiency anemia with further GI workup planned, heme positive stools.  She was started on Lopressor 12.5 mg twice daily, initially with heart rate control, however back in RVR this morning with heart rates in the 140s to 150s transiently while sitting up in bed and moving around.  Echocardiogram shows LVEF 60 to 65% with severe biatrial enlargement, small posterior pericardial effusion, mild mitral regurgitation.  After being supine and resting several minutes heart rate is back around to 100 by follow-up ECG.  2.  Primary hypertension.  She is on lisinopril 40 mg daily as an outpatient.  3.  Heme positive stools with iron deficiency anemia, hemoglobin down to 8.4.  4.  UTI, currently on Rocephin , cultures pending.  Reviewed follow-up ECG and discussed with nursing.  Heart rate around 100 at this time in atrial fibrillation.  Go-ahead with Lopressor 25 mg twice daily, give first dose now.  Should be able to proceed with EGD/colonoscopy this afternoon as planned as long as heart rate control reasonable.  Could use IV diltiazem if needed, but would hold off for now.  Further recommendations to follow pending ongoing workup.  For questions or updates,  please contact [facility] Please consult [email or web address] for contact info under   Signed, Jayson Sierras, MD  04/01/2024 9:32 AM

## 2024-04-01 NOTE — Progress Notes (Signed)
 Pt down to endo suite via WC for planned procedures.

## 2024-04-01 NOTE — Progress Notes (Signed)
   04/01/24 1121  Vitals  Temp 98.3 F (36.8 C)  Temp Source Oral  BP (!) 163/95  MAP (mmHg) 111  BP Location Right Arm  BP Method Automatic  Patient Position (if appropriate) Lying  Pulse Rate 87  Pulse Rate Source Dinamap  Resp 19  Level of Consciousness  Level of Consciousness Alert  MEWS COLOR  MEWS Score Color Green  Oxygen Therapy  SpO2 100 %  O2 Device Room Air  Pain Assessment  Pain Scale 0-10  Pain Score 0  MEWS Score  MEWS Temp 0  MEWS Systolic 0  MEWS Pulse 0  MEWS RR 0  MEWS LOC 0  MEWS Score 0   HR remains afib but rate controlled. Pt with no complaints or stated needs.

## 2024-04-01 NOTE — Progress Notes (Signed)
   04/01/24 1000  Vitals  BP (!) 158/108  MAP (mmHg) 124  BP Location Right Arm  BP Method Automatic  Patient Position (if appropriate) Sitting  Pulse Rate 96  Pulse Rate Source Dinamap  ECG Heart Rate 100  Resp 20  Level of Consciousness  Level of Consciousness Alert  MEWS COLOR  MEWS Score Color Green  Oxygen Therapy  SpO2 99 %  O2 Device Room Air  MEWS Score  MEWS Temp 0  MEWS Systolic 0  MEWS Pulse 0  MEWS RR 0  MEWS LOC 0  MEWS Score 0   EKG completed, afib w/ RVR, rate 100-115. Other VS as above. Pt's HR sustaining 85-110/min. MD Debera notified of pt's sustained lowered HR, in to room to evaluate pt. Transfer to ICU and IV cardizem cancelled. Pt to receive oral metoprolol as previously ordered. Pt aware and agreeable.

## 2024-04-01 NOTE — Transfer of Care (Addendum)
 Immediate Anesthesia Transfer of Care Note  Patient: Jody Hernandez  Procedure(s) Performed: EGD (ESOPHAGOGASTRODUODENOSCOPY) COLONOSCOPY  Patient Location: PACU  Anesthesia Type:General  Level of Consciousness: drowsy and patient cooperative  Airway & Oxygen Therapy: Patient Spontanous Breathing  Post-op Assessment: Report given to RN and Post -op Vital signs reviewed and stable  Post vital signs: Reviewed and stable  Last Vitals:  Vitals Value Taken Time  BP 107/74 04/01/24   14:30  Temp 36.9 04/01/24   14:30  Pulse 94 04/01/24 14:32  Resp 11 04/01/24 14:32  SpO2 96 % 04/01/24 14:32  Vitals shown include unfiled device data.  Last Pain:  Vitals:   04/01/24 1353  TempSrc:   PainSc: 0-No pain         Complications: No notable events documented.

## 2024-04-01 NOTE — Progress Notes (Signed)
 PROGRESS NOTE    Jody Hernandez  FMW:[id number] DOB: Jul 12, 1950 DOA: 03/29/2024 PCP: Bertell Satterfield, MD   Brief Narrative:   : Jody Hernandez is a 73 y.o. female with medical history significant of hypertension who presents to the emergency department due to about 2 week onset of generalized weakness and shortness of breath on exertion which has progressively worsened over the past week and this was associated with palpitations with sensation of heart racing.  She went to an urgent care this morning where she was noted to have atrial fibrillation with RVR and EMS was activated.  Diltiazem 10 mg x 1 was given en route to the ED.  Patient states that she has been taking Aleve once or twice daily since a week ago due to pain.  She denies chest pain, headache, fever, dizziness, abdominal pain.   ED course In the emergency department, she was tachypneic, tachycardic, BP was 167/106, other vital signs are within normal range.  Workup in ED showed normocytic anemia and normal BMP except for blood glucose of 112.  Magnesium 2.5, TSH 1.46, FOBT positive, urinalysis was suggestive of UTI, troponin x 2 was undetectable. Chest x-ray showed no acute cardiopulmonary abnormality.  Mild cardiomegaly.  Hiatal hernia Initial EKG personally reviewed showed atrial fibrillation with RVR.  Subsequent EKG personally reviewed showed atrial fibrillation with rate controlled. IV ceftriaxone  due to presumed UTI was given.   GI following, EGD/colonoscopy completed 11/3.  Assessment & Plan:   Principal Problem:   GI bleed Active Problems:   Hypertension   Iron deficiency anemia due to chronic blood loss   Paroxysmal atrial fibrillation with RVR (HCC)   Atrial fibrillation (HCC)   Anemia   Melena   Heme positive stool    #Anemia, likely acute on chronic Hemoccult positive  -Patient reports NSAID use. - EGD/colonoscopy completed today. Large hiatal hernia with Ole lesions, no active bleeding, biopsy taken,  H. pylori test pending. Colonoscopy with no significant abnormalities/no stigmata of bleeding.  Iron deficiency anemia: Will restart oral iron   # New onset of atrial fibrillation New onset A-fib RVR on admission, persistent A-fib but rate controlled on admission however patient became tachycardic 11/3 morning and was initiated on IV Cardizem.  Metoprolol dose increased to 25 mg twice daily.  Cardiology consulted for clearance.  Once rate was controlled, patient taken  for endoscopy/colonoscopy  echo 11/1 shows normal left ventricular function, no wall motion abnormalities, severe dilated left and right atrium  TSH is normal 1.46. Will start Eliquis tomorrow  UTI: Urine culture positive for E. coli, susceptibilities pending.  Will switch antibiotics to p.o. for 2 more days  Generalized weakness/shortness of breath likely secondary to combination of above.  essential hypertension Continue metoprolol, lisinopril   Obesity class I (BMI 32.92) Diet and lifestyle modification    Advance Care Planning: Full code   Consults: Gastroenterology   Family Communication: family at bedside (all questions answered to satisfaction)   Severity of Illness: The appropriate patient status for this patient is OBSERVATION. Observation status is judged to be reasonable and necessary in order to provide the required intensity of service to ensure the patient's safety. The patient's presenting symptoms, physical exam findings, and initial radiographic and laboratory data in the context of their medical condition is felt to place them at decreased risk for further clinical deterioration. Furthermore, it is anticipated that the patient will be medically stable for discharge from the hospital within 2 midnights of admission.    Subjective:  Patient seen and examined at the bedside.  Denies shortness of breath.  Denied any bowel movements.  Vital signs are stable.  Objective: Vitals:   04/01/24 1121  04/01/24 1303 04/01/24 1430 04/01/24 1445  BP: (!) 163/95 (!) 189/104 107/74 (!) 140/91  Pulse: 87 90 84 76  Resp: 19 16 13 19   Temp: 98.3 F (36.8 C) 98.1 F (36.7 C) 98.4 F (36.9 C)   TempSrc: Oral Oral    SpO2: 100% 99% 96% 98%  Weight:  81.6 kg    Height:  5' 2 (1.575 m)      Intake/Output Summary (Last 24 hours) at 04/01/2024 1501 Last data filed at 04/01/2024 1433 Gross per 24 hour  Intake 1090 ml  Output --  Net 1090 ml   Filed Weights   03/29/24 1822 04/01/24 1303  Weight: 81.6 kg 81.6 kg    Examination:  General exam: Appears calm and comfortable  Respiratory system: Bilateral decreased breath sounds at bases Cardiovascular system: S1 & S2 heard, Rate controlled Gastrointestinal system: Abdomen is nondistended, soft and nontender. Normal bowel sounds heard. Extremities: No cyanosis, clubbing, edema, chronic venous stasis changes Central nervous system: Alert and oriented. No focal neurological deficits. Moving extremities   Data Reviewed: I have personally reviewed following labs and imaging studies  CBC: Recent Labs  Lab 03/29/24 1841 03/30/24 0310 03/31/24 0503 04/01/24 1035  WBC 8.0 7.1  --  6.7  NEUTROABS 5.9  --   --  5.4  HGB 8.9* 8.4* 8.8* 9.7*  HCT 28.9* 27.0* 29.0* 32.8*  MCV 92.3 91.2  --  93.4  PLT 267 258  --  286   Basic Metabolic Panel: Recent Labs  Lab 03/29/24 1841 03/30/24 0310 04/01/24 1035  NA 138 142 139  K 4.3 3.9 3.9  CL 103 108 105  CO2 25 26 23   GLUCOSE 112* 93 87  BUN 22 18 13   CREATININE 0.86 0.77 0.73  CALCIUM 9.1 8.5* 9.0  MG 2.5* 2.4 2.4  PHOS  --  3.6  --    GFR: Estimated Creatinine Clearance: 60.1 mL/min (by C-G formula based on SCr of 0.73 mg/dL). Liver Function Tests: Recent Labs  Lab 03/29/24 1841 03/30/24 0310  AST 26 18  ALT 29 24  ALKPHOS 98 82  BILITOT 0.4 0.5  PROT 6.7 5.7*  ALBUMIN 4.1 3.7   No results for input(s): LIPASE, AMYLASE in the last 168 hours. No results for input(s):  AMMONIA in the last 168 hours. Coagulation Profile: No results for input(s): INR, PROTIME in the last 168 hours. Cardiac Enzymes: No results for input(s): CKTOTAL, CKMB, CKMBINDEX, TROPONINI in the last 168 hours. BNP (last 3 results) No results for input(s): PROBNP in the last 8760 hours. HbA1C: No results for input(s): HGBA1C in the last 72 hours. CBG: No results for input(s): GLUCAP in the last 168 hours. Lipid Profile: No results for input(s): CHOL, HDL, LDLCALC, TRIG, CHOLHDL, LDLDIRECT in the last 72 hours. Thyroid  Function Tests: Recent Labs    03/29/24 1841  TSH 1.460   Anemia Panel: Recent Labs    03/31/24 0503  FERRITIN 16  TIBC 406  IRON 22*   Sepsis Labs: No results for input(s): PROCALCITON, LATICACIDVEN in the last 168 hours.  Recent Results (from the past 240 hours)  Urine Culture     Status: Abnormal   Collection Time: 03/29/24  7:58 PM   Specimen: Urine, Clean Catch  Result Value Ref Range Status   Specimen Description   Final  URINE, CLEAN CATCH Performed at [facility], [address], [place], [place] [address]    Special Requests   Final    NONE Performed at [facility], [address] [place], [place] [address]    Culture >=100,000 COLONIES/mL ESCHERICHIA COLI (A)  Final   Report Status 04/01/2024 FINAL  Final   Organism ID, Bacteria ESCHERICHIA COLI (A)  Final      Susceptibility   Escherichia coli - MIC*    AMPICILLIN <=2 SENSITIVE Sensitive     CEFAZOLIN (URINE) Value in next row Sensitive      <=1 SENSITIVEThis is a modified FDA-approved test that has been validated and its performance characteristics determined by the reporting laboratory.  This laboratory is certified under the Clinical Laboratory Improvement Amendments CLIA as qualified to perform high complexity clinical laboratory testing.    CEFEPIME Value in next row Sensitive      <=1 SENSITIVEThis is a modified FDA-approved test that has been  validated and its performance characteristics determined by the reporting laboratory.  This laboratory is certified under the Clinical Laboratory Improvement Amendments CLIA as qualified to perform high complexity clinical laboratory testing.    ERTAPENEM Value in next row Sensitive      <=1 SENSITIVEThis is a modified FDA-approved test that has been validated and its performance characteristics determined by the reporting laboratory.  This laboratory is certified under the Clinical Laboratory Improvement Amendments CLIA as qualified to perform high complexity clinical laboratory testing.    CEFTRIAXONE  Value in next row Sensitive      <=1 SENSITIVEThis is a modified FDA-approved test that has been validated and its performance characteristics determined by the reporting laboratory.  This laboratory is certified under the Clinical Laboratory Improvement Amendments CLIA as qualified to perform high complexity clinical laboratory testing.    CIPROFLOXACIN  Value in next row Sensitive      <=1 SENSITIVEThis is a modified FDA-approved test that has been validated and its performance characteristics determined by the reporting laboratory.  This laboratory is certified under the Clinical Laboratory Improvement Amendments CLIA as qualified to perform high complexity clinical laboratory testing.    GENTAMICIN Value in next row Sensitive      <=1 SENSITIVEThis is a modified FDA-approved test that has been validated and its performance characteristics determined by the reporting laboratory.  This laboratory is certified under the Clinical Laboratory Improvement Amendments CLIA as qualified to perform high complexity clinical laboratory testing.    NITROFURANTOIN Value in next row Sensitive      <=1 SENSITIVEThis is a modified FDA-approved test that has been validated and its performance characteristics determined by the reporting laboratory.  This laboratory is certified under the Clinical Laboratory Improvement  Amendments CLIA as qualified to perform high complexity clinical laboratory testing.    TRIMETH/SULFA Value in next row Sensitive      <=1 SENSITIVEThis is a modified FDA-approved test that has been validated and its performance characteristics determined by the reporting laboratory.  This laboratory is certified under the Clinical Laboratory Improvement Amendments CLIA as qualified to perform high complexity clinical laboratory testing.    AMPICILLIN/SULBACTAM Value in next row Sensitive      <=1 SENSITIVEThis is a modified FDA-approved test that has been validated and its performance characteristics determined by the reporting laboratory.  This laboratory is certified under the Clinical Laboratory Improvement Amendments CLIA as qualified to perform high complexity clinical laboratory testing.    PIP/TAZO Value in next row Sensitive      <=4 SENSITIVEThis  is a modified FDA-approved test that has been validated and its performance characteristics determined by the reporting laboratory.  This laboratory is certified under the Clinical Laboratory Improvement Amendments CLIA as qualified to perform high complexity clinical laboratory testing.    MEROPENEM Value in next row Sensitive      <=4 SENSITIVEThis is a modified FDA-approved test that has been validated and its performance characteristics determined by the reporting laboratory.  This laboratory is certified under the Clinical Laboratory Improvement Amendments CLIA as qualified to perform high complexity clinical laboratory testing.    * >=100,000 COLONIES/mL ESCHERICHIA COLI         Radiology Studies: No results found.       Scheduled Meds:  Influenza vac split trivalent PF  0.5 mL Intramuscular Tomorrow-1000   [MAR Hold] lisinopril  20 mg Oral Daily   [MAR Hold] metoprolol tartrate  25 mg Oral BID   [MAR Hold] pantoprazole (PROTONIX) IV  40 mg Intravenous Q12H   [MAR Hold] pneumococcal 20-valent conjugate vaccine  0.5 mL  Intramuscular Tomorrow-1000   [MAR Hold] polyethylene glycol  17 g Oral BID   Continuous Infusions:  sodium chloride      lactated ringers  40 mL/hr at 04/01/24 1309   lactated ringers  10 mL/hr at 04/01/24 1430   lactated ringers             Derryl Duval, MD [facility] 04/01/2024, 3:01 PM

## 2024-04-01 NOTE — Op Note (Signed)
 [facility] Patient Name: Jody Hernandez Procedure Date: 04/01/2024 1:37 PM MRN: [id number] Date of Birth: 12-13-1950 Attending MD: Carlin POUR. Cindie HAS, [id number] CSN: [id number] Age: 73 Admit Type: Outpatient Procedure:                Upper GI endoscopy Indications:              Iron deficiency anemia Providers:                Carlin POUR. Cindie, DO, Crystal Page, Chad Wilson,                            Technician Referring MD:              Medicines:                See the Anesthesia note for documentation of the                            administered medications Complications:            No immediate complications. Estimated Blood Loss:     Estimated blood loss was minimal. Procedure:                Pre-Anesthesia Assessment:                           - The anesthesia plan was to use monitored                            anesthesia care (MAC).                           After obtaining informed consent, the endoscope was                            passed under direct vision. Throughout the                            procedure, the patient's blood pressure, pulse, and                            oxygen saturations were monitored continuously. The                            [id number] ([id number]) Upper was introduced through                            the mouth, and advanced to the second part of                            duodenum. The upper GI endoscopy was accomplished                            without difficulty. The patient tolerated the                            procedure well. Scope In: 1:58:36 PM  Scope Out: 2:04:55 PM Total Procedure Duration: 0 hours 6 minutes 19 seconds  Findings:      The examined esophagus was normal.      A large hiatal hernia with multiple Cameron ulcers was found.      Diffuse moderate inflammation characterized by friability was found in       the entire examined stomach. Biopsies were taken with a cold forceps for       Helicobacter pylori  testing.      A single 10 mm sessile polyp with no bleeding was found in the second       portion of the duodenum. Biopsies were taken with a cold forceps for       histology. Impression:               - Normal esophagus.                           - Large hiatal hernia with multiple Cameron ulcers.                           - Gastritis. Biopsied.                           - A single duodenal polyp. Biopsied. Moderate Sedation:      Per Anesthesia Care Recommendation:           - Return patient to hospital ward for ongoing care.                           - Use Protonix (pantoprazole) 40 mg PO BID.                           - No ibuprofen, naproxen, or other non-steroidal                            anti-inflammatory drugs.                           - See colonoscopy report for further recommendations Procedure Code(s):        --- Professional ---                           [phone], Esophagogastroduodenoscopy, flexible,                            transoral; with biopsy, single or multiple Diagnosis Code(s):        --- Professional ---                           K44.9, Diaphragmatic hernia without obstruction or                            gangrene                           K25.9, Gastric ulcer, unspecified as acute or  chronic, without hemorrhage or perforation                           K29.70, Gastritis, unspecified, without bleeding                           K31.7, Polyp of stomach and duodenum                           D50.9, Iron deficiency anemia, unspecified CPT copyright 2022 American Medical Association. All rights reserved. The codes documented in this report are preliminary and upon coder review may  be revised to meet current compliance requirements. Carlin POUR. Cindie, DO Carlin POUR. Cindie, DO 04/01/2024 2:27:17 PM This report has been signed electronically. Number of Addenda: 0

## 2024-04-01 NOTE — Anesthesia Preprocedure Evaluation (Addendum)
 Anesthesia Evaluation  Patient identified by MRN, date of birth, ID band Patient awake    Reviewed: Allergy & Precautions, H&P , NPO status , Patient's Chart, lab work & pertinent test results, reviewed documented beta blocker date and time   Airway Mallampati: II  TM Distance: >3 FB Neck ROM: full    Dental no notable dental hx. (+) Dental Advisory Given, Teeth Intact   Pulmonary neg pulmonary ROS   Pulmonary exam normal breath sounds clear to auscultation       Cardiovascular Exercise Tolerance: Good hypertension, Normal cardiovascular exam+ dysrhythmias Atrial Fibrillation  Rhythm:regular Rate:Normal  Acute onset AF with RVR on this admission.  Controlled with diltiazem drip ( which was stopped today ) and cardiology said OK to proceed with EGD.  Echo 11/1 good EF with no clot in atrium.  Her rate seems to be in the 90s right now.   Neuro/Psych negative neurological ROS  negative psych ROS   GI/Hepatic Neg liver ROS,,,GI bleed   Endo/Other  negative endocrine ROS    Renal/GU negative Renal ROS  negative genitourinary   Musculoskeletal   Abdominal   Peds  Hematology negative hematology ROS (+) Blood dyscrasia, anemia   Anesthesia Other Findings Sepsis history. Chronic venous insufficiency  Reproductive/Obstetrics negative OB ROS                              Anesthesia Physical Anesthesia Plan  ASA: 3  Anesthesia Plan: General   Post-op Pain Management: Minimal or no pain anticipated   Induction: Intravenous  PONV Risk Score and Plan: Propofol  infusion  Airway Management Planned: Natural Airway and Nasal Cannula  Additional Equipment: None  Intra-op Plan:   Post-operative Plan:   Informed Consent: I have reviewed the patients History and Physical, chart, labs and discussed the procedure including the risks, benefits and alternatives for the proposed anesthesia with the  patient or authorized representative who has indicated his/her understanding and acceptance.     Dental Advisory Given  Plan Discussed with: CRNA  Anesthesia Plan Comments:          Anesthesia Quick Evaluation

## 2024-04-02 ENCOUNTER — Telehealth (HOSPITAL_COMMUNITY): Payer: Self-pay | Admitting: Pharmacy Technician

## 2024-04-02 ENCOUNTER — Other Ambulatory Visit (HOSPITAL_COMMUNITY): Payer: Self-pay

## 2024-04-02 ENCOUNTER — Telehealth: Payer: Self-pay | Admitting: Gastroenterology

## 2024-04-02 DIAGNOSIS — D649 Anemia, unspecified: Secondary | ICD-10-CM | POA: Diagnosis not present

## 2024-04-02 DIAGNOSIS — I4891 Unspecified atrial fibrillation: Secondary | ICD-10-CM | POA: Diagnosis not present

## 2024-04-02 DIAGNOSIS — I10 Essential (primary) hypertension: Secondary | ICD-10-CM | POA: Diagnosis not present

## 2024-04-02 DIAGNOSIS — I4819 Other persistent atrial fibrillation: Secondary | ICD-10-CM

## 2024-04-02 DIAGNOSIS — K922 Gastrointestinal hemorrhage, unspecified: Secondary | ICD-10-CM | POA: Diagnosis not present

## 2024-04-02 MED ORDER — APIXABAN 5 MG PO TABS
5.0000 mg | ORAL_TABLET | Freq: Two times a day (BID) | ORAL | Status: DC
  Administered 2024-04-02: 5 mg via ORAL
  Filled 2024-04-02: qty 1

## 2024-04-02 MED ORDER — APIXABAN 5 MG PO TABS: 5.0000 mg | ORAL_TABLET | Freq: Two times a day (BID) | ORAL | 2 refills | Status: DC

## 2024-04-02 MED ORDER — METOPROLOL TARTRATE 25 MG PO TABS: 25.0000 mg | ORAL_TABLET | Freq: Two times a day (BID) | ORAL | 0 refills | Status: DC

## 2024-04-02 MED ORDER — AMOXICILLIN-POT CLAVULANATE 875-125 MG PO TABS: 1.0000 | ORAL_TABLET | Freq: Two times a day (BID) | ORAL | 0 refills | Status: DC

## 2024-04-02 MED ORDER — FERROUS SULFATE 325 (65 FE) MG PO TABS: 325.0000 mg | ORAL_TABLET | Freq: Every day | ORAL | 3 refills | Status: AC

## 2024-04-02 MED ORDER — PANTOPRAZOLE SODIUM 40 MG PO TBEC: 40.0000 mg | DELAYED_RELEASE_TABLET | Freq: Two times a day (BID) | ORAL | 0 refills | Status: DC

## 2024-04-02 NOTE — Discharge Instructions (Signed)

## 2024-04-02 NOTE — Telephone Encounter (Signed)
 Patient needs hospital follow up in 4-6 weeks with Dr. Cinderella, or Therisa Stager NP if Dr. Cinderella not available. Dx: hiatal hernia with cameron ulcers, possible need for another colonoscopy due to suboptimal bowel prep, IDA.

## 2024-04-02 NOTE — Telephone Encounter (Signed)
 Patient Product/process development scientist completed.    The patient is insured through [employer]. Patient has Medicare and is not eligible for a copay card, but may be able to apply for patient assistance or Medicare RX Payment Plan (Patient Must reach out to their plan, if eligible for payment plan), if available.    Ran test claim for Eliquis 5 mg and the current 30 day co-pay is $40.00.   This test claim was processed through [facility]- copay amounts may vary at other pharmacies due to pharmacy/plan contracts, or as the patient moves through the different stages of their insurance plan.     Reyes Sharps, CPHT Pharmacy Technician Patient Advocate Specialist Lead [facility] Patient Advocate Team Direct Number: [phone]  Fax: [phone]

## 2024-04-02 NOTE — Plan of Care (Signed)
  Problem: Education: Goal: Knowledge of General Education information will improve Description: Including pain rating scale, medication(s)/side effects and non-pharmacologic comfort measures Outcome: Adequate for Discharge   Problem: Clinical Measurements: Goal: Ability to maintain clinical measurements within normal limits will improve Outcome: Adequate for Discharge Goal: Diagnostic test results will improve Outcome: Adequate for Discharge   Problem: Activity: Goal: Risk for activity intolerance will decrease Outcome: Adequate for Discharge   Problem: Coping: Goal: Level of anxiety will decrease Outcome: Adequate for Discharge   Problem: Elimination: Goal: Will not experience complications related to bowel motility Outcome: Adequate for Discharge Goal: Will not experience complications related to urinary retention Outcome: Adequate for Discharge   Problem: Pain Managment: Goal: General experience of comfort will improve and/or be controlled Outcome: Adequate for Discharge

## 2024-04-02 NOTE — Progress Notes (Signed)
 Progress Note  Patient Name: Jody Hernandez Date of Encounter: 04/02/2024  Primary Cardiologist: New to [facility]  Interval Summary  Chart reviewed.  Patient underwent EGD yesterday showing large hiatal hernia with multiple Cameron ulcers, diffuse moderate inflammatory changes within the stomach with biopsies taken, 10 mm polyp within the duodenum which was biopsied.  No active bleeding.  No bleeding source identified on colonoscopy either.  GI cleared patient for initiation of anticoagulation.  She reports no chest pain or palpitations, eating breakfast this morning.  Would like to go home soon.  Vital Signs  Vitals:   04/01/24 1506 04/01/24 1950 04/01/24 2233 04/02/24 0443  BP: (!) 160/92 118/79 118/79 (!) 160/94  Pulse: 86 87 87 75  Resp: 20 18  18   Temp:  97.6 F (36.4 C)  98.3 F (36.8 C)  TempSrc:  Oral  Oral  SpO2: 99% 94%  96%  Weight:      Height:        Intake/Output Summary (Last 24 hours) at 04/02/2024 0843 Last data filed at 04/01/2024 1456 Gross per 24 hour  Intake 721.33 ml  Output --  Net 721.33 ml   Filed Weights   03/29/24 1822 04/01/24 1303  Weight: 81.6 kg 81.6 kg    Physical Exam  GEN: No acute distress.   Neck: No JVD. Cardiac: Irregularly irregular without significant murmur or gallop.  Respiratory: Nonlabored. Clear to auscultation bilaterally. GI: Soft, nontender, bowel sounds present. MS: No edema.  ECG/Telemetry  Telemetry reviewed showing rate controlled atrial fibrillation.  Labs  Chemistry Recent Labs  Lab 03/29/24 1841 03/30/24 0310 04/01/24 1035  NA 138 142 139  K 4.3 3.9 3.9  CL 103 108 105  CO2 25 26 23   GLUCOSE 112* 93 87  BUN 22 18 13   CREATININE 0.86 0.77 0.73  CALCIUM 9.1 8.5* 9.0  PROT 6.7 5.7*  --   ALBUMIN 4.1 3.7  --   AST 26 18  --   ALT 29 24  --   ALKPHOS 98 82  --   BILITOT 0.4 0.5  --   GFRNONAA >60 >60 >60  ANIONGAP 11 8 12     Hematology Recent Labs  Lab 03/29/24 1841  03/30/24 0310 03/31/24 0503 04/01/24 1035  WBC 8.0 7.1  --  6.7  RBC 3.13* 2.96*  --  3.51*  HGB 8.9* 8.4* 8.8* 9.7*  HCT 28.9* 27.0* 29.0* 32.8*  MCV 92.3 91.2  --  93.4  MCH 28.4 28.4  --  27.6  MCHC 30.8 31.1  --  29.6*  RDW 14.4 14.4  --  14.0  PLT 267 258  --  286   Cardiac Studies  Echocardiogram 03/30/2024:  1. Left ventricular ejection fraction, by estimation, is 60 to 65%. The  left ventricle has normal function. The left ventricle has no regional  wall motion abnormalities. There is mild left ventricular hypertrophy of  the septal segment. Left ventricular  diastolic function could not be evaluated.   2. Right ventricular systolic function is normal. The right ventricular  size is mildly enlarged. There is normal pulmonary artery systolic  pressure.   3. Left atrial size was severely dilated.   4. Right atrial size was severely dilated.   5. A small pericardial effusion is present. The pericardial effusion is  posterior to the left ventricle.   6. The mitral valve is normal in structure. Mild mitral valve  regurgitation. No evidence of mitral stenosis.   7. The tricuspid  valve is abnormal. Tricuspid valve regurgitation is  moderate.   8. The aortic valve is tricuspid. Aortic valve regurgitation is not  visualized. No aortic stenosis is present.   9. The inferior vena cava is dilated in size with >50% respiratory  variability, suggesting right atrial pressure of 8 mmHg.   Assessment & Plan  1.  Newly documented atrial fibrillation presenting with RVR.  Reports palpitations beginning this past Friday.  CHA2DS2-VASc score is 4.  Heart rate controlled now on Lopressor 25 mg twice daily.  Starting Eliquis 5 mg twice daily for stroke prophylaxis.   2.  Primary hypertension.  Currently on lisinopril 20 mg daily.   3.  Heme positive stools with iron deficiency anemia, hemoglobin down to 8.4.  EGD and colonoscopy performed yesterday.  Found to have large hiatal hernia with  multiple Cameron ulcers, diffuse moderate inflammatory changes within the stomach and a 10 mm polyp in the duodenum.  No active bleeding noted within the stomach or on colonoscopy.  She was cleared by GI to initiate anticoagulation.   4.  UTI, currently on Rocephin , cultures positive for E. coli.  Patient stable for discharge from a cardiac perspective.  Continue Lopressor 25 mg twice daily and Eliquis 5 mg twice daily.  We will schedule office follow-up, if she remains in atrial fibrillation at that time can consider elective cardioversion.  Check CBC for office follow-up.  For questions or updates, please contact [facility] Please consult [email or web address] for contact info under   Signed, Jayson Sierras, MD  04/02/2024, 8:43 AM

## 2024-04-02 NOTE — Discharge Summary (Addendum)
 Physician Discharge Summary  Jody Hernandez FMW:[id number] DOB: 14-Feb-1951 DOA: 03/29/2024  PCP: Bertell Satterfield, MD  Admit date: 03/29/2024 Discharge date: 04/02/2024  Admitted From: Home Disposition: Home  Recommendations for Outpatient Follow-up:  Follow up with PCP in 1 week with repeat CBC/BMP Initiated on Eliquis, metoprolol for persistent A-fib.  Follow-up with cardiology in 3 to 4 weeks for potential cardioversion as needed Follow-up with GI for pending pathology  Home Health: No Equipment/Devices: None  Discharge Condition: Stable CODE STATUS: Full Diet recommendation: Heart healthy  Brief/Interim Summary:  Jody Hernandez is a 73 y.o. female with medical history significant of hypertension who presents to the emergency department due to about 2 week onset of generalized weakness and shortness of breath on exertion which has progressively worsened over the past week and this was associated with palpitations with sensation of heart racing.  She went to an urgent care where she was noted to have atrial fibrillation with RVR and EMS was activated.  Diltiazem 10 mg x 1 was given en route to the ED. she was noted to have anemia (Hemglobin 8.8 ) as well as positive FOBT.  She was also noted to have UTI with urine culture subsequently growing E. coli, pansensitive.  She received 3 days of IV ceftriaxone , will receive 2 days of Augmentin for 5 days therapy.  Hospital course notable for persistent A-fib.  She had occasional bursts of RVR and is initiated on metoprolol 25 twice daily.  She is also initiated on Eliquis. She was evaluated by cardiology as well and will be arranged for follow-up outpatient for potential cardioversion if needed.  In regards to evaluation of anemia and positive FOBT, patient reported no frank hematochezia or melena.  She underwent EGD and colonoscopy revealing large hiatal hernia with Ole lesions, no active bleeding, biopsy taken.  Colonoscopy completed with  removal of polyp, no stigmata of bleeding noted.  She will need to follow-up with GI as well.  Started on iron tablets as well as Protonix twice daily.  Discharged home in stable condition.   Discharge Diagnoses:  Principal Problem:   GI bleed Active Problems:   Hypertension   Iron deficiency anemia due to chronic blood loss   Paroxysmal atrial fibrillation with RVR (HCC)   Atrial fibrillation (HCC)   Anemia   Melena   Heme positive stool    Discharge Instructions  Discharge Instructions     (HEART FAILURE PATIENTS) Call MD:  Anytime you have any of the following symptoms: 1) 3 pound weight gain in 24 hours or 5 pounds in 1 week 2) shortness of breath, with or without a dry hacking cough 3) swelling in the hands, feet or stomach 4) if you have to sleep on extra pillows at night in order to breathe.   Complete by: As directed    Ambulatory referral to Cardiology   Complete by: As directed    Ambulatory referral to Gastroenterology   Complete by: As directed    What is the reason for referral?: Other   Call MD for:  difficulty breathing, headache or visual disturbances   Complete by: As directed    Call MD for:  persistant dizziness or light-headedness   Complete by: As directed    Call MD for:  persistant nausea and vomiting   Complete by: As directed    Call MD for:  severe uncontrolled pain   Complete by: As directed    Diet - low sodium heart healthy   Complete by:  As directed    Discharge instructions   Complete by: As directed    1.  Start taking metoprolol 25 mg twice daily for heart rate control. 2.  Start taking Eliquis 5 mg twice daily for anticoagulation/stroke prophylaxis 3.  Start taking Protonix for gastritis/Cameron ulcers.  Avoid NSAIDs like naproxen, aspirin, ibuprofen etc. 3.  Follow-up with cardiology in 4 weeks 4.  Follow-up with PCP in 1 week.  Check CBC to ensure stable hemoglobin. 5.  Return to the ER if you experience dark stools, bloody stools,  dizziness, low blood pressure, fainting or near fainting spells 5.  Follow-up with GI for pathology findings   Increase activity slowly   Complete by: As directed       Allergies as of 04/02/2024   No Known Allergies      Medication List     STOP taking these medications    naproxen sodium 220 MG tablet Commonly known as: ALEVE       TAKE these medications    amoxicillin-clavulanate 875-125 MG tablet Commonly known as: AUGMENTIN Take 1 tablet by mouth every 12 (twelve) hours.   apixaban 5 MG Tabs tablet Commonly known as: ELIQUIS Take 1 tablet (5 mg total) by mouth 2 (two) times daily.   cetirizine 10 MG tablet Commonly known as: ZYRTEC Take 10 mg by mouth daily as needed for allergies or rhinitis.   ferrous sulfate 325 (65 FE) MG tablet Take 1 tablet (325 mg total) by mouth daily with breakfast. Start taking on: April 03, 2024   guaifenesin 100 MG/5ML syrup Commonly known as: ROBITUSSIN Take 200 mg by mouth 3 (three) times daily as needed for cough or congestion.   lisinopril 40 MG tablet Commonly known as: ZESTRIL Take 40 mg by mouth at bedtime.   metoprolol tartrate 25 MG tablet Commonly known as: LOPRESSOR Take 1 tablet (25 mg total) by mouth 2 (two) times daily.   pantoprazole 40 MG tablet Commonly known as: Protonix Take 1 tablet (40 mg total) by mouth 2 (two) times daily before a meal.        Follow-up Information     Debera Jayson MATSU, MD. Go in 3 week(s).   Specialty: Cardiology Contact information: [address] [place] [address] [phone]         Bertell Satterfield, MD. Schedule an appointment as soon as possible for a visit in 1 week(s).   Specialty: Internal Medicine Contact information: [address] [place] [address] [phone]         Cindie Carlin POUR, DO. Go in 1 month(s).   Specialty: Gastroenterology Contact information: [address] [place] [address] [phone]                 No Known Allergies  Consultations:    Procedures/Studies: ECHOCARDIOGRAM COMPLETE Result Date: 03/30/2024    ECHOCARDIOGRAM REPORT   Patient Name:   Jody Hernandez Date of Exam: 03/30/2024 Medical Rec #:  [id number]     Height:       62.0 in Accession #:    [id number]    Weight:       180.0 lb Date of Birth:  1950-07-17      BSA:          1.828 m Patient Age:    73 years      BP:           166/103 mmHg Patient Gender: F  HR:           82 bpm. Exam Location:  [facility] Procedure: 2D Echo, Cardiac Doppler and Color Doppler (Both Spectral and Color            Flow Doppler were utilized during procedure). Indications:    Abnormal ECG R94.31                 Atrial Fibrillation I48.91  History:        Patient has no prior history of Echocardiogram examinations.                 Arrythmias:Atrial Fibrillation; Risk Factors:Hypertension.  Sonographer:    Aida Pizza RCS Referring Phys: [id number] Khara Renaud IMPRESSIONS  1. Left ventricular ejection fraction, by estimation, is 60 to 65%. The left ventricle has normal function. The left ventricle has no regional wall motion abnormalities. There is mild left ventricular hypertrophy of the septal segment. Left ventricular diastolic function could not be evaluated.  2. Right ventricular systolic function is normal. The right ventricular size is mildly enlarged. There is normal pulmonary artery systolic pressure.  3. Left atrial size was severely dilated.  4. Right atrial size was severely dilated.  5. A small pericardial effusion is present. The pericardial effusion is posterior to the left ventricle.  6. The mitral valve is normal in structure. Mild mitral valve regurgitation. No evidence of mitral stenosis.  7. The tricuspid valve is abnormal. Tricuspid valve regurgitation is moderate.  8. The aortic valve is tricuspid. Aortic valve regurgitation is not visualized. No aortic stenosis is present.  9. The inferior vena cava is dilated in size with  >50% respiratory variability, suggesting right atrial pressure of 8 mmHg. Comparison(s): No prior Echocardiogram. FINDINGS  Left Ventricle: Left ventricular ejection fraction, by estimation, is 60 to 65%. The left ventricle has normal function. The left ventricle has no regional wall motion abnormalities. Strain was performed and the global longitudinal strain is indeterminate. The left ventricular internal cavity size was normal in size. There is mild left ventricular hypertrophy of the septal segment. Left ventricular diastolic function could not be evaluated due to atrial fibrillation. Left ventricular diastolic function could not be evaluated. Right Ventricle: The right ventricular size is mildly enlarged. No increase in right ventricular wall thickness. Right ventricular systolic function is normal. There is normal pulmonary artery systolic pressure. The tricuspid regurgitant velocity is 2.52  m/s, and with an assumed right atrial pressure of 8 mmHg, the estimated right ventricular systolic pressure is 33.4 mmHg. Left Atrium: Left atrial size was severely dilated. Right Atrium: Right atrial size was severely dilated. Pericardium: A small pericardial effusion is present. The pericardial effusion is posterior to the left ventricle. Mitral Valve: The mitral valve is normal in structure. Mild mitral valve regurgitation. No evidence of mitral valve stenosis. Tricuspid Valve: The tricuspid valve is abnormal. Tricuspid valve regurgitation is moderate . No evidence of tricuspid stenosis. Aortic Valve: The aortic valve is tricuspid. Aortic valve regurgitation is not visualized. No aortic stenosis is present. Pulmonic Valve: The pulmonic valve was normal in structure. Pulmonic valve regurgitation is trivial. No evidence of pulmonic stenosis. Aorta: The aortic root is normal in size and structure. Venous: The inferior vena cava is dilated in size with greater than 50% respiratory variability, suggesting right atrial  pressure of 8 mmHg. IAS/Shunts: No atrial level shunt detected by color flow Doppler. Additional Comments: 3D was performed not requiring image post processing on an independent workstation and was indeterminate.  LEFT VENTRICLE PLAX 2D LVIDd:         4.30 cm LVIDs:         2.40 cm LV PW:         0.90 cm LV IVS:        1.30 cm LVOT diam:     1.70 cm LV SV:         43 LV SV Index:   24 LVOT Area:     2.27 cm  RIGHT VENTRICLE TAPSE (M-mode): 1.6 cm LEFT ATRIUM             Index        RIGHT ATRIUM           Index LA diam:        4.40 cm 2.41 cm/m   RA Area:     23.00 cm LA Vol (A2C):   83.4 ml 45.62 ml/m  RA Volume:   71.10 ml  38.90 ml/m LA Vol (A4C):   93.7 ml 51.26 ml/m LA Biplane Vol: 88.2 ml 48.25 ml/m  AORTIC VALVE LVOT Vmax:   88.40 cm/s LVOT Vmean:  67.100 cm/s LVOT VTI:    0.190 m  AORTA Ao Root diam: 3.10 cm MITRAL VALVE                TRICUSPID VALVE MV Area (PHT): 3.91 cm     TR Peak grad:   25.4 mmHg MV Decel Time: 194 msec     TR Vmax:        252.00 cm/s MV E velocity: 140.00 cm/s                             SHUNTS                             Systemic VTI:  0.19 m                             Systemic Diam: 1.70 cm Vishnu Priya Mallipeddi Electronically signed by Diannah Late Mallipeddi Signature Date/Time: 03/30/2024/2:08:16 PM    Final    DG Chest Port 1 View Result Date: 03/29/2024 EXAM: 1 VIEW(S) XRAY OF THE CHEST 03/29/2024 07:24:20 PM COMPARISON: 03/15/2007 CLINICAL HISTORY: dyspnea dyspnea FINDINGS: LUNGS AND PLEURA: No focal pulmonary opacity. No pulmonary edema. No pleural effusion. No pneumothorax. HEART AND MEDIASTINUM: Mild cardiomegaly. Large  hiatal hernia. BONES AND SOFT TISSUES: No acute osseous abnormality. IMPRESSION: 1. No acute cardiopulmonary abnormality. 2. Mild cardiomegaly. 3. Hiatal hernia. Electronically signed by: Franky Crease MD 03/29/2024 07:27 PM EDT RP Workstation: [id number]      Subjective:   Discharge Exam: Vitals:   04/01/24 2233 04/02/24 0443  BP:  118/79 (!) 160/94  Pulse: 87 75  Resp:  18  Temp:  98.3 F (36.8 C)  SpO2:  96%    General: Pt is alert, awake, not in acute distress Cardiovascular: rate controlled, S1/S2 + Respiratory: bilateral decreased breath sounds at bases Abdominal: Soft, NT, ND, bowel sounds + Extremities: no edema, no cyanosis    The results of significant diagnostics from this hospitalization (including imaging, microbiology, ancillary and laboratory) are listed below for reference.     Microbiology: Recent Results (from the past 240 hours)  Urine Culture     Status: Abnormal   Collection Time: 03/29/24  7:58 PM   Specimen: Urine, Clean  Catch  Result Value Ref Range Status   Specimen Description   Final    URINE, CLEAN CATCH Performed at [facility], [address], [place], [place] [address]    Special Requests   Final    NONE Performed at [facility], [address] [place], [place] [address]    Culture >=100,000 COLONIES/mL ESCHERICHIA COLI (A)  Final   Report Status 04/01/2024 FINAL  Final   Organism ID, Bacteria ESCHERICHIA COLI (A)  Final      Susceptibility   Escherichia coli - MIC*    AMPICILLIN <=2 SENSITIVE Sensitive     CEFAZOLIN (URINE) Value in next row Sensitive      <=1 SENSITIVEThis is a modified FDA-approved test that has been validated and its performance characteristics determined by the reporting laboratory.  This laboratory is certified under the Clinical Laboratory Improvement Amendments CLIA as qualified to perform high complexity clinical laboratory testing.    CEFEPIME Value in next row Sensitive      <=1 SENSITIVEThis is a modified FDA-approved test that has been validated and its performance characteristics determined by the reporting laboratory.  This laboratory is certified under the Clinical Laboratory Improvement Amendments CLIA as qualified to perform high complexity clinical laboratory testing.    ERTAPENEM Value in next row Sensitive      <=1 SENSITIVEThis is  a modified FDA-approved test that has been validated and its performance characteristics determined by the reporting laboratory.  This laboratory is certified under the Clinical Laboratory Improvement Amendments CLIA as qualified to perform high complexity clinical laboratory testing.    CEFTRIAXONE  Value in next row Sensitive      <=1 SENSITIVEThis is a modified FDA-approved test that has been validated and its performance characteristics determined by the reporting laboratory.  This laboratory is certified under the Clinical Laboratory Improvement Amendments CLIA as qualified to perform high complexity clinical laboratory testing.    CIPROFLOXACIN  Value in next row Sensitive      <=1 SENSITIVEThis is a modified FDA-approved test that has been validated and its performance characteristics determined by the reporting laboratory.  This laboratory is certified under the Clinical Laboratory Improvement Amendments CLIA as qualified to perform high complexity clinical laboratory testing.    GENTAMICIN Value in next row Sensitive      <=1 SENSITIVEThis is a modified FDA-approved test that has been validated and its performance characteristics determined by the reporting laboratory.  This laboratory is certified under the Clinical Laboratory Improvement Amendments CLIA as qualified to perform high complexity clinical laboratory testing.    NITROFURANTOIN Value in next row Sensitive      <=1 SENSITIVEThis is a modified FDA-approved test that has been validated and its performance characteristics determined by the reporting laboratory.  This laboratory is certified under the Clinical Laboratory Improvement Amendments CLIA as qualified to perform high complexity clinical laboratory testing.    TRIMETH/SULFA Value in next row Sensitive      <=1 SENSITIVEThis is a modified FDA-approved test that has been validated and its performance characteristics determined by the reporting laboratory.  This laboratory is certified  under the Clinical Laboratory Improvement Amendments CLIA as qualified to perform high complexity clinical laboratory testing.    AMPICILLIN/SULBACTAM Value in next row Sensitive      <=1 SENSITIVEThis is a modified FDA-approved test that has been validated and its performance characteristics determined by the reporting laboratory.  This laboratory is certified under the Clinical Laboratory Improvement Amendments CLIA as qualified to perform high complexity clinical laboratory  testing.    PIP/TAZO Value in next row Sensitive      <=4 SENSITIVEThis is a modified FDA-approved test that has been validated and its performance characteristics determined by the reporting laboratory.  This laboratory is certified under the Clinical Laboratory Improvement Amendments CLIA as qualified to perform high complexity clinical laboratory testing.    MEROPENEM Value in next row Sensitive      <=4 SENSITIVEThis is a modified FDA-approved test that has been validated and its performance characteristics determined by the reporting laboratory.  This laboratory is certified under the Clinical Laboratory Improvement Amendments CLIA as qualified to perform high complexity clinical laboratory testing.    * >=100,000 COLONIES/mL ESCHERICHIA COLI     Labs: BNP (last 3 results) No results for input(s): BNP in the last 8760 hours. Basic Metabolic Panel: Recent Labs  Lab 03/29/24 1841 03/30/24 0310 04/01/24 1035  NA 138 142 139  K 4.3 3.9 3.9  CL 103 108 105  CO2 25 26 23   GLUCOSE 112* 93 87  BUN 22 18 13   CREATININE 0.86 0.77 0.73  CALCIUM 9.1 8.5* 9.0  MG 2.5* 2.4 2.4  PHOS  --  3.6  --    Liver Function Tests: Recent Labs  Lab 03/29/24 1841 03/30/24 0310  AST 26 18  ALT 29 24  ALKPHOS 98 82  BILITOT 0.4 0.5  PROT 6.7 5.7*  ALBUMIN 4.1 3.7   No results for input(s): LIPASE, AMYLASE in the last 168 hours. No results for input(s): AMMONIA in the last 168 hours. CBC: Recent Labs  Lab  03/29/24 1841 03/30/24 0310 03/31/24 0503 04/01/24 1035  WBC 8.0 7.1  --  6.7  NEUTROABS 5.9  --   --  5.4  HGB 8.9* 8.4* 8.8* 9.7*  HCT 28.9* 27.0* 29.0* 32.8*  MCV 92.3 91.2  --  93.4  PLT 267 258  --  286   Cardiac Enzymes: No results for input(s): CKTOTAL, CKMB, CKMBINDEX, TROPONINI in the last 168 hours. BNP: Invalid input(s): POCBNP CBG: No results for input(s): GLUCAP in the last 168 hours. D-Dimer No results for input(s): DDIMER in the last 72 hours. Hgb A1c No results for input(s): HGBA1C in the last 72 hours. Lipid Profile No results for input(s): CHOL, HDL, LDLCALC, TRIG, CHOLHDL, LDLDIRECT in the last 72 hours. Thyroid  function studies No results for input(s): TSH, T4TOTAL, T3FREE, THYROIDAB in the last 72 hours.  Invalid input(s): FREET3 Anemia work up Recent Labs    03/31/24 0503  FERRITIN 16  TIBC 406  IRON 22*   Urinalysis    Component Value Date/Time   COLORURINE YELLOW 03/29/2024 1955   APPEARANCEUR CLOUDY (A) 03/29/2024 1955   LABSPEC 1.006 03/29/2024 1955   PHURINE 6.0 03/29/2024 1955   GLUCOSEU NEGATIVE 03/29/2024 1955   HGBUR LARGE (A) 03/29/2024 1955   BILIRUBINUR NEGATIVE 03/29/2024 1955   KETONESUR NEGATIVE 03/29/2024 1955   PROTEINUR NEGATIVE 03/29/2024 1955   NITRITE POSITIVE (A) 03/29/2024 1955   LEUKOCYTESUR MODERATE (A) 03/29/2024 1955   Sepsis Labs Recent Labs  Lab 03/29/24 1841 03/30/24 0310 04/01/24 1035  WBC 8.0 7.1 6.7   Microbiology Recent Results (from the past 240 hours)  Urine Culture     Status: Abnormal   Collection Time: 03/29/24  7:58 PM   Specimen: Urine, Clean Catch  Result Value Ref Range Status   Specimen Description   Final    URINE, CLEAN CATCH Performed at [facility], [address] [place], [place] [address]  Special Requests   Final    NONE Performed at [facility], [address] [place], [place] [address]    Culture >=100,000 COLONIES/mL ESCHERICHIA  COLI (A)  Final   Report Status 04/01/2024 FINAL  Final   Organism ID, Bacteria ESCHERICHIA COLI (A)  Final      Susceptibility   Escherichia coli - MIC*    AMPICILLIN <=2 SENSITIVE Sensitive     CEFAZOLIN (URINE) Value in next row Sensitive      <=1 SENSITIVEThis is a modified FDA-approved test that has been validated and its performance characteristics determined by the reporting laboratory.  This laboratory is certified under the Clinical Laboratory Improvement Amendments CLIA as qualified to perform high complexity clinical laboratory testing.    CEFEPIME Value in next row Sensitive      <=1 SENSITIVEThis is a modified FDA-approved test that has been validated and its performance characteristics determined by the reporting laboratory.  This laboratory is certified under the Clinical Laboratory Improvement Amendments CLIA as qualified to perform high complexity clinical laboratory testing.    ERTAPENEM Value in next row Sensitive      <=1 SENSITIVEThis is a modified FDA-approved test that has been validated and its performance characteristics determined by the reporting laboratory.  This laboratory is certified under the Clinical Laboratory Improvement Amendments CLIA as qualified to perform high complexity clinical laboratory testing.    CEFTRIAXONE  Value in next row Sensitive      <=1 SENSITIVEThis is a modified FDA-approved test that has been validated and its performance characteristics determined by the reporting laboratory.  This laboratory is certified under the Clinical Laboratory Improvement Amendments CLIA as qualified to perform high complexity clinical laboratory testing.    CIPROFLOXACIN  Value in next row Sensitive      <=1 SENSITIVEThis is a modified FDA-approved test that has been validated and its performance characteristics determined by the reporting laboratory.  This laboratory is certified under the Clinical Laboratory Improvement Amendments CLIA as qualified to perform high  complexity clinical laboratory testing.    GENTAMICIN Value in next row Sensitive      <=1 SENSITIVEThis is a modified FDA-approved test that has been validated and its performance characteristics determined by the reporting laboratory.  This laboratory is certified under the Clinical Laboratory Improvement Amendments CLIA as qualified to perform high complexity clinical laboratory testing.    NITROFURANTOIN Value in next row Sensitive      <=1 SENSITIVEThis is a modified FDA-approved test that has been validated and its performance characteristics determined by the reporting laboratory.  This laboratory is certified under the Clinical Laboratory Improvement Amendments CLIA as qualified to perform high complexity clinical laboratory testing.    TRIMETH/SULFA Value in next row Sensitive      <=1 SENSITIVEThis is a modified FDA-approved test that has been validated and its performance characteristics determined by the reporting laboratory.  This laboratory is certified under the Clinical Laboratory Improvement Amendments CLIA as qualified to perform high complexity clinical laboratory testing.    AMPICILLIN/SULBACTAM Value in next row Sensitive      <=1 SENSITIVEThis is a modified FDA-approved test that has been validated and its performance characteristics determined by the reporting laboratory.  This laboratory is certified under the Clinical Laboratory Improvement Amendments CLIA as qualified to perform high complexity clinical laboratory testing.    PIP/TAZO Value in next row Sensitive      <=4 SENSITIVEThis is a modified FDA-approved test that has been validated and its performance characteristics determined by the reporting  laboratory.  This laboratory is certified under the Clinical Laboratory Improvement Amendments CLIA as qualified to perform high complexity clinical laboratory testing.    MEROPENEM Value in next row Sensitive      <=4 SENSITIVEThis is a modified FDA-approved test that has been  validated and its performance characteristics determined by the reporting laboratory.  This laboratory is certified under the Clinical Laboratory Improvement Amendments CLIA as qualified to perform high complexity clinical laboratory testing.    * >=100,000 COLONIES/mL ESCHERICHIA COLI     Time coordinating discharge: 35 minutes  SIGNED:   Derryl Duval, MD  [facility] 04/02/2024, 1:28 PM

## 2024-04-03 ENCOUNTER — Encounter (HOSPITAL_COMMUNITY): Payer: Self-pay | Admitting: Internal Medicine

## 2024-04-03 LAB — SURGICAL PATHOLOGY

## 2024-04-16 ENCOUNTER — Ambulatory Visit: Payer: Self-pay

## 2024-04-16 ENCOUNTER — Ambulatory Visit: Admitting: Student in an Organized Health Care Education/Training Program

## 2024-04-16 ENCOUNTER — Encounter: Payer: Self-pay | Admitting: Student in an Organized Health Care Education/Training Program

## 2024-04-16 VITALS — BP 183/123 | HR 82 | Wt 179.0 lb

## 2024-04-16 DIAGNOSIS — I10 Essential (primary) hypertension: Secondary | ICD-10-CM | POA: Diagnosis not present

## 2024-04-16 DIAGNOSIS — I4819 Other persistent atrial fibrillation: Secondary | ICD-10-CM | POA: Diagnosis not present

## 2024-04-16 DIAGNOSIS — I872 Venous insufficiency (chronic) (peripheral): Secondary | ICD-10-CM | POA: Diagnosis not present

## 2024-04-16 DIAGNOSIS — D500 Iron deficiency anemia secondary to blood loss (chronic): Secondary | ICD-10-CM | POA: Diagnosis not present

## 2024-04-16 MED ORDER — METOPROLOL SUCCINATE ER 50 MG PO TB24: 50.0000 mg | ORAL_TABLET | Freq: Every day | ORAL | 1 refills | Status: AC

## 2024-04-16 MED ORDER — LOSARTAN POTASSIUM-HCTZ 100-25 MG PO TABS: 1.0000 | ORAL_TABLET | Freq: Every day | ORAL | 1 refills | Status: AC

## 2024-04-16 NOTE — Telephone Encounter (Signed)
 Reason for Disposition  Caller has medicine question only, adult not sick, AND triager answers question    Triager spent extensive time review current medication list and reviewing a sample schedule with pt.  Answer Assessment - Initial Assessment Questions 1. NAME of MEDICINE: What medicine(s) are you calling about?     metoprolol succinate (TOPROL-XL) 50 MG losartan-hydrochlorothiazide (HYZAAR) 100-25 MG tablet 2. QUESTION: What is your question? (e.g., double dose of medicine, side effect)     Pt unsure when to start new Rx. Triager reinforced new medication 3. PRESCRIBER: Who prescribed the medicine? Reason: if prescribed by specialist, call should be referred to that group.     PCP 4. SYMPTOMS: Do you have any symptoms? If Yes, ask: What symptoms are you having?  How bad are the symptoms (e.g., mild, moderate, severe)     N/a 5. PREGNANCY:  Is there any chance that you are pregnant? When was your last menstrual period?     N/a  Protocols used: Medication Question Call-A-AH

## 2024-04-16 NOTE — Patient Instructions (Signed)
  VISIT SUMMARY: During your visit, we discussed your recent hospitalization for high blood pressure and atrial fibrillation, as well as your ongoing symptoms of fatigue and dizziness. We reviewed your current medications and made some adjustments to better manage your conditions.  YOUR PLAN: -ATRIAL FIBRILLATION: Atrial fibrillation is an irregular and often rapid heart rate that can increase your risk of strokes, heart failure, and other heart-related complications. Continue taking metoprolol to control your heart rate, and we will add a combination antihypertensive medication to help manage your blood pressure.  -HYPERTENSION: Hypertension, or high blood pressure, can lead to serious health problems if not managed properly. We will add a combination antihypertensive medication to your current regimen with lisinopril to better control your blood pressure.  -IRON DEFICIENCY ANEMIA: Iron deficiency anemia occurs when your body doesn't have enough iron to produce adequate levels of hemoglobin, leading to fatigue and weakness. Continue taking your iron supplement, and we will recheck your blood counts and iron levels in four weeks.  -GASTRIC EROSIONS AND GASTRITIS: Gastric erosions and gastritis involve inflammation and damage to the stomach lining, which can cause chronic blood loss and contribute to anemia. Continue taking your stomach medication to reduce inflammation and promote healing. We will re-evaluate in four weeks to check for improvement.  -CHRONIC VENOUS INSUFFICIENCY WITH LOWER EXTREMITY EDEMA: Chronic venous insufficiency occurs when your leg veins don't allow blood to flow back up to your heart properly, causing swelling and discomfort. Continue using support hose to help manage the swelling.  INSTRUCTIONS: Please follow up in four weeks for a re-evaluation of your blood counts, iron levels, and the healing of your gastric erosions. Continue taking your medications as prescribed and  monitor your blood pressure regularly. If you experience any new or worsening symptoms, contact our office immediately.

## 2024-04-16 NOTE — Assessment & Plan Note (Signed)
 New issue as of last hospitalization.  Likely related to advanced age, longstanding hypertension, and recent iron deficiency anemia.  Echocardiogram showed normal LVEF but severely dilated left and right atrium.  This raises the risk of permanent atrial fibrillation.  Doing pretty well on metoprolol tartrate, but struggling with medication adherence.  Will switch to metoprolol succinate in order to have a easier once daily adherence.  She reports doing well on apixaban 5 mg twice daily for primary prevention of cerebral infarction.  We are working to better control her hypertension for safety.  Will continue with anticoagulation given her increased risk of stroke.

## 2024-04-16 NOTE — Progress Notes (Signed)
 New Patient Office Visit  Patient ID: Jody Hernandez, Female   DOB: Jun 13, 1950 73 y.o. MRN: [id number]  Chief Complaint  Patient presents with   Hospitalization Follow-up    Patient was seen on 03/29/2024 at [facility]  Was recommended to follow up with PCP and Repeat CBC/BMP as well    Establish Care   Subjective:     Jody Hernandez presents to establish care  HPI  Discussed the use of AI scribe software for clinical note transcription with the patient, who gave verbal consent to proceed.  History of Present Illness Jody Hernandez is a 73 year old female with atrial fibrillation who presents with fatigue and dizziness.  She was recently hospitalized and discharged on November 4th due to high blood pressure and atrial fibrillation. During this time, she experienced dizziness, especially when bending over, and her heart was racing. She was taken to urgent care where an ambulance was called due to her condition.  Since returning home, she experiences persistent fatigue and difficulty walking more than 100 to 200 feet without feeling tired. She describes feeling 'real tired' and unable to walk far without needing to rest.  Her medication regimen includes metoprolol, which she took at 8 AM, and an iron supplement, which she takes with breakfast to avoid nausea. She also takes a stomach medication with breakfast. She reports confusion with her medication schedule and relies on her daughter for assistance.  She uses support hose, although they were damaged by her daughter's dog. No history of heart attack or stroke.   Outpatient Encounter Medications as of 04/16/2024  Medication Sig   apixaban (ELIQUIS) 5 MG TABS tablet Take 1 tablet (5 mg total) by mouth 2 (two) times daily.   ferrous sulfate 325 (65 FE) MG tablet Take 1 tablet (325 mg total) by mouth daily with breakfast.   losartan-hydrochlorothiazide (HYZAAR) 100-25 MG tablet Take 1 tablet by mouth daily.   metoprolol  succinate (TOPROL-XL) 50 MG 24 hr tablet Take 1 tablet (50 mg total) by mouth daily. Take with or immediately following a meal.   pantoprazole (PROTONIX) 40 MG tablet Take 1 tablet (40 mg total) by mouth 2 (two) times daily before a meal.   [DISCONTINUED] cetirizine (ZYRTEC) 10 MG tablet Take 10 mg by mouth daily as needed for allergies or rhinitis.   [DISCONTINUED] metoprolol tartrate (LOPRESSOR) 25 MG tablet Take 1 tablet (25 mg total) by mouth 2 (two) times daily.   [DISCONTINUED] amoxicillin-clavulanate (AUGMENTIN) 875-125 MG tablet Take 1 tablet by mouth every 12 (twelve) hours. (Patient not taking: Reported on 04/16/2024)   [DISCONTINUED] guaifenesin (ROBITUSSIN) 100 MG/5ML syrup Take 200 mg by mouth 3 (three) times daily as needed for cough or congestion. (Patient not taking: Reported on 04/16/2024)   [DISCONTINUED] lisinopril (ZESTRIL) 40 MG tablet Take 40 mg by mouth at bedtime. (Patient not taking: Reported on 04/16/2024)   No facility-administered encounter medications on file as of 04/16/2024.    Past Medical History:  Diagnosis Date   Hypertension    Urinary tract obstruction by kidney stone 03/08/2017    Past Surgical History:  Procedure Laterality Date   BREAST SURGERY     lumpectomy   COLONOSCOPY N/A 04/01/2024   Procedure: COLONOSCOPY;  Surgeon: Cindie Carlin POUR, DO;  Location: [facility];  Service: Endoscopy;  Laterality: N/A;   CYSTOSCOPY WITH STENT PLACEMENT Left 03/09/2017   Procedure: CYSTOSCOPY, RETROGRADE PYELOGRAM WITH STENT PLACEMENT;  Surgeon: Gaston Hamilton, MD;  Location: [facility];  Service: Urology;  Laterality: Left;   ESOPHAGOGASTRODUODENOSCOPY N/A 04/01/2024   Procedure: EGD (ESOPHAGOGASTRODUODENOSCOPY);  Surgeon: Cindie Carlin POUR, DO;  Location: [facility];  Service: Endoscopy;  Laterality: N/A;    Family History  Problem Relation Age of Onset   Hypertension Mother    Ulcers Father    Kidney cancer Neg Hx     Social History    Socioeconomic History   Marital status: Married    Spouse name: Not on file   Number of children: Not on file   Years of education: Not on file   Highest education level: Not on file  Occupational History   Not on file  Tobacco Use   Smoking status: Never   Smokeless tobacco: Never  Vaping Use   Vaping status: Never Used  Substance and Sexual Activity   Alcohol use: No   Drug use: No   Sexual activity: Not Currently    Birth control/protection: None  Other Topics Concern   Not on file  Social History Narrative   Not on file   Social Drivers of Health   Financial Resource Strain: Not on file  Food Insecurity: No Food Insecurity (03/29/2024)   Hunger Vital Sign    Worried About Running Out of Food in the Last Year: Never true    Ran Out of Food in the Last Year: Never true  Transportation Needs: No Transportation Needs (03/29/2024)   PRAPARE - Administrator, Civil Service (Medical): No    Lack of Transportation (Non-Medical): No  Physical Activity: Not on file  Stress: Not on file  Social Connections: Moderately Integrated (03/29/2024)   Social Connection and Isolation Panel    Frequency of Communication with Friends and Family: More than three times a week    Frequency of Social Gatherings with Friends and Family: Once a week    Attends Religious Services: 1 to 4 times per year    Active Member of [employer] or Organizations: No    Attends Banker Meetings: Never    Marital Status: Married  Catering Manager Violence: Unknown (03/30/2024)   Humiliation, Afraid, Rape, and Kick questionnaire    Fear of Current or Ex-Partner: Not on file    Emotionally Abused: Not on file    Physically Abused: Not on file    Sexually Abused: No     Objective:    BP (!) 183/123   Pulse 82   Wt 179 lb (81.2 kg)   BMI 32.74 kg/m   Physical Exam  Gen: Well-appearing woman Ears: Normal tympanic membranes bilaterally Neck: Normal thyroid , no nodules or  adenopathy Heart: Irregular rhythm, no murmur Lungs: Unlabored, clear throughout Abd: Soft and nontender Ext: Warm, chronic 1+ lower extremity edema in both legs with chronic stasis changes of the skin Neuro: Alert, conversational, full strength upper and lower extremities, normal gait and balance Psych: Very pleasant, not anxious or depressed appearing      Assessment & Plan:   Problem List Items Addressed This Visit       High   Hypertension (Chronic)   Chronic severe asymptomatic hypertension.  Blood pressure not well-controlled today.  She has a lot of confusion about her current medications.  Says she is taking lisinopril 40 mg daily.  Because of the lower extremity edema will avoid amlodipine.  I am going to switch her from lisinopril to Hyzaar 100-25 mg daily.  Follow-up in 4 weeks for blood pressure recheck along with a BMP.  Relevant Medications   metoprolol succinate (TOPROL-XL) 50 MG 24 hr tablet   losartan-hydrochlorothiazide (HYZAAR) 100-25 MG tablet   Atrial fibrillation (HCC) - Primary (Chronic)   New issue as of last hospitalization.  Likely related to advanced age, longstanding hypertension, and recent iron deficiency anemia.  Echocardiogram showed normal LVEF but severely dilated left and right atrium.  This raises the risk of permanent atrial fibrillation.  Doing pretty well on metoprolol tartrate, but struggling with medication adherence.  Will switch to metoprolol succinate in order to have a easier once daily adherence.  She reports doing well on apixaban 5 mg twice daily for primary prevention of cerebral infarction.  We are working to better control her hypertension for safety.  Will continue with anticoagulation given her increased risk of stroke.      Relevant Medications   metoprolol succinate (TOPROL-XL) 50 MG 24 hr tablet   losartan-hydrochlorothiazide (HYZAAR) 100-25 MG tablet     Medium    Iron deficiency anemia due to chronic blood loss (Chronic)    New issue from last hospitalization.  Likely due to erosive gastropathy and Ole lesions from a hiatal hernia.  Also had a polyp removed from her colon.  Doing well on oral ferrous sulfate once daily.  Will plan to recheck blood counts and iron levels in 4 weeks.  If she remains iron deficient, will arrange for IV iron infusion.          Low   Chronic venous insufficiency (Chronic)   Chronic lower extremity edema with chronic venous stasis changes mostly in the left leg.  We talked about compression stocking as supportive therapy.      Relevant Medications   metoprolol succinate (TOPROL-XL) 50 MG 24 hr tablet   losartan-hydrochlorothiazide (HYZAAR) 100-25 MG tablet    Return in about 4 weeks (around 05/14/2024) for HTN management.   Cleatus Debby Specking, MD [facility]

## 2024-04-16 NOTE — Telephone Encounter (Signed)
 FYI   Patient is still not understanding medication instructions and when to start and take medications after instructing patient multiple times in office and now over triage. I did give medication list to patient as well in office and highlighted everything even instructions on the medication list. I did inform patient to start new medications today if able or tomorrow.   Please advise what we can do to help further?

## 2024-04-16 NOTE — Assessment & Plan Note (Signed)
 Chronic severe asymptomatic hypertension.  Blood pressure not well-controlled today.  She has a lot of confusion about her current medications.  Says she is taking lisinopril 40 mg daily.  Because of the lower extremity edema will avoid amlodipine.  I am going to switch her from lisinopril to Hyzaar 100-25 mg daily.  Follow-up in 4 weeks for blood pressure recheck along with a BMP.

## 2024-04-16 NOTE — Assessment & Plan Note (Signed)
 Chronic lower extremity edema with chronic venous stasis changes mostly in the left leg.  We talked about compression stocking as supportive therapy.

## 2024-04-16 NOTE — Assessment & Plan Note (Signed)
 New issue from last hospitalization.  Likely due to erosive gastropathy and Ole lesions from a hiatal hernia.  Also had a polyp removed from her colon.  Doing well on oral ferrous sulfate once daily.  Will plan to recheck blood counts and iron levels in 4 weeks.  If she remains iron deficient, will arrange for IV iron infusion.

## 2024-04-17 NOTE — Telephone Encounter (Signed)
 I recommend she bring her medicine bottles into her next visit so we can go over them together.  I also recommend she start using a journalist, newspaper and perhaps enlist the help of family to organize her weekly pills according to the medication list.

## 2024-04-18 NOTE — Telephone Encounter (Signed)
 Called pt and phone hung up. Please advise pt if she calls back

## 2024-04-19 ENCOUNTER — Ambulatory Visit: Payer: Self-pay

## 2024-04-19 NOTE — Telephone Encounter (Signed)
 FYI Only or Action Required?: Action required by provider: update on patient condition.  Patient was last seen in primary care on 04/16/2024 by Jerrell Cleatus Ned, MD.  Called Nurse Triage reporting Stool Color Change.  Symptoms began 2 days ago.  Interventions attempted: Nothing.  Symptoms are: intermittent.  Triage Disposition: Home Care  Patient/caregiver understands and will follow disposition?: Yes      Copied from CRM [id number]. Topic: Clinical - Red Word Triage >> Apr 19, 2024  1:07 PM Thersia BROCKS wrote: [facility] that prompted transfer to Nurse Triage: Patient has been in the hospital has been taking iron pills, patient stated she has noticed that her stool is very dark, patient wants to know if that's from the iron pill or do she needs to see a doctor regarding this        Reason for Disposition  Unusual stool color probably from food or medicine  Answer Assessment - Initial Assessment Questions Patient reports she has had 2 episodes of dark stool in total. She denies any other symptoms. Patient started taking iron pills on 11/5.    1. COLOR: What color is your stool? Is that color in part or all of the stool? (e.g., black, clay-colored, green, red)      Dark colored  2. ONSET: When did you first notice this color change?     2 days ago  3. CAUSE: Have you eaten any food or taken any medicine of this color? Note: See listing in Background Information section.      Unsure if due to Iron pills which she started taking on 11/5 4. OTHER SYMPTOMS: Do you have any other symptoms? (e.g., abdomen pain, diarrhea, fever, yellow eyes or skin).     No  Protocols used: Stools - Unusual Color-A-AH

## 2024-04-19 NOTE — Telephone Encounter (Signed)
 Yes.  It is common for oral iron to cause dark color changes in the stool.

## 2024-04-19 NOTE — Telephone Encounter (Signed)
 Patient is questioning if dark stolls can be coming from the iron pills or if she should come in to be seem. Not having any other symptoms

## 2024-04-22 NOTE — Telephone Encounter (Signed)
 Called patient to relay message below. Not able to leave VM to return call

## 2024-04-23 NOTE — Telephone Encounter (Signed)
 Called 2x -unable to leave vm.  If pt calls back please relay message

## 2024-04-30 LAB — HM COLONOSCOPY

## 2024-04-30 NOTE — Progress Notes (Unsigned)
 Cardiology Office Note:  .   Date:  05/01/2024  ID:  Rock LELON Doing, DOB [birth year], MRN [id number] PCP: Jerrell Cleatus Ned, MD  [facility] Providers Cardiologist:  Jayson Sierras, MD {  History of Present Illness: .   KELEIGH KAZEE is a 73 y.o. female  with PMHx of HTN, newly diagnosed Afib on 04/01/2024 in the setting of iron deficiency and UTI with culture positive E. Coli, Valvular heart disease (ECHO 03/2024: mild mitral regurgitation, moderate TV regurgitation) who reports to [facility] office for follow up.   First seen by heartcare in hospital consult on 04/01/2024 by Dr. Sierras for newly documented afib with RVR in the setting of heme positive stool with iron deficiency anemia (Hgb 8.8) and UTI with culture positive for E.coli. Reported Iightheadness, palpitations, and dark stool. HR initially controlled on low-dose Lopressor  but had intermittent bursts of RVR when sitting upright, which resolved with repositioning. Echo showed LVEF 60 to 65% with mild LVH, mildly enlarged RV, severe biatrial enlargement, small posterior pericardial effusion, mild mitral regurgitation, moderate TV regurgitation and dilated IVC. CHA2DS2-VASc score is 4. GI was consulted. EGD/colonoscopy revealed large hiatal hernia with multiple Cameron ulcers, diffuse gastric inflammation, and a 10 mm duodenal polyp; no active bleeding. GI cleared her to start anticoagulation. Discontinue Naproxen. Discharged on Lopressor  25 mg BID, Eliquis  5 mg BID, Ferrous sulfate  325 mg daily, Protonix  40 mg BID, Augmentin  875-125 mg BID. Continued on Lisinopril  20 mg daily.  Plan at discharge included possible DCCV if Afib persists on outpatient follow-up.  Today, reports occasional lightheadedness with walking.  Notes resolution of palpitations and dark stools. Denies chest pain, shortness of breath, syncope, presyncope, dizziness, orthopnea, PND, swelling or significant weight changes, acute bleeding, or claudication. Reports  compliance with medications.  She followed up with GI this morning.  Per chart review, recommended repeat colonoscopy with extended bowel prep and order repeat labs today.  Patient is able to walk in the grocery store, wash dishes, vacuuming and do yard work without any concerns.  ROS: 10 point review of system has been reviewed and considered negative except ones been listed in the HPI.   Studies Reviewed: SABRA   EKG Interpretation Date/Time:  Wednesday May 01 2024 14:30:58 EST Ventricular Rate:  78 PR Interval:    QRS Duration:  78 QT Interval:  376 QTC Calculation: 428 R Axis:   125  Text Interpretation: Atrial fibrillation Right axis deviation Nonspecific ST abnormality When compared with ECG of 01-Apr-2024 09:47, QRS axis Shifted right Criteria for Septal infarct are no longer Present Confirmed by Sheron Hallmark ([id number]) on 05/01/2024 2:33:08 PM   ECHO IMPRESSIONS 03/30/2024  1. Left ventricular ejection fraction, by estimation, is 60 to 65%. The left ventricle has normal function. The left ventricle has no regional wall motion abnormalities. There is mild left ventricular hypertrophy of the septal segment. Left ventricular diastolic function could not be evaluated.   2. Right ventricular systolic function is normal. The right ventricular size is mildly enlarged. There is normal pulmonary artery systolic pressure.   3. Left atrial size was severely dilated.   4. Right atrial size was severely dilated.   5. A small pericardial effusion is present. The pericardial effusion is posterior to the left ventricle.   6. The mitral valve is normal in structure. Mild mitral valve  regurgitation. No evidence of mitral stenosis.   7. The tricuspid valve is abnormal. Tricuspid valve regurgitation is moderate.   8. The aortic valve is tricuspid.  Aortic valve regurgitation is not  visualized. No aortic stenosis is present.   9. The inferior vena cava is dilated in size with >50% respiratory  variability, suggesting right atrial pressure of 8 mmHg.   Comparison(s): No prior Echocardiogram.   Physical Exam:   VS:  BP 112/74 (BP Location: Left Arm, Cuff Size: Large)   Pulse 65   Ht 5' 1 (1.549 m)   Wt 168 lb (76.2 kg)   SpO2 100%   BMI 31.74 kg/m    Wt Readings from Last 3 Encounters:  05/01/24 168 lb (76.2 kg)  05/01/24 166 lb 11.2 oz (75.6 kg)  04/16/24 179 lb (81.2 kg)    GEN: Well nourished, well developed in no acute distress while sitting in chair.  NECK: No JVD; No carotid bruits CARDIAC: irreg irreg, no murmurs, rubs, gallops RESPIRATORY:  Clear to auscultation without rales, wheezing or rhonchi  ABDOMEN: Soft, non-tender, non-distended EXTREMITIES:  No edema; No deformity   ASSESSMENT AND PLAN: .   Paroxysmal atrial fibrillation (HCC) Afib occurred in the setting of acute metabolic stress: iron deficiency anemia, Cameron ulcers, and UTI. EKG today shows controlled A-fib On exam noted irregularly irregular.  Report lightheaded occasional with walking. Denies palpitations, dizziness, or chest discomfort. No recurrent dark stools or bleeding. On AC with most recent CBC 04/01/2024 with Hgb 9.7. Continue Metoprolol  tartrate 25 mg BID and Eliquis  5 mg BID given CHA?DS?-VASc = 4.  Continue to monitor CBC given history of GI blood loss and initiation of AC.  Discussed DCCV, however patient declines at this time and would like to research further before deciding.  Also discussed alternative option of amiodarone, which patient will also research. If she decides to proceed with DCCV then would need to wait until after colonoscopy and only 3 to 4 weeks uninterrupted AC. Educated on signs of bleeding, when to seek urgent care.  Primary hypertension Per chart review, PCP switched Lisinopril  to losartan  HCTZ 100-25 mg daily on 04/16/2024 BP this OV well controlled today: 112/74 Continue on losartan  HCTZ 100-25 mg daily and Lopressor  as above. Encourage physical activity  for 150 minutes per week or as best tolerated and heart healthy low sodium diet. Discussed limiting sodium intake to < 2 grams daily.     Valvular heart disease Echo 03/2024 showed LVEF 60 to 65% with mild LVH, mildly enlarged RV, severe biatrial enlargement, small posterior pericardial effusion, mild mitral regurgitation, moderate TV regurgitation and dilated IVC.  Continue GDMT of contributing factors: BP control, rate control, Afib management. Plan to repeat echocardiogram in 03/2025 or sooner if symptoms worsen (lower extremity edema, worsening dyspnea, fatigue). Monitor volume status given TR and dilated IVC.  Iron deficiency anemia due to chronic blood loss Hospitalization 03/2024: heme positive stool with iron deficiency anemia (Hgb 8.8). EGD/colonoscopy revealed large hiatal hernia with multiple Cameron ulcers, diffuse gastric inflammation, and a 10 mm duodenal polyp; no active bleeding. Discharged on iron supplementation and PPI. Seen today in follow-up with GI.  Per chart review, recommended repeat colonoscopy with extended bowel prep and order repeat labs today that are still pending for CBC, iron panel, and ferritin  Continue ferrous sulfate  325 mg daily and Protonix  40 mg BID Reinforce avoiding NSAIDs (e.g., naproxen). Ensure GI follow-up for ulcer management and duodenal polyp pathology.  Preoperative Cardiovascular Risk Assessment What type of surgery is being performed? Colonoscopy When is surgery scheduled? To be determined What type of clearance is required (medical or pharmacy to hold medication or both? Medication  Are there any medications that need to be held prior to surgery and how long? Eliquis , hold 2 days prior Name of physician performing surgery?  Dr. Cinderella Rouse [facility]astroenterology at [facility] Phone: [phone] Fax: [phone] Anethesia type (none, local, MAC, general)? MAC Revised Cardiac Risk Index (RCRI) with 0 point and perioperative risk of major  cardiac events is 0.4%.  Duke Activity Status Index (DASI) with > 4 mets .  From cardiac standpoint, this patient is at acceptable risk for the planned procedure without further cardiovascular testing. Will confirm hold time for Eliquis  with pharmacy. Once confirmed, will send update.     Dispo: Follow up in 3 months with me.   Signed, Lorette CINDERELLA Kapur, PA-C

## 2024-05-01 ENCOUNTER — Telehealth (INDEPENDENT_AMBULATORY_CARE_PROVIDER_SITE_OTHER): Payer: Self-pay

## 2024-05-01 ENCOUNTER — Ambulatory Visit (INDEPENDENT_AMBULATORY_CARE_PROVIDER_SITE_OTHER): Admitting: Gastroenterology

## 2024-05-01 ENCOUNTER — Encounter (INDEPENDENT_AMBULATORY_CARE_PROVIDER_SITE_OTHER): Payer: Self-pay | Admitting: *Deleted

## 2024-05-01 ENCOUNTER — Encounter (INDEPENDENT_AMBULATORY_CARE_PROVIDER_SITE_OTHER): Payer: Self-pay | Admitting: Gastroenterology

## 2024-05-01 ENCOUNTER — Encounter: Payer: Self-pay | Admitting: Physician Assistant

## 2024-05-01 ENCOUNTER — Ambulatory Visit: Attending: Physician Assistant | Admitting: Physician Assistant

## 2024-05-01 VITALS — BP 104/70 | HR 81 | Temp 97.3°F | Ht 61.0 in | Wt 166.7 lb

## 2024-05-01 VITALS — BP 112/74 | HR 65 | Ht 61.0 in | Wt 168.0 lb

## 2024-05-01 DIAGNOSIS — Z01810 Encounter for preprocedural cardiovascular examination: Secondary | ICD-10-CM

## 2024-05-01 DIAGNOSIS — D500 Iron deficiency anemia secondary to blood loss (chronic): Secondary | ICD-10-CM | POA: Diagnosis not present

## 2024-05-01 DIAGNOSIS — K449 Diaphragmatic hernia without obstruction or gangrene: Secondary | ICD-10-CM | POA: Diagnosis not present

## 2024-05-01 DIAGNOSIS — I38 Endocarditis, valve unspecified: Secondary | ICD-10-CM | POA: Diagnosis not present

## 2024-05-01 DIAGNOSIS — I10 Essential (primary) hypertension: Secondary | ICD-10-CM | POA: Diagnosis not present

## 2024-05-01 DIAGNOSIS — I480 Paroxysmal atrial fibrillation: Secondary | ICD-10-CM | POA: Diagnosis not present

## 2024-05-01 DIAGNOSIS — Z7901 Long term (current) use of anticoagulants: Secondary | ICD-10-CM

## 2024-05-01 MED ORDER — PANTOPRAZOLE SODIUM 40 MG PO TBEC: 40.0000 mg | DELAYED_RELEASE_TABLET | Freq: Two times a day (BID) | ORAL | 0 refills | Status: DC

## 2024-05-01 NOTE — Progress Notes (Signed)
 Chrishawn Kring Faizan Keara Pagliarulo , M.D. Gastroenterology & Hepatology [facility] [address] [place], [place] [address] Primary Care Physician: Jerrell Cleatus Ned, MD [address] [place] [address]  Chief Complaint: Iron deficiency anemia  History of Present Illness:  This is a 73 y.o. female with hypertension A-fib on Eliquis  is here for follow-up on iron deficiency anemia  Patient was recently seen while admitted to hospital for positive FOBT and an acute anemia underwent upper endoscopy and colonoscopy found to have large hiatal hernia and Cameron lesions.  Colonoscopy without any overt bleeding but prep was suboptimal Patient has been taking iron supplementation since being discharged and currently is on Eliquis  The patient denies having any nausea, vomiting, fever, chills, hematochezia, melena, hematemesis, abdominal distention, abdominal pain, diarrhea, jaundice, pruritus or weight loss.  Last EGD:03/2024  - Normal esophagus. - Large hiatal hernia with multiple Cameron ulcers. - Gastritis. Biopsied. - A single duodenal polyp. Biopsied.  Last Colonoscopy:03/2024 - Non- bleeding internal hemorrhoids. - Stool in the entire examined colon. - No specimens collected.  Past Medical History: Past Medical History:  Diagnosis Date   Hypertension    Urinary tract obstruction by kidney stone 03/08/2017    Past Surgical History: Past Surgical History:  Procedure Laterality Date   BREAST SURGERY     lumpectomy   COLONOSCOPY N/A 04/01/2024   Procedure: COLONOSCOPY;  Surgeon: Cindie Carlin POUR, DO;  Location: [facility];  Service: Endoscopy;  Laterality: N/A;   CYSTOSCOPY WITH STENT PLACEMENT Left 03/09/2017   Procedure: CYSTOSCOPY, RETROGRADE PYELOGRAM WITH STENT PLACEMENT;  Surgeon: Gaston Hamilton, MD;  Location: [facility];  Service: Urology;  Laterality: Left;   ESOPHAGOGASTRODUODENOSCOPY N/A 04/01/2024   Procedure: EGD  (ESOPHAGOGASTRODUODENOSCOPY);  Surgeon: Cindie Carlin POUR, DO;  Location: [facility];  Service: Endoscopy;  Laterality: N/A;    Family History: Family History  Problem Relation Age of Onset   Hypertension Mother    Ulcers Father    Kidney cancer Neg Hx     Social History: Social History   Tobacco Use  Smoking Status Never  Smokeless Tobacco Never   Social History   Substance and Sexual Activity  Alcohol Use No   Social History   Substance and Sexual Activity  Drug Use No    Allergies: No Known Allergies  Medications: Current Outpatient Medications  Medication Sig Dispense Refill   apixaban  (ELIQUIS ) 5 MG TABS tablet Take 1 tablet (5 mg total) by mouth 2 (two) times daily. 60 tablet 2   ferrous sulfate  325 (65 FE) MG tablet Take 1 tablet (325 mg total) by mouth daily with breakfast. 30 tablet 3   losartan -hydrochlorothiazide (HYZAAR) 100-25 MG tablet Take 1 tablet by mouth daily. 90 tablet 1   metoprolol  succinate (TOPROL -XL) 50 MG 24 hr tablet Take 1 tablet (50 mg total) by mouth daily. Take with or immediately following a meal. 90 tablet 1   pantoprazole  (PROTONIX ) 40 MG tablet Take 1 tablet (40 mg total) by mouth 2 (two) times daily before a meal. 60 tablet 0   No current facility-administered medications for this visit.    Review of Systems: GENERAL: negative for malaise, night sweats HEENT: No changes in hearing or vision, no nose bleeds or other nasal problems. NECK: Negative for lumps, goiter, pain and significant neck swelling RESPIRATORY: Negative for cough, wheezing CARDIOVASCULAR: Negative for chest pain, leg swelling, palpitations, orthopnea GI: SEE HPI MUSCULOSKELETAL: Negative for joint pain or swelling, back pain, and muscle pain.  SKIN: Negative for lesions, rash HEMATOLOGY Negative for prolonged bleeding, bruising easily, and swollen nodes. ENDOCRINE: Negative for cold or heat intolerance, polyuria, polydipsia and goiter. NEURO: negative for  tremor, gait imbalance, syncope and seizures. The remainder of the review of systems is noncontributory.   Physical Exam: BP 104/70   Pulse 81   Temp (!) 97.3 F (36.3 C)   Ht 5' 1 (1.549 m)   Wt 166 lb 11.2 oz (75.6 kg)   BMI 31.50 kg/m  GENERAL: The patient is AO x3, in no acute distress. HEENT: Head is normocephalic and atraumatic. EOMI are intact. Mouth is well hydrated and without lesions. NECK: Supple. No masses LUNGS: Clear to auscultation. No presence of rhonchi/wheezing/rales. Adequate chest expansion HEART: RRR, normal s1 and s2. ABDOMEN: Soft, nontender, no guarding, no peritoneal signs, and nondistended. BS +. No masses.   Imaging/Labs: as above     Latest Ref Rng & Units 04/01/2024   10:35 AM 03/31/2024    5:03 AM 03/30/2024    3:10 AM  CBC  WBC 4.0 - 10.5 K/uL 6.7   7.1   Hemoglobin 12.0 - 15.0 g/dL 9.7  8.8  8.4   Hematocrit 36.0 - 46.0 % 32.8  29.0  27.0   Platelets 150 - 400 K/uL 286   258    Lab Results  Component Value Date   IRON 22 (L) 03/31/2024   TIBC 406 03/31/2024   FERRITIN 16 03/31/2024    I personally reviewed and interpreted the available labs, imaging and endoscopic files.  Impression and Plan:  This is a 73 y.o. female with hypertension A-fib on Eliquis  is here for follow-up on iron deficiency anemia  # Iron deficiency anemia #Positive FOBT   Patient iron deficiency anemia is likely due to large hiatal hernia leading to [facility] lesions as seen on recent upper endoscopy  Although colonoscopy performed inpatient was with suboptimal prep  Recommend repeat colonoscopy with extended bowel prep for workup of iron deficiency anemia; will obtain clearance to hold Eliquis  for 2 days  Continue iron supplementation every 48 hours Hematology referral Will check CBC iron profile vitamin B12 and folic now  If patient has inadequate response to iron supplementation and with negative colonoscopy may refer for hiatal hernia repair  evaluation  All questions were answered.      Dannon Perlow Faizan Jairen Goldfarb, MD Gastroenterology and Hepatology [facility]   This chart has been completed using [facility] Dictation software, and while attempts have been made to ensure accuracy , certain words and phrases may not be transcribed as intended

## 2024-05-01 NOTE — Patient Instructions (Signed)
 It was very nice to meet you today, as dicussed with will plan for the following :  1) take protonix  40mg , twice daily  2) colonoscopy  3) labwork  4) hematology referral

## 2024-05-01 NOTE — Patient Instructions (Signed)
 Medication Instructions:   Your physician recommends that you continue on your current medications as directed. Please refer to the Current Medication list given to you today.   Labwork: None today  Testing/Procedures: None today  Follow-Up: 3 months  Any Other Special Instructions Will Be Listed Below (If Applicable).  Cardioversion for atrial fibrillation  vs medication -Amiodarone   If you need a refill on your cardiac medications before your next appointment, please call your pharmacy.

## 2024-05-01 NOTE — Telephone Encounter (Signed)
    05/01/24  Rock ORN Hodgkin May 15, 1951  What type of surgery is being performed? Colonoscopy  When is surgery scheduled? To be determined  What type of clearance is required (medical or pharmacy to hold medication or both? Medication  Are there any medications that need to be held prior to surgery and how long? Eliquis , hold 2 days prior  Name of physician performing surgery?  Dr. Cinderella Rouse Gastroenterology at [facility] Phone: [phone] Fax: [phone]  Anethesia type (none, local, MAC, general)? MAC

## 2024-05-02 LAB — IRON,TIBC AND FERRITIN PANEL
Ferritin: 80 ng/mL (ref 15–150)
Iron Saturation: 24 % (ref 15–55)
Iron: 91 ug/dL (ref 27–139)
Total Iron Binding Capacity: 378 ug/dL (ref 250–450)
UIBC: 287 ug/dL (ref 118–369)

## 2024-05-02 LAB — CBC WITH DIFFERENTIAL/PLATELET
Basophils Absolute: 0.1 x10E3/uL (ref 0.0–0.2)
Basos: 1 %
EOS (ABSOLUTE): 0.1 x10E3/uL (ref 0.0–0.4)
Eos: 1 %
Hematocrit: 39.7 % (ref 34.0–46.6)
Hemoglobin: 12.4 g/dL (ref 11.1–15.9)
Immature Grans (Abs): 0 x10E3/uL (ref 0.0–0.1)
Immature Granulocytes: 0 %
Lymphocytes Absolute: 1 x10E3/uL (ref 0.7–3.1)
Lymphs: 11 %
MCH: 28.1 pg (ref 26.6–33.0)
MCHC: 31.2 g/dL — ABNORMAL LOW (ref 31.5–35.7)
MCV: 90 fL (ref 79–97)
Monocytes Absolute: 0.5 x10E3/uL (ref 0.1–0.9)
Monocytes: 6 %
Neutrophils Absolute: 7.4 x10E3/uL — ABNORMAL HIGH (ref 1.4–7.0)
Neutrophils: 81 %
Platelets: 236 x10E3/uL (ref 150–450)
RBC: 4.41 x10E6/uL (ref 3.77–5.28)
RDW: 14.4 % (ref 11.7–15.4)
WBC: 9.1 x10E3/uL (ref 3.4–10.8)

## 2024-05-02 LAB — FOLATE: Folate: 15.9 ng/mL (ref >3.0)

## 2024-05-02 LAB — VITAMIN B12: Vitamin B-12: 460 pg/mL (ref 232–1245)

## 2024-05-02 NOTE — Telephone Encounter (Signed)
 Patient is anticoagulated with apixaban  for primary prevention of cerebral infarction due to recent diagnosis of atrial fibrillation.  No recent ischemic events.  It is low risk to hold apixaban  for 2 days prior to a colonoscopy.  Thank you.

## 2024-05-07 ENCOUNTER — Ambulatory Visit (INDEPENDENT_AMBULATORY_CARE_PROVIDER_SITE_OTHER): Payer: Self-pay | Admitting: Gastroenterology

## 2024-05-07 NOTE — Progress Notes (Signed)
 Hi Tanya ,  Can you please call the patient and tell the patient the lab work shows improved iron deficiency anemia which is a good news.  Continue taking Protonix  daily  Thanks,  Bartley Vuolo Faizan Fatmata Legere, MD Gastroenterology and Hepatology [facility]astroenter[facility]  =========== Labs hemoglobin 12.4 improved from 9.7 Vitamin B12 460 Folate 15.9 Ferritin 80 improved from 16; resolved iron deficiency

## 2024-05-13 ENCOUNTER — Ambulatory Visit: Admitting: Student in an Organized Health Care Education/Training Program

## 2024-05-13 ENCOUNTER — Encounter: Payer: Self-pay | Admitting: Student in an Organized Health Care Education/Training Program

## 2024-05-13 VITALS — BP 147/102 | HR 85 | Wt 162.4 lb

## 2024-05-13 DIAGNOSIS — R309 Painful micturition, unspecified: Secondary | ICD-10-CM | POA: Diagnosis not present

## 2024-05-13 DIAGNOSIS — I480 Paroxysmal atrial fibrillation: Secondary | ICD-10-CM

## 2024-05-13 DIAGNOSIS — D500 Iron deficiency anemia secondary to blood loss (chronic): Secondary | ICD-10-CM

## 2024-05-13 DIAGNOSIS — I10 Essential (primary) hypertension: Secondary | ICD-10-CM

## 2024-05-13 LAB — POCT URINALYSIS DIPSTICK
Bilirubin, UA: NEGATIVE
Blood, UA: 1
Glucose, UA: NEGATIVE
Ketones, UA: NEGATIVE
Nitrite, UA: NEGATIVE
Protein, UA: NEGATIVE
Spec Grav, UA: 1.015 (ref 1.010–1.025)
Urobilinogen, UA: 0.2 U/dL
pH, UA: 6 (ref 5.0–8.0)

## 2024-05-13 NOTE — Assessment & Plan Note (Signed)
 Her blood pressure has improved with the current medication regimen, decreasing from 183/123 mmHg a month ago to 147/102 mmHg today. Home readings around 140 mmHg indicate good control. Continue the current antihypertensive regimen with Losartan -hydrochlorothiazide and monitor blood pressure at home regularly.  Medication understanding continues to be a big issue.  Seems to be doing much better with consistency.  I considered adding amlodipine today, but her blood pressure seems to be doing well and adding medications to her regimen tends to cause a lot of confusion.  Will monitor her blood pressure carefully and add amlodipine in the future if needed.

## 2024-05-13 NOTE — Patient Instructions (Signed)
°  VISIT SUMMARY: Today, we reviewed your blood pressure and heart condition, discussed your medication regimen, and planned for future tests and procedures.  YOUR PLAN: -HYPERTENSION: Hypertension means high blood pressure. Your blood pressure has improved with your current medication, Losartan -hydrochlorothiazide. Please continue taking this medication and monitor your blood pressure at home regularly.  -ATRIAL FIBRILLATION: Atrial fibrillation is an irregular and often rapid heart rate. You are currently taking Apixaban  to prevent blood clots. Continue taking Apixaban  5 mg twice daily and monitor your symptoms. We will discuss the possibility of a procedure to restore your heart rhythm with cardiology in March.  -IRON DEFICIENCY ANEMIA DUE TO CHRONIC BLOOD LOSS: Iron deficiency anemia is a condition where you lack enough healthy red blood cells due to chronic blood loss. We have ordered blood work to check your iron levels.  -GENERAL HEALTH MAINTENANCE: We discussed your need for a colonoscopy with gastroenterology. Your previous attempt at preparation was unsuccessful, so we will need to plan for this procedure again.  INSTRUCTIONS: Please continue your current medications and monitor your blood pressure at home. Follow up with cardiology in March to discuss your heart condition. Complete the blood work as ordered to check your iron levels. Plan for a future colonoscopy with gastroenterology.

## 2024-05-13 NOTE — Assessment & Plan Note (Signed)
 She has a persistent irregular heartbeat and is on Apixaban  for anticoagulation. She declined cardioversion, preferring to discuss further in March. Continue Apixaban  5 mg orally twice daily. Monitor symptoms and contact if she worsens.  She was offered cardioversion by cardiology but declined.

## 2024-05-13 NOTE — Assessment & Plan Note (Signed)
 Iron deficiency anemia is secondary to chronic blood loss most likely from hiatal hernia with Ole lesion. Blood work is planned to assess current status. Ordered blood work to check iron levels.  Being planned for a repeat colonoscopy as an outpatient.

## 2024-05-13 NOTE — Progress Notes (Signed)
 Established Patient Office Visit  Patient ID: Jody Hernandez, female    DOB: 09-19-1950  Age: 73 y.o. MRN: [id number] PCP: Jerrell Jody Ned, MD  Chief Complaint  Patient presents with   Hypertension    4 week follow up  Frequent urination, pain when urinating  Daughter states blackout spells     Subjective:     HPI  Discussed the use of AI scribe software for clinical note transcription with the patient, who gave verbal consent to proceed.  History of Present Illness Jody Hernandez is a 74 year old female with hypertension and atrial fibrillation who presents for medication refill and blood pressure management.  Her blood pressure has improved since her last visit, decreasing from 183/123 mmHg a month ago to 147/102 mmHg today. She attributes this improvement to a change in her medication regimen, having stopped lisinopril  and started a new medication. She monitors her blood pressure at home, with readings around 140 mmHg.  She continues to experience an irregular heartbeat and describes a sensation of her heart stopping momentarily. She is taking a blood thinner regularly, twice a day at 7 AM and 7 PM, although sometimes she takes it at 7:30 AM. She mentions a previous discussion with cardiology about a procedure to restore her heart rhythm but has decided to wait until her next appointment in March unless her condition worsens.  She has been in contact with gastroenterology regarding a future colonoscopy. She attempted the preparation for a previous procedure, but it was unsuccessful.     Objective:     BP (!) 147/102   Pulse 85   Wt 162 lb 6.4 oz (73.7 kg)   BMI 30.69 kg/m   Physical Exam  Gen: Well-appearing woman Neck: Normal thyroid , no nodules or adenopathy Heart: Irregular rhythm, no murmur Lungs: Unlabored, clear throughout Ext: Warm no edema   Results for orders placed or performed in visit on 05/13/24  POCT Urinalysis Dipstick  Result Value Ref Range    Color, UA yellow    Clarity, UA cloudy    Glucose, UA Negative Negative   Bilirubin, UA negative    Ketones, UA negative    Spec Grav, UA 1.015 1.010 - 1.025   Blood, UA 1    pH, UA 6.0 5.0 - 8.0   Protein, UA Negative Negative   Urobilinogen, UA 0.2 0.2 or 1.0 E.U./dL   Nitrite, UA negative    Leukocytes, UA Large (3+) (A) Negative   Appearance     Odor       Assessment & Plan:   Problem List Items Addressed This Visit       High   Hypertension - Primary (Chronic)   Her blood pressure has improved with the current medication regimen, decreasing from 183/123 mmHg a month ago to 147/102 mmHg today. Home readings around 140 mmHg indicate good control. Continue the current antihypertensive regimen with Losartan -hydrochlorothiazide and monitor blood pressure at home regularly.  Medication understanding continues to be a big issue.  Seems to be doing much better with consistency.  I considered adding amlodipine today, but her blood pressure seems to be doing well and adding medications to her regimen tends to cause a lot of confusion.  Will monitor her blood pressure carefully and add amlodipine in the future if needed.      Relevant Orders   Basic metabolic panel with GFR   Atrial fibrillation (HCC) (Chronic)   She has a persistent irregular heartbeat and is on Apixaban   for anticoagulation. She declined cardioversion, preferring to discuss further in March. Continue Apixaban  5 mg orally twice daily. Monitor symptoms and contact if she worsens.  She was offered cardioversion by cardiology but declined.        Medium    Iron deficiency anemia due to chronic blood loss (Chronic)   Iron deficiency anemia is secondary to chronic blood loss most likely from hiatal hernia with Ole lesion. Blood work is planned to assess current status. Ordered blood work to check iron levels.  Being planned for a repeat colonoscopy as an outpatient.      Relevant Orders   IBC + Ferritin   CBC    Other Visit Diagnoses       Urination pain       Relevant Orders   POCT Urinalysis Dipstick (Completed)       Return in about 3 months (around 08/11/2024).    Jody Debby Specking, MD [facility]

## 2024-05-14 LAB — CBC
HCT: 39.7 % (ref 36.0–46.0)
Hemoglobin: 13 g/dL (ref 12.0–15.0)
MCHC: 32.8 g/dL (ref 30.0–36.0)
MCV: 85.8 fl (ref 78.0–100.0)
Platelets: 229 K/uL (ref 150.0–400.0)
RBC: 4.62 Mil/uL (ref 3.87–5.11)
RDW: 16.9 % — ABNORMAL HIGH (ref 11.5–15.5)
WBC: 8.4 K/uL (ref 4.0–10.5)

## 2024-05-14 LAB — BASIC METABOLIC PANEL WITH GFR
BUN: 30 mg/dL — ABNORMAL HIGH (ref 6–23)
CO2: 27 meq/L (ref 19–32)
Calcium: 10 mg/dL (ref 8.4–10.5)
Chloride: 95 meq/L — ABNORMAL LOW (ref 96–112)
Creatinine, Ser: 1.35 mg/dL — ABNORMAL HIGH (ref 0.40–1.20)
GFR: 38.39 mL/min — ABNORMAL LOW (ref >60.00)
Glucose, Bld: 86 mg/dL (ref 70–99)
Potassium: 3.4 meq/L — ABNORMAL LOW (ref 3.5–5.1)
Sodium: 136 meq/L (ref 135–145)

## 2024-05-14 LAB — IBC + FERRITIN
Ferritin: 49.1 ng/mL (ref 10.0–291.0)
Iron: 110 ug/dL (ref 42–145)
Saturation Ratios: 26.5 % (ref 20.0–50.0)
TIBC: 415.8 ug/dL (ref 250.0–450.0)
Transferrin: 297 mg/dL (ref 212.0–360.0)

## 2024-05-15 ENCOUNTER — Ambulatory Visit: Payer: Self-pay | Admitting: Student in an Organized Health Care Education/Training Program

## 2024-05-15 NOTE — Progress Notes (Signed)
 Called patient and was unable to leave a vm to return call due to mailbox being full. If patient calls back, please relay lab results. I am asking Dr. Jerrell about scheduling as he does not have any openings Friday.   Dr. Jerrell - how would you like to schedule patient if she calls back? You do not have any openings and I am not able to double book for you.

## 2024-05-20 NOTE — Telephone Encounter (Signed)
Attempted to call pt, unable to leave message due to mailbox being full.

## 2024-05-21 NOTE — Progress Notes (Signed)
 Scheduled appt with patient and strongly recommended she bring her medication bottles.

## 2024-06-04 ENCOUNTER — Encounter: Payer: Self-pay | Admitting: Student in an Organized Health Care Education/Training Program

## 2024-06-04 ENCOUNTER — Other Ambulatory Visit (INDEPENDENT_AMBULATORY_CARE_PROVIDER_SITE_OTHER): Payer: Self-pay

## 2024-06-04 ENCOUNTER — Ambulatory Visit: Admitting: Student in an Organized Health Care Education/Training Program

## 2024-06-04 VITALS — BP 138/93 | HR 73

## 2024-06-04 DIAGNOSIS — D500 Iron deficiency anemia secondary to blood loss (chronic): Secondary | ICD-10-CM

## 2024-06-04 DIAGNOSIS — I480 Paroxysmal atrial fibrillation: Secondary | ICD-10-CM | POA: Diagnosis not present

## 2024-06-04 DIAGNOSIS — I10 Essential (primary) hypertension: Secondary | ICD-10-CM | POA: Diagnosis not present

## 2024-06-04 DIAGNOSIS — K449 Diaphragmatic hernia without obstruction or gangrene: Secondary | ICD-10-CM

## 2024-06-04 MED ORDER — PANTOPRAZOLE SODIUM 40 MG PO TBEC: 40.0000 mg | DELAYED_RELEASE_TABLET | Freq: Two times a day (BID) | ORAL | 0 refills | Status: AC

## 2024-06-04 NOTE — Assessment & Plan Note (Signed)
 Blood pressure is well-controlled with the current regimen, with a recent reading of 138/93 mmHg. Continue the current antihypertensive medication regimen and monitor blood pressure at home regularly.  Lots of confusion in the past with her medication regimen.  But she brought all of her medicines today and we went over them together.  I think she is currently doing well on combination Hyzaar, blood pressure is not perfect but it is I think acceptable for now.

## 2024-06-04 NOTE — Progress Notes (Signed)
" ° °  Established Patient Office Visit  Patient ID: Jody Hernandez, female    DOB: 07-16-1950  Age: 74 y.o. MRN: [id number] PCP: Jerrell Cleatus Ned, MD  Chief Complaint  Patient presents with   [facility] management     Go over medications     Subjective:     HPI  Discussed the use of AI scribe software for clinical note transcription with the patient, who gave verbal consent to proceed.  History of Present Illness Jody Hernandez is a 74 year old female with atrial fibrillation and iron deficiency anemia who presents for follow-up of her blood pressure management.  Her current medication regimen for blood pressure management includes losartan  HCT, taken once daily. She monitors her blood pressure at home a couple of times a week, with readings sometimes around 140 mmHg. Today, her blood pressure is 138/93 mmHg.  She has a history of atrial fibrillation and is on two medications to manage this condition. One medication helps control her heart rate, and the other reduces her risk of stroke. She describes these medications as keeping her heart beating.  She has iron deficiency anemia, which was identified during a hospital stay. She is unsure whether a polyp was removed from her colon, as she does not recall having the procedure. She is on medication for her iron levels.     Objective:     BP (!) 138/93   Pulse 73   SpO2 99%   Physical Exam  Gen: Well-appearing woman Neck: Normal thyroid , no nodules or adenopathy Heart: Regular, no murmur Lungs: Unlabored, clear throughout Ext warm, chronic 1+ pitting lower extremity edema with chronic venous stasis changes:     Assessment & Plan:   Problem List Items Addressed This Visit       High   Hypertension - Primary (Chronic)   Blood pressure is well-controlled with the current regimen, with a recent reading of 138/93 mmHg. Continue the current antihypertensive medication regimen and monitor blood pressure at home regularly.   Lots of confusion in the past with her medication regimen.  But she brought all of her medicines today and we went over them together.  I think she is currently doing well on combination Hyzaar, blood pressure is not perfect but it is I think acceptable for now.      Atrial fibrillation (HCC) (Chronic)   Chronic atrial fibrillation is managed with metoprolol  and apixaban  for primary prevention of ischemic stroke. Cardioversion was discussed as a safe option to restore rhythm.  Continue current medications and discuss cardioversion with the cardiologist during the upcoming appointment.  Excellent functional capacity currently.  No issues with bleeding on apixaban .        Medium    Iron deficiency anemia due to chronic blood loss (Chronic)   Chronic iron deficiency anemia is likely due to gastrointestinal blood loss. A previous colonoscopy was inconclusive due to inadequate prep. A repeat colonoscopy is needed to rule out bleeding sources.  Schedule this with GI specialist, Dr. Cinderella, and continue current iron supplementation.       Return in about 3 months (around 09/02/2024).    Cleatus Ned Jerrell, MD [facility]   "

## 2024-06-04 NOTE — Assessment & Plan Note (Signed)
 Chronic iron deficiency anemia is likely due to gastrointestinal blood loss. A previous colonoscopy was inconclusive due to inadequate prep. A repeat colonoscopy is needed to rule out bleeding sources.  Schedule this with GI specialist, Dr. Cinderella, and continue current iron supplementation.

## 2024-06-04 NOTE — Assessment & Plan Note (Signed)
 Chronic atrial fibrillation is managed with metoprolol  and apixaban  for primary prevention of ischemic stroke. Cardioversion was discussed as a safe option to restore rhythm.  Continue current medications and discuss cardioversion with the cardiologist during the upcoming appointment.  Excellent functional capacity currently.  No issues with bleeding on apixaban .

## 2024-06-04 NOTE — Patient Instructions (Signed)
" °  VISIT SUMMARY: Today, we reviewed your blood pressure management, atrial fibrillation, and iron deficiency anemia. Your blood pressure is well-controlled with your current medication, and we discussed your ongoing treatment for atrial fibrillation and iron deficiency anemia.  YOUR PLAN: -ATRIAL FIBRILLATION: Atrial fibrillation is an irregular and often rapid heart rate that can increase your risk of strokes, heart failure, and other heart-related complications. We discussed cardioversion as a safe option to restore your heart's rhythm, but you prefer to continue with your current medications. Please continue taking your medications as prescribed and discuss cardioversion with your cardiologist during your upcoming appointment.  -PRIMARY HYPERTENSION: Primary hypertension is high blood pressure with no identifiable cause. Your blood pressure is well-controlled with your current medication regimen, with a recent reading of 138/93 mmHg. Please continue taking your antihypertensive medication as prescribed and monitor your blood pressure at home regularly.  -IRON DEFICIENCY ANEMIA DUE TO CHRONIC BLOOD LOSS: Iron deficiency anemia occurs when your body doesn't have enough iron to produce adequate levels of hemoglobin, which is needed to carry oxygen in your blood. This is likely due to chronic blood loss from your gastrointestinal tract. A previous colonoscopy was inconclusive, so a repeat colonoscopy is needed to rule out any bleeding sources. Please schedule this with Dr. Cinderella, your GI specialist, and continue your current iron supplementation.  INSTRUCTIONS: Please schedule a repeat colonoscopy with Dr. Cinderella to investigate the source of your gastrointestinal blood loss. Continue taking all your medications as prescribed and monitor your blood pressure at home regularly. Discuss the option of cardioversion with your cardiologist during your upcoming appointment.   "

## 2024-06-12 ENCOUNTER — Telehealth: Payer: Self-pay

## 2024-06-12 ENCOUNTER — Ambulatory Visit

## 2024-06-12 NOTE — Telephone Encounter (Signed)
 AWV schedule has ended, rescheduled patient to Thursday next week

## 2024-06-12 NOTE — Telephone Encounter (Signed)
 Copied from CRM [id number]. Topic: Appointments - Scheduling Inquiry for Clinic >> Jun 12, 2024 10:57 AM Burnard DEL wrote: Reason for CRM: Patient missed her AWV phone call with [facility] .Patient would like to know if [facility] is able to still conduct phone call today if she has time.

## 2024-06-13 ENCOUNTER — Ambulatory Visit: Admitting: Family Medicine

## 2024-06-20 ENCOUNTER — Ambulatory Visit: Admitting: *Deleted

## 2024-06-20 VITALS — Ht 61.0 in | Wt 162.0 lb

## 2024-06-20 DIAGNOSIS — Z0000 Encounter for general adult medical examination without abnormal findings: Secondary | ICD-10-CM | POA: Diagnosis not present

## 2024-06-20 NOTE — Progress Notes (Signed)
 "  Chief Complaint  Patient presents with   Medicare Wellness     Subjective:   Jody Hernandez is a 73 y.o. female who presents for a Medicare Annual Wellness Visit.  No voiced or noted concerns at this time   Visit info / Clinical Intake: Medicare Wellness Visit Type:: Subsequent Annual Wellness Visit Persons participating in visit and providing information:: patient Medicare Wellness Visit Mode:: Telephone If telephone:: video declined Since this visit was completed virtually, some vitals may be partially provided or unavailable. Missing vitals are due to the limitations of the virtual format.: Unable to obtain vitals - no equipment If Telephone or Video please confirm:: I connected with patient using audio/video enable telemedicine. I verified patient identity with two identifiers, discussed telehealth limitations, and patient agreed to proceed. Patient Location:: home Provider Location:: office Interpreter Needed?: No Pre-visit prep was completed: no AWV questionnaire completed by patient prior to visit?: no Living arrangements:: lives with spouse/significant other Patient's Overall Health Status Rating: (!) fair Typical amount of pain: none Does pain affect daily life?: no Are you currently prescribed opioids?: no  Dietary Habits and Nutritional Risks How many meals a day?: 3 Eats fruit and vegetables daily?: yes Most meals are obtained by: preparing own meals; eating out In the last 2 weeks, have you had any of the following?: none Diabetic:: no  Functional Status Activities of Daily Living (to include ambulation/medication): Independent Ambulation: Independent Medication Administration: Independent Home Management (perform basic housework or laundry): Independent Manage your own finances?: yes Primary transportation is: driving Concerns about vision?: no *vision screening is required for WTM* Concerns about hearing?: no  Fall Screening Falls in the past year?:  0 Number of falls in past year: 0 Was there an injury with Fall?: 0 Fall Risk Category Calculator: 0 Patient Fall Risk Level: Low Fall Risk  Fall Risk Patient at Risk for Falls Due to: Impaired mobility Fall risk Follow up: Falls evaluation completed; Education provided; Falls prevention discussed  Home and Transportation Safety: All rugs have non-skid backing?: N/A, no rugs All stairs or steps have railings?: N/A, no stairs Grab bars in the bathtub or shower?: yes Have non-skid surface in bathtub or shower?: (!) no Good home lighting?: yes Regular seat belt use?: yes Hospital stays in the last year:: (!) yes How many hospital stays:: 1  Cognitive Assessment Difficulty concentrating, remembering, or making decisions? : no Will 6CIT or Mini Cog be Completed: yes What year is it?: 0 points What month is it?: 0 points Give patient an address phrase to remember (5 components): its very sunny outside today in January About what time is it?: 0 points Count backwards from 20 to 1: 0 points Say the months of the year in reverse: 0 points Repeat the address phrase from earlier: 0 points 6 CIT Score: 0 points  Advance Directives (For Healthcare) Does Patient Have a Medical Advance Directive?: No Would patient like information on creating a medical advance directive?: No - Patient declined  Reviewed/Updated  Reviewed/Updated: Reviewed All (Medical, Surgical, Family, Medications, Allergies, Care Teams, Patient Goals); Surgical History; Family History; Medications; Allergies; Care Teams; Patient Goals; Medical History    Allergies (verified) Patient has no known allergies.   Current Medications (verified) Outpatient Encounter Medications as of 06/20/2024  Medication Sig   apixaban  (ELIQUIS ) 5 MG TABS tablet Take 1 tablet (5 mg total) by mouth 2 (two) times daily.   ferrous sulfate  325 (65 FE) MG tablet Take 1 tablet (325 mg total) by  mouth daily with breakfast.    losartan -hydrochlorothiazide (HYZAAR) 100-25 MG tablet Take 1 tablet by mouth daily.   metoprolol  succinate (TOPROL -XL) 50 MG 24 hr tablet Take 1 tablet (50 mg total) by mouth daily. Take with or immediately following a meal.   pantoprazole  (PROTONIX ) 40 MG tablet Take 1 tablet (40 mg total) by mouth 2 (two) times daily before a meal.   No facility-administered encounter medications on file as of 06/20/2024.    History: Past Medical History:  Diagnosis Date   Hypertension    Urinary tract obstruction by kidney stone 03/08/2017   Past Surgical History:  Procedure Laterality Date   BREAST SURGERY     lumpectomy   COLONOSCOPY N/A 04/01/2024   Procedure: COLONOSCOPY;  Surgeon: Cindie Carlin POUR, DO;  Location: [facility];  Service: Endoscopy;  Laterality: N/A;   CYSTOSCOPY WITH STENT PLACEMENT Left 03/09/2017   Procedure: CYSTOSCOPY, RETROGRADE PYELOGRAM WITH STENT PLACEMENT;  Surgeon: Gaston Hamilton, MD;  Location: [facility];  Service: Urology;  Laterality: Left;   ESOPHAGOGASTRODUODENOSCOPY N/A 04/01/2024   Procedure: EGD (ESOPHAGOGASTRODUODENOSCOPY);  Surgeon: Cindie Carlin POUR, DO;  Location: [facility];  Service: Endoscopy;  Laterality: N/A;   Family History  Problem Relation Age of Onset   Hypertension Mother    Ulcers Father    Kidney cancer Neg Hx    Social History   Occupational History   Not on file  Tobacco Use   Smoking status: Never   Smokeless tobacco: Never  Vaping Use   Vaping status: Never Used  Substance and Sexual Activity   Alcohol use: No   Drug use: No   Sexual activity: Not Currently    Birth control/protection: None   Tobacco Counseling Counseling given: Not Answered  SDOH Screenings   Food Insecurity: No Food Insecurity (06/20/2024)  Housing: Low Risk (06/20/2024)  Transportation Needs: No Transportation Needs (06/20/2024)  Utilities: Not At Risk (06/20/2024)  Depression (PHQ2-9): Low Risk (06/20/2024)  Recent Concern: Depression (PHQ2-9) -  Medium Risk (04/16/2024)  Physical Activity: Inactive (06/20/2024)  Social Connections: Moderately Integrated (06/20/2024)  Stress: No Stress Concern Present (06/20/2024)  Tobacco Use: Low Risk (06/20/2024)  Health Literacy: Adequate Health Literacy (06/20/2024)   See flowsheets for full screening details  Depression Screen PHQ 2 & 9 Depression Scale- Over the past 2 weeks, how often have you been bothered by any of the following problems? Little interest or pleasure in doing things: 0 Feeling down, depressed, or hopeless (PHQ Adolescent also includes...irritable): 0 PHQ-2 Total Score: 0 Trouble falling or staying asleep, or sleeping too much: 0 Feeling tired or having little energy: 1 Poor appetite or overeating (PHQ Adolescent also includes...weight loss): 0 Feeling bad about yourself - or that you are a failure or have let yourself or your family down: 0 Trouble concentrating on things, such as reading the newspaper or watching television (PHQ Adolescent also includes...like school work): 0 Moving or speaking so slowly that other people could have noticed. Or the opposite - being so fidgety or restless that you have been moving around a lot more than usual: 0 Thoughts that you would be better off dead, or of hurting yourself in some way: 0 PHQ-9 Total Score: 1 If you checked off any problems, how difficult have these problems made it for you to do your work, take care of things at home, or get along with other people?: Not difficult at all     Goals Addressed  This Visit's Progress    Patient Stated       Continue current lifestyle             Objective:    Today's Vitals   06/20/24 0848  Weight: 162 lb (73.5 kg)  Height: 5' 1 (1.549 m)   Body mass index is 30.61 kg/m.  Hearing/Vision screen Hearing Screening - Comments:: No trouble hearing Vision Screening - Comments:: Education provided  Immunizations and Health Maintenance Health Maintenance  Topic  Date Due   Hepatitis C Screening  Never done   DTaP/Tdap/Td (1 - Tdap) Never done   Zoster Vaccines- Shingrix (1 of 2) Never done   COVID-19 Vaccine (1 - 2025-26 season) Never done   Medicare Annual Wellness (AWV)  06/20/2025   Colonoscopy  04/30/2034   Pneumococcal Vaccine: 50+ Years  Completed   Influenza Vaccine  Completed   Bone Density Scan  Completed   Meningococcal B Vaccine  Aged Out   Mammogram  Discontinued        Assessment/Plan:  This is a routine wellness examination for [place].  Patient Care Team: Jerrell Cleatus Ned, MD as PCP - General (Internal Medicine) Debera Jayson MATSU, MD as PCP - Cardiology (Cardiology) Harvey Margo CROME, MD (Inactive) as Consulting Physician (Gastroenterology)  I have personally reviewed and noted the following in the patients chart:   Medical and social history Use of alcohol, tobacco or illicit drugs  Current medications and supplements including opioid prescriptions. Functional ability and status Nutritional status Physical activity Advanced directives List of other physicians Hospitalizations, surgeries, and ER visits in previous 12 months Vitals Screenings to include cognitive, depression, and falls Referrals and appointments  No orders of the defined types were placed in this encounter.  In addition, I have reviewed and discussed with patient certain preventive protocols, quality metrics, and best practice recommendations. A written personalized care plan for preventive services as well as general preventive health recommendations were provided to patient.   Mliss Graff, LPN   [id number]/[id number]   Return in 1 year (on 06/20/2025).  After Visit Summary: (MyChart) Due to this being a telephonic visit, the after visit summary with patients personalized plan was offered to patient via MyChart   Nurse Notes:  "

## 2024-06-20 NOTE — Patient Instructions (Signed)
 [name],  Thank you for taking the time for your Medicare Wellness Visit. I appreciate your continued commitment to your health goals. Please review the care plan we discussed, and feel free to reach out if I can assist you further.  Please note that Annual Wellness Visits do not include a physical exam. Some assessments may be limited, especially if the visit was conducted virtually. If needed, we may recommend an in-person follow-up with your provider.  Ongoing Care Seeing your primary care provider every 3 to 6 months helps us  monitor your health and provide consistent, personalized care.   Referrals If a referral was made during today's visit and you haven't received any updates within two weeks, please contact the referred provider directly to check on the status.  Recommended Screenings:  Health Maintenance  Topic Date Due   Hepatitis C Screening  Never done   DTaP/Tdap/Td vaccine (1 - Tdap) Never done   Zoster (Shingles) Vaccine (1 of 2) Never done   COVID-19 Vaccine (1 - 2025-26 season) Never done   Medicare Annual Wellness Visit  06/20/2025   Colon Cancer Screening  04/30/2034   Pneumococcal Vaccine for age over 59  Completed   Flu Shot  Completed   Osteoporosis screening with Bone Density Scan  Completed   Meningitis B Vaccine  Aged Out   Breast Cancer Screening  Discontinued       06/20/2024    8:50 AM  Advanced Directives  Does Patient Have a Medical Advance Directive? No  Would patient like information on creating a medical advance directive? No - Patient declined    Vision: Annual vision screenings are recommended for [place] detection of glaucoma, cataracts, and diabetic retinopathy. These exams can also reveal signs of chronic conditions such as diabetes and high blood pressure.  Dental: Annual dental screenings help detect [place] signs of oral cancer, gum disease, and other conditions linked to overall health, including heart disease and diabetes.  Please see  the attached documents for additional preventive care recommendations.     [name] , Thank you for taking time to come for your Medicare Wellness Visit. I appreciate your ongoing commitment to your health goals. Please review the following plan we discussed and let me know if I can assist you in the future.   Screening recommendations/referrals: Colonoscopy:  Mammogram:  Bone Density:  Recommended yearly ophthalmology/optometry visit for glaucoma screening and checkup Recommended yearly dental visit for hygiene and checkup  Vaccinations: Influenza vaccine:  Pneumococcal vaccine:  Tdap vaccine:  Shingles vaccine:         Preventive Care 65 Years and Older, Female Preventive care refers to lifestyle choices and visits with your health care provider that can promote health and wellness. What does preventive care include? A yearly physical exam. This is also called an annual well check. Dental exams once or twice a year. Routine eye exams. Ask your health care provider how often you should have your eyes checked. Personal lifestyle choices, including: Daily care of your teeth and gums. Regular physical activity. Eating a healthy diet. Avoiding tobacco and drug use. Limiting alcohol use. Practicing safe sex. Taking low-dose aspirin every day. Taking vitamin and mineral supplements as recommended by your health care provider. What happens during an annual well check? The services and screenings done by your health care provider during your annual well check will depend on your age, overall health, lifestyle risk factors, and family history of disease. Counseling  Your health care provider may ask you  questions about your: Alcohol use. Tobacco use. Drug use. Emotional well-being. Home and relationship well-being. Sexual activity. Eating habits. History of falls. Memory and ability to understand (cognition). Work and work astronomer. Reproductive health. Screening   You may have the following tests or measurements: Height, weight, and BMI. Blood pressure. Lipid and cholesterol levels. These may be checked every 5 years, or more frequently if you are over 52 years old. Skin check. Lung cancer screening. You may have this screening every year starting at age 87 if you have a 30-pack-year history of smoking and currently smoke or have quit within the past 15 years. Fecal occult blood test (FOBT) of the stool. You may have this test every year starting at age 12. Flexible sigmoidoscopy or colonoscopy. You may have a sigmoidoscopy every 5 years or a colonoscopy every 10 years starting at age 64. Hepatitis C blood test. Hepatitis B blood test. Sexually transmitted disease (STD) testing. Diabetes screening. This is done by checking your blood sugar (glucose) after you have not eaten for a while (fasting). You may have this done every 1-3 years. Bone density scan. This is done to screen for osteoporosis. You may have this done starting at age 39. Mammogram. This may be done every 1-2 years. Talk to your health care provider about how often you should have regular mammograms. Talk with your health care provider about your test results, treatment options, and if necessary, the need for more tests. Vaccines  Your health care provider may recommend certain vaccines, such as: Influenza vaccine. This is recommended every year. Tetanus, diphtheria, and acellular pertussis (Tdap, Td) vaccine. You may need a Td booster every 10 years. Zoster vaccine. You may need this after age 20. Pneumococcal 13-valent conjugate (PCV13) vaccine. One dose is recommended after age 16. Pneumococcal polysaccharide (PPSV23) vaccine. One dose is recommended after age 49. Talk to your health care provider about which screenings and vaccines you need and how often you need them. This information is not intended to replace advice given to you by your health care provider. Make sure you discuss  any questions you have with your health care provider. Document Released: 06/12/2015 Document Revised: 02/03/2016 Document Reviewed: 03/17/2015 Elsevier Interactive Patient Education  2017 [employer].  Fall Prevention in the Home Falls can cause injuries. They can happen to people of all ages. There are many things you can do to make your home safe and to help prevent falls. What can I do on the outside of my home? Regularly fix the edges of walkways and driveways and fix any cracks. Remove anything that might make you trip as you walk through a door, such as a raised step or threshold. Trim any bushes or trees on the path to your home. Use bright outdoor lighting. Clear any walking paths of anything that might make someone trip, such as rocks or tools. Regularly check to see if handrails are loose or broken. Make sure that both sides of any steps have handrails. Any raised decks and porches should have guardrails on the edges. Have any leaves, snow, or ice cleared regularly. Use sand or salt on walking paths during winter. Clean up any spills in your garage right away. This includes oil or grease spills. What can I do in the bathroom? Use night lights. Install grab bars by the toilet and in the tub and shower. Do not use towel bars as grab bars. Use non-skid mats or decals in the tub or shower. If you need to sit  down in the shower, use a plastic, non-slip stool. Keep the floor dry. Clean up any water  that spills on the floor as soon as it happens. Remove soap buildup in the tub or shower regularly. Attach bath mats securely with double-sided non-slip rug tape. Do not have throw rugs and other things on the floor that can make you trip. What can I do in the bedroom? Use night lights. Make sure that you have a light by your bed that is easy to reach. Do not use any sheets or blankets that are too big for your bed. They should not hang down onto the floor. Have a firm chair that has  side arms. You can use this for support while you get dressed. Do not have throw rugs and other things on the floor that can make you trip. What can I do in the kitchen? Clean up any spills right away. Avoid walking on wet floors. Keep items that you use a lot in easy-to-reach places. If you need to reach something above you, use a strong step stool that has a grab bar. Keep electrical cords out of the way. Do not use floor polish or wax that makes floors slippery. If you must use wax, use non-skid floor wax. Do not have throw rugs and other things on the floor that can make you trip. What can I do with my stairs? Do not leave any items on the stairs. Make sure that there are handrails on both sides of the stairs and use them. Fix handrails that are broken or loose. Make sure that handrails are as long as the stairways. Check any carpeting to make sure that it is firmly attached to the stairs. Fix any carpet that is loose or worn. Avoid having throw rugs at the top or bottom of the stairs. If you do have throw rugs, attach them to the floor with carpet tape. Make sure that you have a light switch at the top of the stairs and the bottom of the stairs. If you do not have them, ask someone to add them for you. What else can I do to help prevent falls? Wear shoes that: Do not have high heels. Have rubber bottoms. Are comfortable and fit you well. Are closed at the toe. Do not wear sandals. If you use a stepladder: Make sure that it is fully opened. Do not climb a closed stepladder. Make sure that both sides of the stepladder are locked into place. Ask someone to hold it for you, if possible. Clearly mark and make sure that you can see: Any grab bars or handrails. First and last steps. Where the edge of each step is. Use tools that help you move around (mobility aids) if they are needed. These include: Canes. Walkers. Scooters. Crutches. Turn on the lights when you go into a dark area.  Replace any light bulbs as soon as they burn out. Set up your furniture so you have a clear path. Avoid moving your furniture around. If any of your floors are uneven, fix them. If there are any pets around you, be aware of where they are. Review your medicines with your doctor. Some medicines can make you feel dizzy. This can increase your chance of falling. Ask your doctor what other things that you can do to help prevent falls. This information is not intended to replace advice given to you by your health care provider. Make sure you discuss any questions you have with your health care provider.  Document Released: 03/12/2009 Document Revised: 10/22/2015 Document Reviewed: 06/20/2014 Elsevier Interactive Patient Education  2017 [employer].

## 2024-07-01 ENCOUNTER — Other Ambulatory Visit: Payer: Self-pay

## 2024-07-01 MED ORDER — APIXABAN 5 MG PO TABS: 5.0000 mg | ORAL_TABLET | Freq: Two times a day (BID) | ORAL | 2 refills | Status: AC

## 2024-07-01 NOTE — Telephone Encounter (Signed)
 Copied from CRM [id number]. Topic: Clinical - Medication Refill >> Jul 01, 2024 10:12 AM Alfonso ORN wrote: Medication:  apixaban  (ELIQUIS ) 5 MG TABS tablet  Has the patient contacted their pharmacy? Yes  This is the patient's preferred pharmacy:  [facility] [address] [place] - [address] [place] [address] Phone: [phone] Fax: [phone]  Is this the correct pharmacy for this prescription? Yes If no, delete pharmacy and type the correct one.   Has the prescription been filled recently? Yes  Is the patient out of the medication? Yes  Has the patient been seen for an appointment in the last year OR does the patient have an upcoming appointment? Yes  Can we respond through MyChart? No

## 2024-07-01 NOTE — Telephone Encounter (Signed)
 Requested Prescriptions   Pending Prescriptions Disp Refills   apixaban  (ELIQUIS ) 5 MG TABS tablet 60 tablet 2    Sig: Take 1 tablet (5 mg total) by mouth 2 (two) times daily.     Date of patient request: 07/01/2024 Last office visit: 06/04/2024 Upcoming visit: 09/02/2024 Date of last refill: 04/02/2024 Last refill amount: 60  Currently prescribed by Santosh Sigdel, MD

## 2024-07-30 ENCOUNTER — Ambulatory Visit: Admitting: Physician Assistant

## 2024-08-12 ENCOUNTER — Ambulatory Visit: Admitting: Student in an Organized Health Care Education/Training Program

## 2024-09-02 ENCOUNTER — Ambulatory Visit: Admitting: Student in an Organized Health Care Education/Training Program
